# Patient Record
Sex: Male | Born: 1935 | Race: White | Hispanic: No | Smoking: Former smoker
Health system: Southern US, Community
[De-identification: ages and names within clinical notes are randomized; demographics above are authoritative.]

## PROBLEM LIST (undated history)

## (undated) DIAGNOSIS — I10 Essential (primary) hypertension: Secondary | ICD-10-CM

## (undated) DIAGNOSIS — K219 Gastro-esophageal reflux disease without esophagitis: Secondary | ICD-10-CM

## (undated) DIAGNOSIS — K5900 Constipation, unspecified: Secondary | ICD-10-CM

## (undated) DIAGNOSIS — N400 Enlarged prostate without lower urinary tract symptoms: Secondary | ICD-10-CM

## (undated) DIAGNOSIS — E119 Type 2 diabetes mellitus without complications: Secondary | ICD-10-CM

## (undated) HISTORY — DX: Type 2 diabetes mellitus without complications: E11.9

## (undated) HISTORY — DX: Essential (primary) hypertension: I10

## (undated) HISTORY — PX: HERNIA REPAIR: SHX51

## (undated) HISTORY — DX: Benign prostatic hyperplasia without lower urinary tract symptoms: N40.0

## (undated) HISTORY — DX: Constipation, unspecified: K59.00

## (undated) HISTORY — PX: HIP SURGERY: SHX245

## (undated) HISTORY — DX: Gastro-esophageal reflux disease without esophagitis: K21.9

---

## 2013-05-02 ENCOUNTER — Encounter: Payer: Self-pay | Admitting: Internal Medicine

## 2013-05-03 ENCOUNTER — Non-Acute Institutional Stay (SKILLED_NURSING_FACILITY): Payer: Medicare Other | Admitting: Nurse Practitioner

## 2013-05-03 ENCOUNTER — Encounter: Payer: Self-pay | Admitting: Nurse Practitioner

## 2013-05-03 DIAGNOSIS — J111 Influenza due to unidentified influenza virus with other respiratory manifestations: Secondary | ICD-10-CM | POA: Insufficient documentation

## 2013-05-03 DIAGNOSIS — R1310 Dysphagia, unspecified: Secondary | ICD-10-CM

## 2013-05-03 DIAGNOSIS — B952 Enterococcus as the cause of diseases classified elsewhere: Secondary | ICD-10-CM

## 2013-05-03 DIAGNOSIS — I110 Hypertensive heart disease with heart failure: Secondary | ICD-10-CM | POA: Insufficient documentation

## 2013-05-03 DIAGNOSIS — N400 Enlarged prostate without lower urinary tract symptoms: Secondary | ICD-10-CM

## 2013-05-03 DIAGNOSIS — R404 Transient alteration of awareness: Secondary | ICD-10-CM

## 2013-05-03 DIAGNOSIS — N179 Acute kidney failure, unspecified: Secondary | ICD-10-CM

## 2013-05-03 DIAGNOSIS — E119 Type 2 diabetes mellitus without complications: Secondary | ICD-10-CM

## 2013-05-03 DIAGNOSIS — K5900 Constipation, unspecified: Secondary | ICD-10-CM | POA: Insufficient documentation

## 2013-05-03 DIAGNOSIS — R7881 Bacteremia: Secondary | ICD-10-CM | POA: Insufficient documentation

## 2013-05-03 DIAGNOSIS — I10 Essential (primary) hypertension: Secondary | ICD-10-CM

## 2013-05-03 DIAGNOSIS — R400 Somnolence: Secondary | ICD-10-CM

## 2013-05-03 DIAGNOSIS — K219 Gastro-esophageal reflux disease without esophagitis: Secondary | ICD-10-CM

## 2013-05-03 DIAGNOSIS — J189 Pneumonia, unspecified organism: Secondary | ICD-10-CM

## 2013-05-03 DIAGNOSIS — N390 Urinary tract infection, site not specified: Secondary | ICD-10-CM

## 2013-05-03 NOTE — Progress Notes (Signed)
Patient ID: Riley Martinez, male   DOB: 1914-06-10, 80 y.o.   MRN: [id number]    Nursing Home Location:  [facility]   Place of Service: SNF (31)  PCP: No primary provider on file.  Not on File  Chief Complaint  Patient presents with  . Hospitalization Follow-up    HPI:  -80 year old male with a PMH of HTN, BPH (s/p TURP) GERD, who was hospitalized for severe sepsis in the setting of enterococcus septicemia, influenza A, pneumonia and UTI; pt was followed by ID during hospitalization and is now receiving IV Unasyn until the 11th. Pt requiring Bipap after respiratory distress due to pulmonary edema and was treated with IV lasix. Noted that pt had increased somnolence after ativan during hospitalization, pt was previously on remeron and melatonin prior to hospitalization, hospital increase remeron at discharge and now  staff report pt have not been fully awake since he was admitted but stayed awake all night with only brief episodes of sleeping. Now staff concerns about pt being so sleepy. No fevers or chills Review of Systems:  Unable to obtain   Past Medical History  Diagnosis Date  . GERD (gastroesophageal reflux disease)   . Hypertension   . BPH (benign prostatic hyperplasia)   . Diabetes mellitus without complication   . Constipation    No past surgical history on file. Social History:   has no tobacco, alcohol, and drug history on file.  No family history on file.  Medications: Patient's Medications  New Prescriptions   No medications on file  Previous Medications   ACETAMINOPHEN (TYLENOL) 500 MG CHEWABLE TABLET    Chew 500 mg by mouth every 6 (six) hours as needed for pain.   ACETAMINOPHEN (TYLENOL) 500 MG TABLET    Take 500 mg by mouth 3 (three) times daily.   ALBUTEROL (PROVENTIL) (2.5 MG/3ML) 0.083% NEBULIZER SOLUTION    Take 2.5 mg by nebulization every 6 (six) hours as needed for wheezing or shortness of breath.   AMPICILLIN-SULBACTAM (UNASYN) 3  (2-1) G INJECTION    Inject 3 g into the vein every 6 (six) hours.   ASPIRIN 81 MG TABLET    Take 81 mg by mouth daily.   BISACODYL (DULCOLAX) 10 MG SUPPOSITORY    Place 10 mg rectally daily. For 9 days   CARBAMIDE PEROXIDE (DEBROX) 6.5 % OTIC SOLUTION    Place 5 drops into both ears as needed.   CARVEDILOL (COREG) 3.125 MG TABLET    Take 3.125 mg by mouth 2 (two) times daily with a meal.   CLOTRIMAZOLE (LOTRIMIN) 1 % CREAM    Apply 1 application topically 2 (two) times daily.   CLOTRIMAZOLE-BETAMETHASONE (LOTRISONE) CREAM    Apply 1 application topically daily.   DESONIDE (DESOWEN) 0.05 % LOTION    Apply topically daily. Monday - Thursday - to face and ears   FLUOCINONIDE CREAM (LIDEX) 0.05 %    Apply 1 application topically 2 (two) times daily. Monday through Thursday to legs   FLUTICASONE (FLONASE) 50 MCG/ACT NASAL SPRAY    Place 1 spray into both nostrils daily.   FUROSEMIDE (LASIX) 20 MG TABLET    Take 20 mg by mouth every other day.   LACTOBACILLUS ACIDOPHILUS (BACID) TABS TABLET    Take 2 tablets by mouth 3 (three) times daily.   LISINOPRIL (PRINIVIL,ZESTRIL) 2.5 MG TABLET    Take 2.5 mg by mouth daily.   MIRTAZAPINE (REMERON) 15 MG TABLET    Take 15 mg  by mouth at bedtime.   OMEPRAZOLE (PRILOSEC) 20 MG CAPSULE    Take 20 mg by mouth daily.   POLYETHYLENE GLYCOL (MIRALAX / GLYCOLAX) PACKET    Take 17 g by mouth 2 (two) times daily as needed.   POLYVINYL ALCOHOL (LIQUIFILM TEARS) 1.4 % OPHTHALMIC SOLUTION    Place 2 drops into both eyes 3 (three) times daily.   SENNOSIDES-DOCUSATE SODIUM (SENOKOT-S) 8.6-50 MG TABLET    Take 2 tablets by mouth 2 (two) times daily.   TAMSULOSIN (FLOMAX) 0.4 MG CAPS CAPSULE    Take 0.4 mg by mouth daily.   TRAMADOL (ULTRAM) 50 MG TABLET    Take by mouth every 8 (eight) hours as needed.   VITAMIN B-12 (CYANOCOBALAMIN) 1000 MCG TABLET    Take 1,000 mcg by mouth daily.  Modified Medications   No medications on file  Discontinued Medications   No medications  on file     Physical Exam: Physical Exam  Constitutional: No distress.  Frail male in NAD  HENT:  Mouth/Throat: Oropharynx is clear and moist. No oropharyngeal exudate.  Eyes: Conjunctivae and EOM are normal. Pupils are equal, round, and reactive to light.  Cardiovascular: Normal rate, regular rhythm and normal heart sounds.   Pulmonary/Chest: Effort normal. He has wheezes.  Diminished   Abdominal: Soft. Bowel sounds are normal. He exhibits no distension.  Musculoskeletal: He exhibits no edema and no tenderness.  Neurological:  Lethargic, opens eyes to name, does not follow commands  Skin: Skin is warm and dry.    Filed Vitals:   05/03/13 1421  BP: 96/47  Pulse: 76  Temp: 98.4 F (36.9 C)  Resp: 24   04/27/13 -wbc 8.2, hgb 9.8, hct 32.3, plt 142 04/30/13-sodium 143,potassium  4.1,Co2 30, Cr 0.80, BUN 17    cxr on 04/26/13- chronic lung changes with superimposed mild edema 05/01/13- stable cardiomegaly, mild decrease in infiltrates/edema Assessment/Plan 1. GERD (gastroesophageal reflux disease) -not currently on medications 2. Hypertension -blood pressure soft; will hold BP medications for sbp <100 3. BPH (benign prostatic hyperplasia) -on Flomax 4. Diabetes mellitus without complication -was previously on medications but due to poor PO intake medications was dc'd 5. Constipation -has not had regular BMs, was started on bowel regimen in hospital  6. Acute renal failure -Renal function stabilized before dc from hosital 7. Influenza -completed tamiflu  8. Dysphagia - Fees completed and honey thick liquids were recommended 9. Sepsis due to Enterococcus UTI and pneumonia  -pos blood cultures in hospital; was initially treated with ceftriaxone and vanc but  Narrowed to unaysn- ID was following in hospital- recs for 14 days of Unasyn; to be dc's on 05/08/13 10. Somnolence  -pt with metabolic encephalopathy in hospital due to age, dementia, and infections; now with increase  lethargy but after a night of being awake most of the time; Pt with sleep wake cycle being off per staff and family, did not sleep last night today he has been up but now is very sleepy, does not follow commands but opens eyes when spoken too and attempts to answer questions, son reports he was like this in hospital after 1 dose of ativan; has not received any benzos or narcotics since he has been here from staff, family has not given him any medication.  Family concerned because he is not sleeping at night;  But due to somnolence will stop remeron (this was 7.5 and now at 15 mg) and restart at a later date  -will get stat labs, and  cxray and UAC&S to rule out other causes for pt being lethargic; pt conts on IV antibiotics will start IV fluids NS at 50 ccs an hour for 24 hours and follow up BMP after this is completed due to minimal PO intake since he has been at [facility]  -pt also with decreased breath sounds, will start duonebs q 8 hours for 5 days  -VS q 4 -will stop ultram and scheduled tylenol -conts PRN tylenol   -sons at bedside who were updated and aware of plan of care

## 2013-05-05 ENCOUNTER — Emergency Department (HOSPITAL_COMMUNITY): Payer: Medicare Other

## 2013-05-05 ENCOUNTER — Encounter (HOSPITAL_COMMUNITY): Payer: Self-pay | Admitting: Emergency Medicine

## 2013-05-05 ENCOUNTER — Inpatient Hospital Stay (HOSPITAL_COMMUNITY)
Admission: EM | Admit: 2013-05-05 | Discharge: 2013-05-15 | DRG: 314 | Disposition: A | Discharge status: Skilled Nursing Facility | Payer: Medicare Other | Attending: Internal Medicine | Admitting: Internal Medicine

## 2013-05-05 DIAGNOSIS — R1310 Dysphagia, unspecified: Secondary | ICD-10-CM

## 2013-05-05 DIAGNOSIS — Z9181 History of falling: Secondary | ICD-10-CM

## 2013-05-05 DIAGNOSIS — K5900 Constipation, unspecified: Secondary | ICD-10-CM

## 2013-05-05 DIAGNOSIS — I5023 Acute on chronic systolic (congestive) heart failure: Secondary | ICD-10-CM | POA: Diagnosis present

## 2013-05-05 DIAGNOSIS — J111 Influenza due to unidentified influenza virus with other respiratory manifestations: Secondary | ICD-10-CM

## 2013-05-05 DIAGNOSIS — Z7982 Long term (current) use of aspirin: Secondary | ICD-10-CM

## 2013-05-05 DIAGNOSIS — A419 Sepsis, unspecified organism: Secondary | ICD-10-CM | POA: Insufficient documentation

## 2013-05-05 DIAGNOSIS — Y92009 Unspecified place in unspecified non-institutional (private) residence as the place of occurrence of the external cause: Secondary | ICD-10-CM

## 2013-05-05 DIAGNOSIS — J189 Pneumonia, unspecified organism: Secondary | ICD-10-CM | POA: Diagnosis present

## 2013-05-05 DIAGNOSIS — N138 Other obstructive and reflux uropathy: Secondary | ICD-10-CM | POA: Diagnosis present

## 2013-05-05 DIAGNOSIS — M545 Low back pain, unspecified: Secondary | ICD-10-CM

## 2013-05-05 DIAGNOSIS — Z87891 Personal history of nicotine dependence: Secondary | ICD-10-CM

## 2013-05-05 DIAGNOSIS — Y849 Medical procedure, unspecified as the cause of abnormal reaction of the patient, or of later complication, without mention of misadventure at the time of the procedure: Secondary | ICD-10-CM | POA: Diagnosis present

## 2013-05-05 DIAGNOSIS — T80211A Bloodstream infection due to central venous catheter, initial encounter: Principal | ICD-10-CM | POA: Diagnosis present

## 2013-05-05 DIAGNOSIS — R7881 Bacteremia: Secondary | ICD-10-CM

## 2013-05-05 DIAGNOSIS — N401 Enlarged prostate with lower urinary tract symptoms: Secondary | ICD-10-CM | POA: Diagnosis present

## 2013-05-05 DIAGNOSIS — G9340 Encephalopathy, unspecified: Secondary | ICD-10-CM | POA: Diagnosis present

## 2013-05-05 DIAGNOSIS — E870 Hyperosmolality and hypernatremia: Secondary | ICD-10-CM

## 2013-05-05 DIAGNOSIS — I509 Heart failure, unspecified: Secondary | ICD-10-CM | POA: Diagnosis present

## 2013-05-05 DIAGNOSIS — N390 Urinary tract infection, site not specified: Secondary | ICD-10-CM

## 2013-05-05 DIAGNOSIS — R339 Retention of urine, unspecified: Secondary | ICD-10-CM | POA: Diagnosis present

## 2013-05-05 DIAGNOSIS — K219 Gastro-esophageal reflux disease without esophagitis: Secondary | ICD-10-CM

## 2013-05-05 DIAGNOSIS — E876 Hypokalemia: Secondary | ICD-10-CM | POA: Diagnosis present

## 2013-05-05 DIAGNOSIS — R52 Pain, unspecified: Secondary | ICD-10-CM | POA: Diagnosis present

## 2013-05-05 DIAGNOSIS — B952 Enterococcus as the cause of diseases classified elsewhere: Secondary | ICD-10-CM

## 2013-05-05 DIAGNOSIS — I10 Essential (primary) hypertension: Secondary | ICD-10-CM

## 2013-05-05 DIAGNOSIS — Z66 Do not resuscitate: Secondary | ICD-10-CM | POA: Diagnosis present

## 2013-05-05 DIAGNOSIS — E119 Type 2 diabetes mellitus without complications: Secondary | ICD-10-CM

## 2013-05-05 DIAGNOSIS — Z792 Long term (current) use of antibiotics: Secondary | ICD-10-CM

## 2013-05-05 DIAGNOSIS — N400 Enlarged prostate without lower urinary tract symptoms: Secondary | ICD-10-CM

## 2013-05-05 DIAGNOSIS — B49 Unspecified mycosis: Secondary | ICD-10-CM

## 2013-05-05 DIAGNOSIS — Z7401 Bed confinement status: Secondary | ICD-10-CM

## 2013-05-05 DIAGNOSIS — D649 Anemia, unspecified: Secondary | ICD-10-CM | POA: Diagnosis present

## 2013-05-05 DIAGNOSIS — R509 Fever, unspecified: Secondary | ICD-10-CM | POA: Diagnosis present

## 2013-05-05 LAB — URINALYSIS, ROUTINE W REFLEX MICROSCOPIC
BILIRUBIN URINE: NEGATIVE
Glucose, UA: NEGATIVE mg/dL
KETONES UR: 15 mg/dL — AB
NITRITE: NEGATIVE
PH: 5.5 (ref 5.0–8.0)
Protein, ur: 30 mg/dL — AB
Specific Gravity, Urine: 1.018 (ref 1.005–1.030)
Urobilinogen, UA: 0.2 mg/dL (ref 0.0–1.0)

## 2013-05-05 LAB — URINE MICROSCOPIC-ADD ON

## 2013-05-05 LAB — CBC WITH DIFFERENTIAL/PLATELET
Basophils Absolute: 0 10*3/uL (ref 0.0–0.1)
Basophils Relative: 0 % (ref 0–1)
EOS ABS: 0 10*3/uL (ref 0.0–0.7)
Eosinophils Relative: 1 % (ref 0–5)
HCT: 25.7 % — ABNORMAL LOW (ref 39.0–52.0)
HEMOGLOBIN: 8.1 g/dL — AB (ref 13.0–17.0)
LYMPHS ABS: 0.9 10*3/uL (ref 0.7–4.0)
Lymphocytes Relative: 10 % — ABNORMAL LOW (ref 12–46)
MCH: 21.2 pg — AB (ref 26.0–34.0)
MCHC: 31.5 g/dL (ref 30.0–36.0)
MCV: 67.3 fL — ABNORMAL LOW (ref 78.0–100.0)
MONOS PCT: 6 % (ref 3–12)
Monocytes Absolute: 0.6 10*3/uL (ref 0.1–1.0)
Neutro Abs: 7.3 10*3/uL (ref 1.7–7.7)
Neutrophils Relative %: 83 % — ABNORMAL HIGH (ref 43–77)
Platelets: 252 10*3/uL (ref 150–400)
RBC: 3.82 MIL/uL — AB (ref 4.22–5.81)
RDW: 14.9 % (ref 11.5–15.5)
WBC: 8.7 10*3/uL (ref 4.0–10.5)

## 2013-05-05 LAB — COMPREHENSIVE METABOLIC PANEL
ALBUMIN: 2.3 g/dL — AB (ref 3.5–5.2)
ALK PHOS: 34 U/L — AB (ref 39–117)
ALT: 14 U/L (ref 0–53)
AST: 11 U/L (ref 0–37)
BILIRUBIN TOTAL: 0.8 mg/dL (ref 0.3–1.2)
BUN: 36 mg/dL — ABNORMAL HIGH (ref 6–23)
CHLORIDE: 109 meq/L (ref 96–112)
CO2: 24 meq/L (ref 19–32)
Calcium: 7.9 mg/dL — ABNORMAL LOW (ref 8.4–10.5)
Creatinine, Ser: 1.28 mg/dL (ref 0.50–1.35)
GFR calc non Af Amer: 45 mL/min — ABNORMAL LOW (ref >90)
GFR, EST AFRICAN AMERICAN: 53 mL/min — AB (ref >90)
Glucose, Bld: 132 mg/dL — ABNORMAL HIGH (ref 70–99)
POTASSIUM: 3.1 meq/L — AB (ref 3.7–5.3)
Sodium: 150 mEq/L — ABNORMAL HIGH (ref 137–147)
Total Protein: 6.1 g/dL (ref 6.0–8.3)

## 2013-05-05 LAB — LACTIC ACID, PLASMA: Lactic Acid, Venous: 1 mmol/L (ref 0.5–2.2)

## 2013-05-05 LAB — GLUCOSE, CAPILLARY: Glucose-Capillary: 128 mg/dL — ABNORMAL HIGH (ref 70–99)

## 2013-05-05 MED ORDER — TRAMADOL HCL 50 MG PO TABS
50.0000 mg | ORAL_TABLET | Freq: Three times a day (TID) | ORAL | Status: DC | PRN
  Administered 2013-05-06 – 2013-05-09 (×6): 50 mg via ORAL
  Filled 2013-05-05 (×10): qty 1

## 2013-05-05 MED ORDER — SODIUM CHLORIDE 0.9 % IJ SOLN
10.0000 mL | INTRAMUSCULAR | Status: DC | PRN
Start: 2013-05-05 — End: 2013-05-15
  Administered 2013-05-06: 10 mL
  Administered 2013-05-06 – 2013-05-07 (×3): 20 mL
  Administered 2013-05-08 – 2013-05-09 (×2): 10 mL

## 2013-05-05 MED ORDER — PANTOPRAZOLE SODIUM 40 MG PO TBEC
40.0000 mg | DELAYED_RELEASE_TABLET | Freq: Every day | ORAL | Status: DC
Start: 2013-05-06 — End: 2013-05-09
  Administered 2013-05-06 – 2013-05-09 (×4): 40 mg via ORAL
  Filled 2013-05-05 (×4): qty 1

## 2013-05-05 MED ORDER — ACETAMINOPHEN 325 MG PO TABS
650.0000 mg | ORAL_TABLET | Freq: Four times a day (QID) | ORAL | Status: DC | PRN
Start: 2013-05-05 — End: 2013-05-15
  Administered 2013-05-06 – 2013-05-12 (×8): 650 mg via ORAL
  Filled 2013-05-05 (×10): qty 2

## 2013-05-05 MED ORDER — SODIUM CHLORIDE 0.9 % IV BOLUS (SEPSIS)
500.0000 mL | Freq: Once | INTRAVENOUS | Status: AC
  Administered 2013-05-05: 500 mL via INTRAVENOUS

## 2013-05-05 MED ORDER — POLYVINYL ALCOHOL 1.4 % OP SOLN
2.0000 [drp] | Freq: Three times a day (TID) | OPHTHALMIC | Status: DC
  Administered 2013-05-06 – 2013-05-15 (×26): 2 [drp] via OPHTHALMIC
  Filled 2013-05-05 (×2): qty 15

## 2013-05-05 MED ORDER — DEXTROSE 5 % IV SOLN: 2.0000 g | INTRAVENOUS | Status: DC

## 2013-05-05 MED ORDER — PIPERACILLIN-TAZOBACTAM 3.375 G IVPB
3.3750 g | Freq: Three times a day (TID) | INTRAVENOUS | Status: DC
  Administered 2013-05-06 – 2013-05-07 (×5): 3.375 g via INTRAVENOUS
  Filled 2013-05-05 (×7): qty 50

## 2013-05-05 MED ORDER — MAGNESIUM SULFATE 40 MG/ML IJ SOLN: 2.0000 g | Freq: Once | INTRAMUSCULAR | Status: DC

## 2013-05-05 MED ORDER — CLOTRIMAZOLE 1 % EX CREA
1.0000 "application " | TOPICAL_CREAM | Freq: Two times a day (BID) | CUTANEOUS | Status: DC
  Administered 2013-05-06 – 2013-05-15 (×19): 1 via TOPICAL
  Filled 2013-05-05: qty 15

## 2013-05-05 MED ORDER — ACETAMINOPHEN 650 MG RE SUPP
650.0000 mg | Freq: Four times a day (QID) | RECTAL | Status: DC | PRN
Start: 2013-05-05 — End: 2013-05-14
  Administered 2013-05-06 – 2013-05-09 (×2): 650 mg via RECTAL
  Filled 2013-05-05 (×2): qty 1

## 2013-05-05 MED ORDER — FLUOCINONIDE 0.05 % EX CREA
1.0000 "application " | TOPICAL_CREAM | CUTANEOUS | Status: DC
  Filled 2013-05-05: qty 30

## 2013-05-05 MED ORDER — VANCOMYCIN HCL IN DEXTROSE 750-5 MG/150ML-% IV SOLN
750.0000 mg | INTRAVENOUS | Status: DC
  Administered 2013-05-06: 750 mg via INTRAVENOUS
  Filled 2013-05-05 (×2): qty 150

## 2013-05-05 MED ORDER — SENNA-DOCUSATE SODIUM 8.6-50 MG PO TABS
2.0000 | ORAL_TABLET | Freq: Two times a day (BID) | ORAL | Status: DC
  Administered 2013-05-06 – 2013-05-14 (×15): 2 via ORAL
  Filled 2013-05-05 (×21): qty 2

## 2013-05-05 MED ORDER — POTASSIUM CHLORIDE IN NACL 20-0.45 MEQ/L-% IV SOLN
INTRAVENOUS | Status: DC
  Administered 2013-05-05: via INTRAVENOUS
  Filled 2013-05-05 (×2): qty 1000

## 2013-05-05 MED ORDER — VITAMIN B-12 1000 MCG PO TABS
1000.0000 ug | ORAL_TABLET | Freq: Every day | ORAL | Status: DC
  Administered 2013-05-06 – 2013-05-15 (×10): 1000 ug via ORAL
  Filled 2013-05-05 (×10): qty 1

## 2013-05-05 MED ORDER — SODIUM CHLORIDE 0.9 % IJ SOLN
3.0000 mL | Freq: Two times a day (BID) | INTRAMUSCULAR | Status: DC
  Administered 2013-05-06 – 2013-05-14 (×6): 3 mL via INTRAVENOUS

## 2013-05-05 MED ORDER — POLYETHYLENE GLYCOL 3350 17 G PO PACK
17.0000 g | PACK | Freq: Every day | ORAL | Status: DC | PRN
  Administered 2013-05-06: 17 g via ORAL
  Filled 2013-05-05: qty 1

## 2013-05-05 MED ORDER — ASPIRIN 81 MG PO TABS: 81.0000 mg | ORAL_TABLET | Freq: Every day | ORAL | Status: DC

## 2013-05-05 MED ORDER — ENOXAPARIN SODIUM 40 MG/0.4ML ~~LOC~~ SOLN
40.0000 mg | SUBCUTANEOUS | Status: DC
  Administered 2013-05-06 – 2013-05-15 (×10): 40 mg via SUBCUTANEOUS
  Filled 2013-05-05 (×10): qty 0.4

## 2013-05-05 MED ORDER — ASPIRIN EC 81 MG PO TBEC
81.0000 mg | DELAYED_RELEASE_TABLET | Freq: Every day | ORAL | Status: DC
  Administered 2013-05-06 – 2013-05-15 (×10): 81 mg via ORAL
  Filled 2013-05-05 (×10): qty 1

## 2013-05-05 MED ORDER — ACETAMINOPHEN 650 MG RE SUPP
650.0000 mg | Freq: Once | RECTAL | Status: AC
  Administered 2013-05-05: 650 mg via RECTAL
  Filled 2013-05-05: qty 1

## 2013-05-05 MED ORDER — VANCOMYCIN HCL 10 G IV SOLR
1500.0000 mg | Freq: Once | INTRAVENOUS | Status: AC
  Administered 2013-05-05: 1500 mg via INTRAVENOUS
  Filled 2013-05-05: qty 1500

## 2013-05-05 MED ORDER — ALBUTEROL SULFATE (2.5 MG/3ML) 0.083% IN NEBU: 2.5000 mg | INHALATION_SOLUTION | Freq: Three times a day (TID) | RESPIRATORY_TRACT | Status: DC | PRN

## 2013-05-05 MED ORDER — FLUTICASONE PROPIONATE 50 MCG/ACT NA SUSP
1.0000 | Freq: Every day | NASAL | Status: DC
  Administered 2013-05-06 – 2013-05-15 (×10): 1 via NASAL
  Filled 2013-05-05: qty 16

## 2013-05-05 MED ORDER — ONDANSETRON HCL 4 MG PO TABS: 4.0000 mg | ORAL_TABLET | Freq: Four times a day (QID) | ORAL | Status: DC | PRN

## 2013-05-05 MED ORDER — DEXTROSE 5 % IV SOLN
2.0000 g | Freq: Once | INTRAVENOUS | Status: AC
  Administered 2013-05-05: 2 g via INTRAVENOUS

## 2013-05-05 MED ORDER — BACID PO TABS
1.0000 | ORAL_TABLET | Freq: Three times a day (TID) | ORAL | Status: DC
  Administered 2013-05-06 – 2013-05-15 (×27): 1 via ORAL
  Filled 2013-05-05 (×31): qty 1

## 2013-05-05 MED ORDER — INSULIN ASPART 100 UNIT/ML ~~LOC~~ SOLN
0.0000 [IU] | Freq: Three times a day (TID) | SUBCUTANEOUS | Status: DC
  Administered 2013-05-06 – 2013-05-07 (×3): 1 [IU] via SUBCUTANEOUS
  Administered 2013-05-07: 2 [IU] via SUBCUTANEOUS
  Administered 2013-05-07: 1 [IU] via SUBCUTANEOUS
  Administered 2013-05-08: 2 [IU] via SUBCUTANEOUS
  Administered 2013-05-09 (×2): 1 [IU] via SUBCUTANEOUS
  Administered 2013-05-09: 2 [IU] via SUBCUTANEOUS
  Administered 2013-05-11 – 2013-05-13 (×3): 1 [IU] via SUBCUTANEOUS

## 2013-05-05 MED ORDER — ONDANSETRON HCL 4 MG/2ML IJ SOLN
4.0000 mg | Freq: Four times a day (QID) | INTRAMUSCULAR | Status: DC | PRN
  Administered 2013-05-13 – 2013-05-15 (×2): 4 mg via INTRAVENOUS
  Filled 2013-05-05 (×2): qty 2

## 2013-05-05 MED ORDER — FLUOCINONIDE-E 0.05 % EX CREA
TOPICAL_CREAM | CUTANEOUS | Status: DC
  Administered 2013-05-06 – 2013-05-15 (×13): via TOPICAL
  Filled 2013-05-05: qty 15

## 2013-05-05 MED ORDER — TAMSULOSIN HCL 0.4 MG PO CAPS
0.4000 mg | ORAL_CAPSULE | Freq: Every day | ORAL | Status: DC
  Administered 2013-05-06 – 2013-05-08 (×3): 0.4 mg via ORAL
  Filled 2013-05-05 (×6): qty 1

## 2013-05-05 NOTE — ED Notes (Signed)
MD at bedside. 

## 2013-05-05 NOTE — ED Provider Notes (Signed)
CSN: [id number]     Arrival date & time 05/05/13  1718 History   First MD Initiated Contact with Patient 05/05/13 1734     Chief Complaint  Patient presents with  . Altered Mental Status    HPI: Riley Martinez is a 80 yo M with history of CHF, HTN, DM, recently discharged from [facility], after being treated for enterococcus bacteremia, who presents with alerted mental status. He was admitted to [facility] for sepsis, ultimately discovered to be enterococcus, he is currently on IV unasyn through a R UE PICC line. He was also diagnosed with influenza A and completed a course of Tamiflu. His hospital course was complicated by volume overload requiring BiPAP and ICU admission but no intubation. He has been at the rehab facility for three days. Since yesterday he has not been eating or drinking fluids normally. Today he was less responsive than normal, noted to have fever to 101 so he was sent to the ED for evaluation. His family has not noted any vomiting, diarrhea, coughing or trouble breathing. He does have chronic bilateral LE redness, worse on right than left.    Past Medical History  Diagnosis Date  . GERD (gastroesophageal reflux disease)   . Hypertension   . BPH (benign prostatic hyperplasia)   . Diabetes mellitus without complication   . Constipation    History reviewed. No pertinent past surgical history. History reviewed. No pertinent family history. History  Substance Use Topics  . Smoking status: Not on file  . Smokeless tobacco: Not on file  . Alcohol Use: No    Review of Systems  Unable to perform ROS: Mental status change    Allergies  Ativan  Home Medications   Current Outpatient Rx  Name  Route  Sig  Dispense  Refill  . acetaminophen (TYLENOL) 500 MG chewable tablet   Oral   Chew 500 mg by mouth every 6 (six) hours as needed for pain.         Marland Kitchen acetaminophen (TYLENOL) 500 MG tablet   Oral   Take 500 mg by mouth 3 (three) times daily.         Marland Kitchen albuterol  (PROVENTIL) (2.5 MG/3ML) 0.083% nebulizer solution   Nebulization   Take 2.5 mg by nebulization every 6 (six) hours as needed for wheezing or shortness of breath.         Marland Kitchen ampicillin-sulbactam (UNASYN) 3 (2-1) G injection   Intravenous   Inject 3 g into the vein every 6 (six) hours.         Marland Kitchen aspirin 81 MG tablet   Oral   Take 81 mg by mouth daily.         . bisacodyl (DULCOLAX) 10 MG suppository   Rectal   Place 10 mg rectally daily. For 9 days         . carbamide peroxide (DEBROX) 6.5 % otic solution   Both Ears   Place 5 drops into both ears as needed.         . carvedilol (COREG) 3.125 MG tablet   Oral   Take 3.125 mg by mouth 2 (two) times daily with a meal.         . clotrimazole (LOTRIMIN) 1 % cream   Topical   Apply 1 application topically 2 (two) times daily.         . clotrimazole-betamethasone (LOTRISONE) cream   Topical   Apply 1 application topically daily.         Marland Kitchen  desonide (DESOWEN) 0.05 % lotion   Topical   Apply topically daily. Monday - Thursday - to face and ears         . fluocinonide cream (LIDEX) 0.05 %   Topical   Apply 1 application topically 2 (two) times daily. Monday through Thursday to legs         . fluticasone (FLONASE) 50 MCG/ACT nasal spray   Each Nare   Place 1 spray into both nostrils daily.         . furosemide (LASIX) 20 MG tablet   Oral   Take 20 mg by mouth every other day.         . lactobacillus acidophilus (BACID) TABS tablet   Oral   Take 2 tablets by mouth 3 (three) times daily.         Marland Kitchen lisinopril (PRINIVIL,ZESTRIL) 2.5 MG tablet   Oral   Take 2.5 mg by mouth daily.         . mirtazapine (REMERON) 15 MG tablet   Oral   Take 15 mg by mouth at bedtime.         Marland Kitchen omeprazole (PRILOSEC) 20 MG capsule   Oral   Take 20 mg by mouth daily.         . polyethylene glycol (MIRALAX / GLYCOLAX) packet   Oral   Take 17 g by mouth 2 (two) times daily as needed.         . polyvinyl  alcohol (LIQUIFILM TEARS) 1.4 % ophthalmic solution   Both Eyes   Place 2 drops into both eyes 3 (three) times daily.         . sennosides-docusate sodium (SENOKOT-S) 8.6-50 MG tablet   Oral   Take 2 tablets by mouth 2 (two) times daily.         . tamsulosin (FLOMAX) 0.4 MG CAPS capsule   Oral   Take 0.4 mg by mouth daily.         . traMADol (ULTRAM) 50 MG tablet   Oral   Take by mouth every 8 (eight) hours as needed.         . vitamin B-12 (CYANOCOBALAMIN) 1000 MCG tablet   Oral   Take 1,000 mcg by mouth daily.          BP 114/45  Pulse 76  Resp 22  SpO2 93% Physical Exam  Nursing note and vitals reviewed. Constitutional: He appears lethargic. He appears ill. No distress.  Chronically ill appearing, elderly male, laying in bed, somnolent but arouses to voice.   HENT:  Head: Normocephalic and atraumatic.  Mouth/Throat: Oropharynx is clear and moist. Mucous membranes are dry.  Eyes: Conjunctivae and EOM are normal. Pupils are equal, round, and reactive to light.  Neck: Normal range of motion. Neck supple.  Cardiovascular: Normal rate, regular rhythm and intact distal pulses.  Exam reveals distant heart sounds.   Pulmonary/Chest: Effort normal. No respiratory distress. He has rales (bilateral bases).  Abdominal: Soft. Bowel sounds are normal. There is no tenderness. There is no rebound and no guarding.  Musculoskeletal: Normal range of motion. He exhibits no edema and no tenderness.  Chronic redness of right lower extremity. Not warm or tender. NO surrounding swelling  PICC line in R UE. No surrounding erythema or drainage.   Neurological: He appears lethargic. He is disoriented. GCS eye subscore is 4. GCS verbal subscore is 4. GCS motor subscore is 6.  Neurologic exam limited by mental status. Despite these limitations, no gross  neurologic deficit appreciated. He moves all extremities. Symmetric smile.   Skin: Skin is warm and dry. No rash noted.    ED Course   Procedures (including critical care time) Labs Review Labs Reviewed  CBC WITH DIFFERENTIAL - Abnormal; Notable for the following:    RBC 3.82 (*)    Hemoglobin 8.1 (*)    HCT 25.7 (*)    MCV 67.3 (*)    MCH 21.2 (*)    Neutrophils Relative % 83 (*)    Lymphocytes Relative 10 (*)    All other components within normal limits  COMPREHENSIVE METABOLIC PANEL - Abnormal; Notable for the following:    Sodium 150 (*)    Potassium 3.1 (*)    Glucose, Bld 132 (*)    BUN 36 (*)    Calcium 7.9 (*)    Albumin 2.3 (*)    Alkaline Phosphatase 34 (*)    GFR calc non Af Amer 45 (*)    GFR calc Af Amer 53 (*)    All other components within normal limits  URINALYSIS, ROUTINE W REFLEX MICROSCOPIC - Abnormal; Notable for the following:    APPearance CLOUDY (*)    Hgb urine dipstick LARGE (*)    Ketones, ur 15 (*)    Protein, ur 30 (*)    Leukocytes, UA SMALL (*)    All other components within normal limits  URINE MICROSCOPIC-ADD ON - Abnormal; Notable for the following:    Bacteria, UA FEW (*)    All other components within normal limits  URINE CULTURE  CULTURE, BLOOD (ROUTINE X 2)  CULTURE, BLOOD (ROUTINE X 2)  LACTIC ACID, PLASMA  INFLUENZA PANEL BY PCR (TYPE A & B, H1N1)   Imaging Review Ct Head Wo Contrast  05/05/2013   CLINICAL DATA:  Altered mental status.  EXAM: CT HEAD WITHOUT CONTRAST  TECHNIQUE: Contiguous axial images were obtained from the base of the skull through the vertex without intravenous contrast.  COMPARISON:  None.  FINDINGS: There is atrophy and chronic small vessel disease changes. Small old lacunar infarct in the right basal ganglia. No acute intracranial abnormality. Specifically, no hemorrhage, hydrocephalus, mass lesion, acute infarction, or significant intracranial injury. No acute calvarial abnormality.  Mucosal thickening within the ethmoid air cells and nasal cavity. Mastoid air cells are clear. Orbital soft tissues are unremarkable.  IMPRESSION: No acute  intracranial abnormality.  Atrophy, chronic microvascular disease.   Electronically Signed   By: Charlett Nose M.D.   On: 05/05/2013 20:27   Dg Chest Portable 1 View  05/05/2013   CLINICAL DATA:  Altered mental status since 2 o'clock today. Fever. Sepsis last week. CHF. Hypertension. Diabetes.  EXAM: PORTABLE CHEST - 1 VIEW  COMPARISON:  None.  FINDINGS: Degraded exam secondary to AP portable technique and patient chin overlying the right apex. A right-sided PICC line terminates at cavoatrial junction versus high right atrium.  Advanced glenohumeral joint osteoarthritis, primarily on the right.  Cardiomegaly accentuated by AP portable technique. No pleural fluid. No gross pneumothorax. Low lung volumes with resultant pulmonary interstitial prominence. Right greater the left bibasilar airspace disease.  IMPRESSION: Low lung volumes with right greater the left bibasilar airspace disease. Likely atelectasis. Early infection is difficult to exclude on the right.  Decreased sensitivity and specificity exam due to technique related factors, as described above.   Electronically Signed   By: Jeronimo Greaves M.D.   On: 05/05/2013 18:43      MDM   80 yo M with history  of CHF, DM who was recently discharged to rehab facility three days ago after being treated for enterococcus bacteremia. Currently on unasyn IV. He is febrile to 101 rectally, mild hypotension 114/45 and mild hypoxia 93% on 3 L. Given he meets SIRS criteria he was covered empirically with Vanc and cefepime. Altered mental status likely due to likely infectious cause, obtained head CT which was negative for acute abnormality. Given his poor EF, he was given gentle fluid bolus with improvement of his BP. Lactic acid is reassuring, currently 1. Labs c/w poor intake given K of 3.1, Na 150, BUN 36, Cr 1.28. Hgb is 8.1, WBC 8.7. UA not infected. CXR with bilateral opacities which may reflect PNA. He continued to protect his airway while in the ED, remained  lethargic but arouses to voice and follows commands. Given hypoxia and mental status he was admitted to Hospitalist service. I spoke to the patient and his son about work up and need for admission, they were in agreement with plan.   Reviewed imaging, labs, ECG and previous medical records, utilized in MDM  Discussed case with Dr. Romeo AppleHarrison  Clinical Impression 1. Sepsis secondary to HCAP 2. AMS due to sepsis 3. Volume depletion.     Margie BilletMathias Jourdan Maldonado, MD 05/07/13 [phone]

## 2013-05-05 NOTE — ED Notes (Signed)
Family at bedside. 

## 2013-05-05 NOTE — Progress Notes (Signed)
ANTIBIOTIC CONSULT NOTE - INITIAL  Pharmacy Consult for cefepime Indication: empiric  Allergies  Allergen Reactions  . Ativan [Lorazepam]     Vital Signs: BP: 114/45 mmHg (02/08 1739) Pulse Rate: 76 (02/08 1739) Intake/Output from previous day:   Intake/Output from this shift:    Labs: No results found for this basename: WBC, HGB, PLT, LABCREA, CREATININE,  in the last 72 hours CrCl is unknown because no creatinine reading has been taken and the patient has no height on file. No results found for this basename: VANCOTROUGH, VANCOPEAK, VANCORANDOM, GENTTROUGH, GENTPEAK, GENTRANDOM, TOBRATROUGH, TOBRAPEAK, TOBRARND, AMIKACINPEAK, AMIKACINTROU, AMIKACIN,  in the last 72 hours   Microbiology: No results found for this or any previous visit (from the past 720 hour(s)).  Medical History: Past Medical History  Diagnosis Date  . GERD (gastroesophageal reflux disease)   . Hypertension   . BPH (benign prostatic hyperplasia)   . Diabetes mellitus without complication   . Constipation      Assessment: Patient is a 80 y.o. M [facility] secondary to AMS.  She was [facility] a couple of weeks ago for sepsis.  To start cefepime for broad empiric coverage. Scr 1.28 (est crcl~ 34)  Plan:  1) cefepime 2gm IV q24h  Jayr Lupercio P 05/05/2013,5:56 PM

## 2013-05-05 NOTE — H&P (Addendum)
[facility] History and Physical  Daren Yeagle ZOX:[id number] DOB: [birth year] DOA: 05/05/2013  Referring physician: ER physician. PCP: No primary provider on file.   History of pain from ER physician and patient's son.  Chief Complaint: Altered mental status.  HPI: Riley Martinez is a 80 y.o. male who was recently admitted at [facility] with sepsis secondary to enterococcus UTI, at that time patient also was found to have decompensated CHF and was eventually discharged to rehabilitation. Patient was discharged on Thursday 4 days ago. Today patient was found to be unresponsive and was brought to the ER. Patient in the ER and CT head which was unremarkable. Labs revealed hypernatremia and hypokalemia with leukocytosis and anemia. Chest x-ray shows possible infiltrates and patient was found to be febrile. Initially on arrival patient was found to be hypotensive as per the ER physician and was given 500 cc normal saline bolus. Patient's son states that after patient was brought to the ER had received some fluid patient has become more alert. On my exam patient is responding to his name and following commands. Per patient's son patient prior to recent admission at [facility] used to be ambulatory. Patient did not have any nausea vomiting abdominal pain shortness of breath. Has been having off and on cough which was nonproductive. Did not have any chest pain. Patient's son states that during the stay at [facility] patient also was treated for influenza pneumonia with Tamiflu. Patient has not been eating well for last few days.  Review of Systems: As presented in the history of presenting illness, rest negative.  Past Medical History  Diagnosis Date  . GERD (gastroesophageal reflux disease)   . Hypertension   . BPH (benign prostatic hyperplasia)   . Diabetes mellitus without complication   . Constipation    Past Surgical History  Procedure Laterality Date   . Hip surgery    . Hernia repair     Social History:  reports that he has quit smoking. He does not have any smokeless tobacco history on file. He reports that he drinks alcohol. He reports that he does not use illicit drugs. Where does patient live  nursing home.  Can patient participate in ADLs?  yes.   Allergies  Allergen Reactions  . Ativan [Lorazepam] Other (See Comments)    unknown    Family History:  Family History  Problem Relation Age of Onset  . Diabetes Mellitus II Mother       Prior to Admission medications   Medication Sig Start Date End Date Taking? Authorizing Provider  acetaminophen (TYLENOL) 500 MG tablet Take 1,000 mg by mouth every 6 (six) hours as needed for mild pain.    Yes Historical Provider, MD  albuterol (PROVENTIL) (2.5 MG/3ML) 0.083% nebulizer solution Take 2.5 mg by nebulization every 8 (eight) hours as needed for wheezing or shortness of breath.    Yes Historical Provider, MD  ampicillin-sulbactam (UNASYN) 3 (2-1) G injection Inject 3 g into the vein every 6 (six) hours.   Yes Historical Provider, MD  aspirin 81 MG tablet Take 81 mg by mouth daily.   Yes Historical Provider, MD  bisacodyl (DULCOLAX) 10 MG suppository Place 10 mg rectally daily. For 9 days   Yes Historical Provider, MD  carbamide peroxide (DEBROX) 6.5 % otic solution Place 5 drops into both ears daily as needed (cerum impaction).    Yes Historical Provider, MD  carvedilol (COREG) 3.125 MG tablet Take 3.125 mg by mouth 2 (two) times  daily with a meal.   Yes Historical Provider, MD  clotrimazole (LOTRIMIN) 1 % cream Apply 1 application topically 2 (two) times daily.   Yes Historical Provider, MD  desonide (DESOWEN) 0.05 % lotion Apply 1 application topically daily. Monday - Thursday - to face and ears   Yes Historical Provider, MD  fluocinonide cream (LIDEX) 0.05 % Apply 1 application topically 2 (two) times daily. Monday through Thursday to legs   Yes Historical Provider, MD  fluticasone  (FLONASE) 50 MCG/ACT nasal spray Place 1 spray into both nostrils daily.   Yes Historical Provider, MD  furosemide (LASIX) 20 MG tablet Take 20 mg by mouth every other day.   Yes Historical Provider, MD  lactobacillus acidophilus (BACID) TABS tablet Take 1 tablet by mouth 3 (three) times daily.    Yes Historical Provider, MD  lisinopril (PRINIVIL,ZESTRIL) 2.5 MG tablet Take 2.5 mg by mouth daily.   Yes Historical Provider, MD  mirtazapine (REMERON) 15 MG tablet Take 15 mg by mouth at bedtime.   Yes Historical Provider, MD  omeprazole (PRILOSEC) 20 MG capsule Take 20 mg by mouth daily.   Yes Historical Provider, MD  polyethylene glycol (MIRALAX / GLYCOLAX) packet Take 17 g by mouth daily as needed for moderate constipation.    Yes Historical Provider, MD  polyvinyl alcohol (LIQUIFILM TEARS) 1.4 % ophthalmic solution Place 2 drops into both eyes 3 (three) times daily.   Yes Historical Provider, MD  sennosides-docusate sodium (SENOKOT-S) 8.6-50 MG tablet Take 2 tablets by mouth 2 (two) times daily.   Yes Historical Provider, MD  tamsulosin (FLOMAX) 0.4 MG CAPS capsule Take 0.4 mg by mouth daily after supper.    Yes Historical Provider, MD  traMADol (ULTRAM) 50 MG tablet Take 50 mg by mouth every 8 (eight) hours as needed.    Yes Historical Provider, MD  vitamin B-12 (CYANOCOBALAMIN) 1000 MCG tablet Take 1,000 mcg by mouth daily.   Yes Historical Provider, MD    Physical Exam: Filed Vitals:   05/05/13 2030 05/05/13 2100 05/05/13 2130 05/05/13 2145  BP: 134/50 114/42 128/55 133/106  Pulse: 72 67 65 77  Temp:      TempSrc:      Resp: 11 20 17 18   Weight:      SpO2: 94% 93% 95% 96%     General:   well-developed and nourished.   Eyes: Anicteric no pallor.   ENT: No discharge from ears eyes nose mouth.   Neck: No mass felt.   Cardiovascular:  S1-S2 heard.   Respiratory:  no rhonchi or crepitations.   Abdomen:  soft nontender bowel sounds present.   Skin: Skin rash on the right  anterior shin which patient's son states is chronic.   Musculoskeletal:  no edema.   Psychiatric:  patient follows commands.   Neurologic:  patient follows commands and moves all extremities.   Labs on Admission:  Basic Metabolic Panel:  Recent Labs Lab 05/05/13 1823  NA 150*  K 3.1*  CL 109  CO2 24  GLUCOSE 132*  BUN 36*  CREATININE 1.28  CALCIUM 7.9*   Liver Function Tests:  Recent Labs Lab 05/05/13 1823  AST 11  ALT 14  ALKPHOS 34*  BILITOT 0.8  PROT 6.1  ALBUMIN 2.3*   No results found for this basename: LIPASE, AMYLASE,  in the last 168 hours No results found for this basename: AMMONIA,  in the last 168 hours CBC:  Recent Labs Lab 05/05/13 1823  WBC 8.7  NEUTROABS 7.3  HGB 8.1*  HCT 25.7*  MCV 67.3*  PLT 252   Cardiac Enzymes: No results found for this basename: CKTOTAL, CKMB, CKMBINDEX, TROPONINI,  in the last 168 hours  BNP (last 3 results) No results found for this basename: PROBNP,  in the last 8760 hours CBG: No results found for this basename: GLUCAP,  in the last 168 hours  Radiological Exams on Admission: Ct Head Wo Contrast  05/05/2013   CLINICAL DATA:  Altered mental status.  EXAM: CT HEAD WITHOUT CONTRAST  TECHNIQUE: Contiguous axial images were obtained from the base of the skull through the vertex without intravenous contrast.  COMPARISON:  None.  FINDINGS: There is atrophy and chronic small vessel disease changes. Small old lacunar infarct in the right basal ganglia. No acute intracranial abnormality. Specifically, no hemorrhage, hydrocephalus, mass lesion, acute infarction, or significant intracranial injury. No acute calvarial abnormality.  Mucosal thickening within the ethmoid air cells and nasal cavity. Mastoid air cells are clear. Orbital soft tissues are unremarkable.  IMPRESSION: No acute intracranial abnormality.  Atrophy, chronic microvascular disease.   Electronically Signed   By: Charlett Nose M.D.   On: 05/05/2013 20:27   Dg  Chest Portable 1 View  05/05/2013   CLINICAL DATA:  Altered mental status since 2 o'clock today. Fever. Sepsis last week. CHF. Hypertension. Diabetes.  EXAM: PORTABLE CHEST - 1 VIEW  COMPARISON:  None.  FINDINGS: Degraded exam secondary to AP portable technique and patient chin overlying the right apex. A right-sided PICC line terminates at cavoatrial junction versus high right atrium.  Advanced glenohumeral joint osteoarthritis, primarily on the right.  Cardiomegaly accentuated by AP portable technique. No pleural fluid. No gross pneumothorax. Low lung volumes with resultant pulmonary interstitial prominence. Right greater the left bibasilar airspace disease.  IMPRESSION: Low lung volumes with right greater the left bibasilar airspace disease. Likely atelectasis. Early infection is difficult to exclude on the right.  Decreased sensitivity and specificity exam due to technique related factors, as described above.   Electronically Signed   By: Jeronimo Greaves M.D.   On: 05/05/2013 18:43    Assessment/Plan Principal Problem:   Acute encephalopathy Active Problems:   Diabetes mellitus without complication   Enterococcus UTI   Hypernatremia   Fever   Anemia   1. Acute encephalopathy - possibly secondary to infective and metabolic reasons. At this time blood cultures and urine cultures are been ordered. Patient has been empirically placed on vancomycin and Zosyn. As per patient's son patient is to receive Unasyn till February 11 for recent Enterobacter bacteremia from UTI. Suspect patient could have aspiration pneumonitis. I have ordered swallow evaluation. 2. Hypernatremia and hypokalemia - probably from dehydration and poor intake and patient being on Lasix. As per patient's son patient has been not eating well over the last few days. At this time we will gently hydrate with half normal saline and closely follow metabolic panel. Since patient has history of CHF closely watch out respiratory  status. 3. History of CHF with last EF measured 30-35% per patient's son - this patient was initially hypotensive and presently hypernatremic - patient has been placed on gentle hydration. Closely follow intake output and metabolic panel. Hold Lasix and antihypertensives for now. 4. Anemia - baseline hemoglobin not known. Patients son states that patient has thalassemia. There was no mention of any GI bleed or black stools. Closely follow CBC. I have requested patient's charts from [facility]. 5. History of diabetes mellitus type 2 on diet and sliding-scale  coverage - closely follow CBGs with sliding-scale coverage. 6. Recent admission at [facility] for enterococcus UTI sepsis and bacteremia - patient was on Unasyn and was to be continued until February 11. Patient has a PICC line. 7. Has chronic right lower extremity erythema.    Code Status: DO NOT RESUSCITATE.  Family Communication: Patient's son.  Disposition Plan: Admit to inpatient.    Bellamy Judson N. [facility] Pager [phone].  If 7PM-7AM, please contact night-coverage [email or web address] Password [facility] 05/05/2013, 10:18 PM

## 2013-05-05 NOTE — ED Notes (Signed)
Admitting MD at bedside.

## 2013-05-05 NOTE — ED Notes (Signed)
[facility] Was recently treated at [facility] two weeks ago for sepsis, moved to [facility] for iv antibiotics. Normally awake, a&ox4 and communicates with family. lsn today around 1330, pt is now altered. Was only responsive to pain pta and spo2 85 on 2L [place]. Placed on nrb pta and pt more awake and talking on arrival to ED. cbg 129.

## 2013-05-05 NOTE — Progress Notes (Signed)
ANTIBIOTIC CONSULT NOTE - INITIAL  Pharmacy Consult for Vancomycin/Zosyn  Indication: rule out sepsis  Allergies  Allergen Reactions  . Ativan [Lorazepam] Other (See Comments)    unknown    Patient Measurements: Weight: 171 lb 1.2 oz (77.6 kg)  Vital Signs: Temp: 97.5 F (36.4 C) (02/08 2226) Temp src: Oral (02/08 2226) BP: 157/64 mmHg (02/08 2226) Pulse Rate: 65 (02/08 2226)  Labs:  Recent Labs  05/05/13 1823  WBC 8.7  HGB 8.1*  PLT 252  CREATININE 1.28   Medical History: Past Medical History  Diagnosis Date  . GERD (gastroesophageal reflux disease)   . Hypertension   . BPH (benign prostatic hyperplasia)   . Diabetes mellitus without complication   . Constipation    Assessment: 80 y/o M here from [facility] with AMS, recent hospitalization at other facility, to get broad spectrum coverage for r/o sepsis. WBC 8.7, other labs as above.   Goal of Therapy:  Vancomycin trough level 15-20 mcg/ml  Plan:  -Vancomycin 750 mg IV q24h -Zosyn 3.375G IV q8h to be infused over 4 hours -Trend WBC, temp, renal function  -Drug levels at steady state  Riley Martinez, Riley Martinez 05/05/2013,10:40 PM

## 2013-05-05 NOTE — ED Notes (Signed)
Having blood drawn, chest xray and will then start iv antibtiotics, unable to use picc line till after chest xray verifies its position due to it being placed at [facility] over 2 weeks ago.

## 2013-05-06 DIAGNOSIS — B952 Enterococcus as the cause of diseases classified elsewhere: Secondary | ICD-10-CM

## 2013-05-06 DIAGNOSIS — N390 Urinary tract infection, site not specified: Secondary | ICD-10-CM

## 2013-05-06 LAB — TSH: TSH: 2.037 u[IU]/mL (ref 0.350–4.500)

## 2013-05-06 LAB — URINE CULTURE
CULTURE: NO GROWTH
Colony Count: NO GROWTH

## 2013-05-06 LAB — GLUCOSE, CAPILLARY
GLUCOSE-CAPILLARY: 126 mg/dL — AB (ref 70–99)
GLUCOSE-CAPILLARY: 128 mg/dL — AB (ref 70–99)
GLUCOSE-CAPILLARY: 142 mg/dL — AB (ref 70–99)
GLUCOSE-CAPILLARY: 149 mg/dL — AB (ref 70–99)

## 2013-05-06 LAB — CBC WITH DIFFERENTIAL/PLATELET
BASOS PCT: 0 % (ref 0–1)
Basophils Absolute: 0 10*3/uL (ref 0.0–0.1)
EOS PCT: 4 % (ref 0–5)
Eosinophils Absolute: 0.3 10*3/uL (ref 0.0–0.7)
HCT: 27.5 % — ABNORMAL LOW (ref 39.0–52.0)
Hemoglobin: 8.5 g/dL — ABNORMAL LOW (ref 13.0–17.0)
LYMPHS PCT: 12 % (ref 12–46)
Lymphs Abs: 1 10*3/uL (ref 0.7–4.0)
MCH: 20.8 pg — ABNORMAL LOW (ref 26.0–34.0)
MCHC: 30.9 g/dL (ref 30.0–36.0)
MCV: 67.4 fL — ABNORMAL LOW (ref 78.0–100.0)
Monocytes Absolute: 0.7 10*3/uL (ref 0.1–1.0)
Monocytes Relative: 8 % (ref 3–12)
NEUTROS PCT: 76 % (ref 43–77)
Neutro Abs: 6.7 10*3/uL (ref 1.7–7.7)
Platelets: 244 10*3/uL (ref 150–400)
RBC: 4.08 MIL/uL — AB (ref 4.22–5.81)
RDW: 14.9 % (ref 11.5–15.5)
WBC: 8.7 10*3/uL (ref 4.0–10.5)

## 2013-05-06 LAB — COMPREHENSIVE METABOLIC PANEL
ALT: 14 U/L (ref 0–53)
AST: 11 U/L (ref 0–37)
Albumin: 2.3 g/dL — ABNORMAL LOW (ref 3.5–5.2)
Alkaline Phosphatase: 32 U/L — ABNORMAL LOW (ref 39–117)
BUN: 33 mg/dL — ABNORMAL HIGH (ref 6–23)
CALCIUM: 7.9 mg/dL — AB (ref 8.4–10.5)
CO2: 25 mEq/L (ref 19–32)
CREATININE: 1.05 mg/dL (ref 0.50–1.35)
Chloride: 108 mEq/L (ref 96–112)
GFR, EST AFRICAN AMERICAN: 67 mL/min — AB (ref >90)
GFR, EST NON AFRICAN AMERICAN: 58 mL/min — AB (ref >90)
GLUCOSE: 114 mg/dL — AB (ref 70–99)
Potassium: 3.3 mEq/L — ABNORMAL LOW (ref 3.7–5.3)
Sodium: 149 mEq/L — ABNORMAL HIGH (ref 137–147)
Total Bilirubin: 0.8 mg/dL (ref 0.3–1.2)
Total Protein: 6.2 g/dL (ref 6.0–8.3)

## 2013-05-06 LAB — MRSA PCR SCREENING: MRSA BY PCR: NEGATIVE

## 2013-05-06 LAB — INFLUENZA PANEL BY PCR (TYPE A & B)
H1N1 flu by pcr: NOT DETECTED
Influenza A By PCR: NEGATIVE
Influenza B By PCR: NEGATIVE

## 2013-05-06 MED ORDER — KETOROLAC TROMETHAMINE 30 MG/ML IJ SOLN
10.0000 mg | Freq: Three times a day (TID) | INTRAMUSCULAR | Status: DC | PRN
  Administered 2013-05-06 – 2013-05-09 (×8): 9.9 mg via INTRAVENOUS
  Filled 2013-05-06 (×9): qty 1

## 2013-05-06 MED ORDER — BIOTENE DRY MOUTH MT LIQD
15.0000 mL | Freq: Two times a day (BID) | OROMUCOSAL | Status: DC
  Administered 2013-05-06 – 2013-05-15 (×16): 15 mL via OROMUCOSAL

## 2013-05-06 MED ORDER — DEXTROSE-NACL 5-0.9 % IV SOLN
INTRAVENOUS | Status: DC
  Administered 2013-05-06 – 2013-05-07 (×2): via INTRAVENOUS

## 2013-05-06 MED ORDER — ENSURE COMPLETE PO LIQD
237.0000 mL | Freq: Two times a day (BID) | ORAL | Status: DC
  Administered 2013-05-06 – 2013-05-09 (×3): 237 mL via ORAL

## 2013-05-06 MED ORDER — RESOURCE THICKENUP CLEAR PO POWD
ORAL | Status: DC | PRN
  Filled 2013-05-06: qty 125

## 2013-05-06 MED ORDER — POTASSIUM CHLORIDE 20 MEQ/15ML (10%) PO LIQD
40.0000 meq | Freq: Two times a day (BID) | ORAL | Status: DC
  Filled 2013-05-06 (×3): qty 30

## 2013-05-06 MED ORDER — POTASSIUM CHLORIDE CRYS ER 20 MEQ PO TBCR
40.0000 meq | EXTENDED_RELEASE_TABLET | Freq: Two times a day (BID) | ORAL | Status: DC
  Administered 2013-05-06 – 2013-05-07 (×3): 40 meq via ORAL
  Filled 2013-05-06 (×5): qty 2

## 2013-05-06 NOTE — Progress Notes (Signed)
[facility] PROGRESS NOTE  Riley Martinez XLK:[id number] DOB: 1914-12-15 DOA: 05/05/2013 PCP: No primary provider on file.  Assessment/Plan: 1. Acute encephalopathy secondary to health care associated pneumonia.- possibly secondary to infective and metabolic reasons. At this time blood cultures and urine cultures are been ordered. Patient has been empirically placed on vancomycin and Zosyn. As per patient's son patient is to receive Unasyn till February 11 for recent Enterobacter bacteremia from UTI. Suspect patient could have aspiration pneumonitis. I have ordered swallow evaluation, reocmmended dysphagia 2 honey thick liquids.  2. Hypernatremia and hypokalemia - probably from dehydration and poor intake and patient being on Lasix. As per patient's son patient has been not eating well over the last few days. At this time we will gently hydrate with half normal saline and closely follow metabolic panel. Since patient has history of CHF closely watch out respiratory status. 3. History of CHF with last EF measured 30-35% per patient's son - this patient was initially hypotensive and presently hypernatremic - patient has been placed on gentle hydration. Closely follow intake output and metabolic panel. Hold Lasix and antihypertensives for now. 4. Anemia - baseline hemoglobin not known. Patients son states that patient has thalassemia. There was no mention of any GI bleed or black stools. Closely follow CBC. I have requested patient's charts from [facility]. 5. History of diabetes mellitus type 2 on diet and sliding-scale coverage - closely follow CBGs with sliding-scale coverage. 6. Recent admission at [facility] for enterococcus UTI sepsis and bacteremia - patient was on Unasyn and was to be continued until February 11. Patient has a PICC line. 7. Has chronic right lower extremity erythema.  Code Status: DO NOT RESUSCITATE.  Family Communication: Patient's son.   Disposition Plan: snf when stable.    Consultants:  none  Procedures:  none  Antibiotics:  Vancomycin   Zosyn 2/8  HPI/Subjective: No complaints  Objective: Filed Vitals:   05/06/13 1306  BP: 137/61  Pulse: 78  Temp: 97.7 F (36.5 C)  Resp: 18    Intake/Output Summary (Last 24 hours) at 05/06/13 1537 Last data filed at 05/06/13 0859  Gross per 24 hour  Intake  522.5 ml  Output    200 ml  Net  322.5 ml   Filed Weights   05/05/13 1945 05/05/13 2226  Weight: 78.019 kg (172 lb) 77.6 kg (171 lb 1.2 oz)    Exam: Alert and sitting on the bed, no distress.  Cardiovascular: S1-S2 heard.  Respiratory: no rhonchi or crepitations.  Abdomen: soft nontender bowel sounds present.  Skin: Skin rash on the right anterior shin which patient's son states is chronic.  Musculoskeletal: no edema   Data Reviewed: Basic Metabolic Panel:  Recent Labs Lab 05/05/13 1823 05/06/13 0430  NA 150* 149*  K 3.1* 3.3*  CL 109 108  CO2 24 25  GLUCOSE 132* 114*  BUN 36* 33*  CREATININE 1.28 1.05  CALCIUM 7.9* 7.9*   Liver Function Tests:  Recent Labs Lab 05/05/13 1823 05/06/13 0430  AST 11 11  ALT 14 14  ALKPHOS 34* 32*  BILITOT 0.8 0.8  PROT 6.1 6.2  ALBUMIN 2.3* 2.3*   No results found for this basename: LIPASE, AMYLASE,  in the last 168 hours No results found for this basename: AMMONIA,  in the last 168 hours CBC:  Recent Labs Lab 05/05/13 1823 05/06/13 0430  WBC 8.7 8.7  NEUTROABS 7.3 6.7  HGB 8.1* 8.5*  HCT 25.7* 27.5*  MCV 67.3* 67.4*  PLT 252 244   Cardiac Enzymes: No results found for this basename: CKTOTAL, CKMB, CKMBINDEX, TROPONINI,  in the last 168 hours BNP (last 3 results) No results found for this basename: PROBNP,  in the last 8760 hours CBG:  Recent Labs Lab 05/05/13 2231 05/06/13 0848 05/06/13 1208  GLUCAP 128* 126* 128*    Recent Results (from the past 240 hour(s))  CULTURE, BLOOD (ROUTINE X 2)     Status: None   Collection  Time    05/05/13  5:55 PM      Result Value Range Status   Specimen Description BLOOD RIGHT ARM   Final   Special Requests BOTTLES DRAWN AEROBIC ONLY [facility]   Final   Culture  Setup Time     Final   Value: 05/05/2013 23:31     Performed at [facility]   Culture     Final   Value:        BLOOD CULTURE RECEIVED NO GROWTH TO DATE CULTURE WILL BE HELD FOR 5 DAYS BEFORE ISSUING A FINAL NEGATIVE REPORT     Performed at [facility]   Report Status PENDING   Incomplete  CULTURE, BLOOD (ROUTINE X 2)     Status: None   Collection Time    05/05/13  6:00 PM      Result Value Range Status   Specimen Description BLOOD RIGHT HAND   Final   Special Requests BOTTLES DRAWN AEROBIC AND ANAEROBIC 5CC   Final   Culture  Setup Time     Final   Value: 05/05/2013 23:31     Performed at [facility]   Culture     Final   Value:        BLOOD CULTURE RECEIVED NO GROWTH TO DATE CULTURE WILL BE HELD FOR 5 DAYS BEFORE ISSUING A FINAL NEGATIVE REPORT     Performed at [facility]   Report Status PENDING   Incomplete  MRSA PCR SCREENING     Status: None   Collection Time    05/06/13  5:01 AM      Result Value Range Status   MRSA by PCR NEGATIVE  NEGATIVE Final   Comment:            The GeneXpert MRSA Assay (FDA     approved for NASAL specimens     only), is one component of a     comprehensive MRSA colonization     surveillance program. It is not     intended to diagnose MRSA     infection nor to guide or     monitor treatment for     MRSA infections.     Studies: Ct Head Wo Contrast  05/05/2013   CLINICAL DATA:  Altered mental status.  EXAM: CT HEAD WITHOUT CONTRAST  TECHNIQUE: Contiguous axial images were obtained from the base of the skull through the vertex without intravenous contrast.  COMPARISON:  None.  FINDINGS: There is atrophy and chronic small vessel disease changes. Small old lacunar infarct in the right basal ganglia. No acute intracranial abnormality.  Specifically, no hemorrhage, hydrocephalus, mass lesion, acute infarction, or significant intracranial injury. No acute calvarial abnormality.  Mucosal thickening within the ethmoid air cells and nasal cavity. Mastoid air cells are clear. Orbital soft tissues are unremarkable.  IMPRESSION: No acute intracranial abnormality.  Atrophy, chronic microvascular disease.   Electronically Signed   By: Charlett NoseKevin  Dover M.D.   On: 05/05/2013 20:27   Dg Chest Portable  1 View  05/05/2013   CLINICAL DATA:  Altered mental status since 2 o'clock today. Fever. Sepsis last week. CHF. Hypertension. Diabetes.  EXAM: PORTABLE CHEST - 1 VIEW  COMPARISON:  None.  FINDINGS: Degraded exam secondary to AP portable technique and patient chin overlying the right apex. A right-sided PICC line terminates at cavoatrial junction versus high right atrium.  Advanced glenohumeral joint osteoarthritis, primarily on the right.  Cardiomegaly accentuated by AP portable technique. No pleural fluid. No gross pneumothorax. Low lung volumes with resultant pulmonary interstitial prominence. Right greater the left bibasilar airspace disease.  IMPRESSION: Low lung volumes with right greater the left bibasilar airspace disease. Likely atelectasis. Early infection is difficult to exclude on the right.  Decreased sensitivity and specificity exam due to technique related factors, as described above.   Electronically Signed   By: Jeronimo Greaves M.D.   On: 05/05/2013 18:43    Scheduled Meds: . antiseptic oral rinse  15 mL Mouth Rinse BID  . aspirin EC  81 mg Oral Daily  . clotrimazole  1 application Topical BID  . enoxaparin (LOVENOX) injection  40 mg Subcutaneous Q24H  . feeding supplement (ENSURE COMPLETE)  237 mL Oral BID BM  . fluocinonide-emollient   Topical Custom  . fluticasone  1 spray Each Nare Daily  . insulin aspart  0-9 Units Subcutaneous TID WC  . lactobacillus acidophilus  1 tablet Oral TID  . pantoprazole  40 mg Oral Daily  .  piperacillin-tazobactam (ZOSYN)  IV  3.375 g Intravenous Q8H  . polyvinyl alcohol  2 drop Both Eyes TID  . potassium chloride  40 mEq Oral BID  . sennosides-docusate sodium  2 tablet Oral BID  . sodium chloride  3 mL Intravenous Q12H  . tamsulosin  0.4 mg Oral QPC supper  . vancomycin  750 mg Intravenous Q24H  . vitamin B-12  1,000 mcg Oral Daily   Continuous Infusions: . dextrose 5 % and 0.9% NaCl 50 mL/hr at 05/06/13 1132    Principal Problem:   Acute encephalopathy Active Problems:   Diabetes mellitus without complication   Enterococcus UTI   Hypernatremia   Fever   Anemia    Time spent: 35 min    Riley Martinez  [facility] Pager [phone] If 7PM-7AM, please contact night-coverage at [email or web address], password [facility] 05/06/2013, 3:37 PM  LOS: 1 day

## 2013-05-06 NOTE — Progress Notes (Signed)
INITIAL NUTRITION ASSESSMENT  DOCUMENTATION CODES Per approved criteria  -Severe malnutrition in the context of acute illness or injury   INTERVENTION: Add Ensure Complete po BID, each supplement provides 350 kcal and 13 grams of protein - please thicken to appropriate consistency. Prefers chocolate. Add Magic cup TID with meals, each supplement provides 290 kcal and 9 grams of protein. RD to continue to follow nutrition care plan.  NUTRITION DIAGNOSIS: Inadequate oral intake related to poor appetite as evidenced by son's report.   Goal: Intake to meet >90% of estimated nutrition needs.  Monitor:  weight trends, lab trends, I/O's, PO intake, supplement tolerance  Reason for Assessment: Malnutrition Screening Tool  80 y.o. male  Admitting Dx: Acute encephalopathy  ASSESSMENT: PMHx significant for GERD, DM, constipation. Recently admitted to OSH with sepsis 2/2 UTI and decompensated CHF, discharged 4 days ago to SNF. Admitted after being found unresponsive, labs revealed hypernatremia, hypokalemia, leukocytosis, and anemia. Work-up ongoing.  Son reports that pt was not eating well for the past few days.  BSE completed by SLP - recommendation includes Dysphagia 2 with Honey Thickened Liquids. Per SLP note, pt had a FEES recommending thickened liquids while at OSH, however pt consumed very little, and son gave pt water for comfort.  Discussed intake with patient at bedside. He confirms that he hasn't been eating well. Son states that patient's usual weight is around 171 lb, which is consistent with current weight.   Nutrition Focused Physical Exam:  Subcutaneous Fat:  Orbital Region: WNL Upper Arm Region: moderate depletion Thoracic and Lumbar Region: n/a  Muscle:  Temple Region: WNL Clavicle Bone Region: moderate depletion Clavicle and Acromion Bone Region: n/a Scapular Bone Region: n/a Dorsal Hand: n/a Patellar Region: n/a Anterior Thigh Region: n/a Posterior Calf  Region:  n/a  Edema: none  Pt meets criteria for severe MALNUTRITION in the context of acute illness as evidenced by intake of <50% x at least 5 days and moderate fat and muscle mass loss.  Potassium is low at 3.3  Height: Ht Readings from Last 1 Encounters:  05/06/13 5\' 4"  (1.626 m)    Weight: Wt Readings from Last 1 Encounters:  05/05/13 171 lb 1.2 oz (77.6 kg)    Ideal Body Weight: 130 lb  % Ideal Body Weight: 132%  Wt Readings from Last 10 Encounters:  05/05/13 171 lb 1.2 oz (77.6 kg)    Usual Body Weight: n/a  % Usual Body Weight: n/a  BMI:  Body mass index is 29.35 kg/(m^2). Overweight  Estimated Nutritional Needs: Kcal: 1500 - 1700 Protein: 60 - 75 g Fluid: approx 1.8 - 2 liters daily  Skin: abrasion to R leg  Diet Order: Dysphagia 2; Honey Thick  EDUCATION NEEDS: -No education needs identified at this time   Intake/Output Summary (Last 24 hours) at 05/06/13 1213 Last data filed at 05/06/13 0859  Gross per 24 hour  Intake  522.5 ml  Output    200 ml  Net  322.5 ml    Last BM: PTA  Labs:   Recent Labs Lab 05/05/13 1823 05/06/13 0430  NA 150* 149*  K 3.1* 3.3*  CL 109 108  CO2 24 25  BUN 36* 33*  CREATININE 1.28 1.05  CALCIUM 7.9* 7.9*  GLUCOSE 132* 114*    CBG (last 3)   Recent Labs  05/05/13 2231 05/06/13 0848  GLUCAP 128* 126*    Scheduled Meds: . antiseptic oral rinse  15 mL Mouth Rinse BID  . aspirin EC  81 mg Oral Daily  . clotrimazole  1 application Topical BID  . enoxaparin (LOVENOX) injection  40 mg Subcutaneous Q24H  . fluocinonide-emollient   Topical Custom  . fluticasone  1 spray Each Nare Daily  . insulin aspart  0-9 Units Subcutaneous TID WC  . lactobacillus acidophilus  1 tablet Oral TID  . pantoprazole  40 mg Oral Daily  . piperacillin-tazobactam (ZOSYN)  IV  3.375 g Intravenous Q8H  . polyvinyl alcohol  2 drop Both Eyes TID  . potassium chloride  40 mEq Oral BID  . sennosides-docusate sodium  2 tablet  Oral BID  . sodium chloride  3 mL Intravenous Q12H  . tamsulosin  0.4 mg Oral QPC supper  . vancomycin  750 mg Intravenous Q24H  . vitamin B-12  1,000 mcg Oral Daily    Continuous Infusions: . dextrose 5 % and 0.9% NaCl 50 mL/hr at 05/06/13 1132    Past Medical History  Diagnosis Date  . GERD (gastroesophageal reflux disease)   . Hypertension   . BPH (benign prostatic hyperplasia)   . Diabetes mellitus without complication   . Constipation     Past Surgical History  Procedure Laterality Date  . Hip surgery    . Hernia repair      Jarold Motto MS, RD, LDN Pager: [phone] After-hours pager: [phone]

## 2013-05-06 NOTE — Evaluation (Signed)
Clinical/Bedside Swallow Evaluation Patient Details  Name: Riley Martinez MRN: [id number] Date of Birth: December 02, 1914  Today's Date: 05/06/2013 Time: 4034-7425 SLP Time Calculation (min): 60 min  Past Medical History:  Past Medical History  Diagnosis Date  . GERD (gastroesophageal reflux disease)   . Hypertension   . BPH (benign prostatic hyperplasia)   . Diabetes mellitus without complication   . Constipation    Past Surgical History:  Past Surgical History  Procedure Laterality Date  . Hip surgery    . Hernia repair     HPI:  Riley Martinez is a 80 y.o. male who was recently admitted at [facility] with sepsis secondary to enterococcus UTI, at that time patient also was found to have decompensated CHF and was eventually discharged to rehabilitation. Patient was discharged on Thursday 4 days ago. Today patient was found to be unresponsive and was brought to the ER. Patient in the ER and CT head which was unremarkable. Labs revealed hypernatremia and hypokalemia with leukocytosis and anemia. Chest x-ray shows possible infiltrates and patient was found to be febrile. Initially on arrival patient was found to be hypotensive as per the ER physician and was given 500 cc normal saline bolus. Patient's son states that after patient was brought to the ER had received some fluid patient has become more alert. On my exam patient is responding to his name and following commands. Per patient's son patient prior to recent admission at [facility] used to be ambulatory. Patient did not have any nausea vomiting abdominal pain shortness of breath. Has been having off and on cough which was nonproductive. Did not have any chest pain. Patient's son states that during the stay at [facility] patient also was treated for influenza pneumonia with Tamiflu. Patient has not been eating well for last few days. Note from nursing home state pt had a FEES and that Honey thick liquids were  recommended.    Assessment / Plan / Recommendation Clinical Impression  Pt demonstrates overt evidence of dysphagia with thin liquids including delayed swallow response, wet vocal quality and cough. Pt has a complex situation. He is suspected to have a baseline structural dysphagia, obvious due to curvature of spine and forward head position. Likely he has become decompensated by UTI, poor PO intake, bedbound status and resulting pain and medication. During stay at [facility] he is documented to have ha da FEES recommending thickened liquids which he has consumed very little of. His son has continued giving him water for comfort. Now he has suspected aspiration pna. Offered option of liberliazing diet with risk for pts comfort. Son would rather continue to treat infection with hope that pt will return to baseline and be able to tolerate thin liquids a little better. He agreed to Dys 2/honey thick diet for now with reassessment near d/c. SLP requested RN send for FEES report from [place].     Aspiration Risk  Severe    Diet Recommendation Dysphagia 2 (Fine chop);Honey-thick liquid   Liquid Administration via: Cup Medication Administration: Crushed with puree Supervision: Staff to assist with self feeding Compensations: Slow rate;Small sips/bites Postural Changes and/or Swallow Maneuvers: Seated upright 90 degrees;Upright 30-60 min after meal    Other  Recommendations Recommended Consults: MBS (before d/c) Oral Care Recommendations: Oral care BID Other Recommendations: Order thickener from pharmacy   Follow Up Recommendations  Skilled Nursing facility    Frequency and Duration min 2x/week  2 weeks   Pertinent Vitals/Pain NA    SLP Swallow  Goals     Swallow Study Prior Functional Status       General HPI: Riley Martinez is a 80 y.o. male who was recently admitted at [facility] with sepsis secondary to enterococcus UTI, at that time patient also was found to have  decompensated CHF and was eventually discharged to rehabilitation. Patient was discharged on Thursday 4 days ago. Today patient was found to be unresponsive and was brought to the ER. Patient in the ER and CT head which was unremarkable. Labs revealed hypernatremia and hypokalemia with leukocytosis and anemia. Chest x-ray shows possible infiltrates and patient was found to be febrile. Initially on arrival patient was found to be hypotensive as per the ER physician and was given 500 cc normal saline bolus. Patient's son states that after patient was brought to the ER had received some fluid patient has become more alert. On my exam patient is responding to his name and following commands. Per patient's son patient prior to recent admission at [facility] used to be ambulatory. Patient did not have any nausea vomiting abdominal pain shortness of breath. Has been having off and on cough which was nonproductive. Did not have any chest pain. Patient's son states that during the stay at [facility] patient also was treated for influenza pneumonia with Tamiflu. Patient has not been eating well for last few days. Note from nursing home state pt had a FEES and that Honey thick liquids were recommended.  Type of Study: Bedside swallow evaluation Previous Swallow Assessment: FE[facility] at [facility], record requested. Per SNF report pt recommended to consume honey thick liquids.  Diet Prior to this Study: NPO Temperature Spikes Noted: No Respiratory Status: Nasal cannula History of Recent Intubation: No Behavior/Cognition: Alert;Cooperative;Pleasant mood Oral Cavity - Dentition: Missing dentition;Poor condition Self-Feeding Abilities: Needs assist Patient Positioning: Postural control interferes with function Baseline Vocal Quality: Clear Volitional Cough: Strong Volitional Swallow: Able to elicit    Oral/Motor/Sensory Function Overall Oral Motor/Sensory Function: Appears within functional limits for  tasks assessed   Ice Chips Ice chips: Impaired Presentation: Spoon Oral Phase Impairments: Impaired anterior to posterior transit Oral Phase Functional Implications: Prolonged oral transit;Oral holding Pharyngeal Phase Impairments: Suspected delayed Swallow;Decreased hyoid-laryngeal movement;Wet Vocal Quality;Throat Clearing - Immediate;Cough - Immediate   Thin Liquid Thin Liquid: Impaired Presentation: Cup;Straw Pharyngeal  Phase Impairments: Suspected delayed Swallow;Decreased hyoid-laryngeal movement;Throat Clearing - Immediate;Cough - Immediate;Wet Vocal Quality (decreased cough with large straw sips)    Nectar Thick Nectar Thick Liquid: Not tested   Honey Thick Honey Thick Liquid: Not tested   Puree Puree: Impaired Presentation: Spoon Pharyngeal Phase Impairments: Decreased hyoid-laryngeal movement;Suspected delayed Swallow   Solid   GO    Solid: Not tested      Harlon DittyBonnie Ociel Retherford, MA CCC-SLP [phone]  Riley Martinez, Riley NearingBonnie Caroline 05/06/2013,9:59 AM

## 2013-05-06 NOTE — Progress Notes (Signed)
PT Cancellation Note  Patient Details Name: Riley Martinez MRN: [id number] DOB: Jan 26, 1915   Cancelled Treatment:    Reason Eval/Treat Not Completed: Fatigue/lethargy limiting ability to participate; patient just now resting after up since 3 AM per son.  Requests PT try again later in PM.   Alaina Donati,CYNDI 05/06/2013, 9:58 AM

## 2013-05-06 NOTE — Evaluation (Signed)
Physical Therapy Evaluation Patient Details Name: Riley Martinez MRN: [id number] DOB: Mar 15, 1915 Today's Date: 05/06/2013 Time: 1450-1530 (and 1552-1600) PT Time Calculation (min): 40 min  PT Assessment / Plan / Recommendation History of Present Illness  Riley Martinez is a 80 y.o. male who was recently admitted at [facility] with sepsis secondary to enterococcus UTI, at that time patient also was found to have decompensated CHF and was eventually discharged to rehabilitation.  Today patient was found to be unresponsive.  Chest x-ray shows possible infiltrates and patient was found to be febrile.   Clinical Impression  Patient presents with decreased independence with mobility due to deficits listed below.  He will benefit from skilled PT in the acute setting to allow decreased burden of care at next venue of care.    PT Assessment  Patient needs continued PT services    Follow Up Recommendations  SNF    Does the patient have the potential to tolerate intense rehabilitation    No  Barriers to Discharge  None      Equipment Recommendations  None recommended by PT    Recommendations for Other Services   None  Frequency Min 2X/week    Precautions / Restrictions Precautions Precautions: Fall   Pertinent Vitals/Pain No pain complaints      Mobility  Bed Mobility Overal bed mobility: Needs Assistance Bed Mobility: Rolling;Sidelying to Sit Rolling: Max assist Sidelying to sit: Max assist;HOB elevated General bed mobility comments: cues to use rail and for technique Transfers Overall transfer level: Needs assistance Equipment used: Rolling walker (2 wheeled) Transfers: Sit to/from [employer] Sit to Stand: Mod assist;Max assist;+2 physical assistance Stand pivot transfers: +2 physical assistance;Mod assist General transfer comment: pt able to initiate rising, but with posterior bias and fearful of falling, able to take small shuffling steps  with walker (assist to move walker) to step to [facility] then to recliner (+2 assist for safety to recliner)    Exercises     PT Diagnosis: Generalized weakness;Difficulty walking  PT Problem List: Decreased strength;Decreased mobility;Decreased activity tolerance;Decreased balance;Decreased safety awareness PT Treatment Interventions: DME instruction;Therapeutic exercise;Gait training;Balance training;Functional mobility training;Therapeutic activities;Patient/family education     PT Goals(Current goals can be found in the care plan section) Acute Rehab PT Goals Patient Stated Goal: To go to rehab PT Goal Formulation: With patient/family Time For Goal Achievement: 05/20/13 Potential to Achieve Goals: Fair  Visit Information  Last PT Received On: 05/06/13 Assistance Needed: +2 History of Present Illness: Riley Martinez is a 80 y.o. male who was recently admitted at [facility] with sepsis secondary to enterococcus UTI, at that time patient also was found to have decompensated CHF and was eventually discharged to rehabilitation.  Today patient was found to be unresponsive.  Chest x-ray shows possible infiltrates and patient was found to be febrile.        Prior Functioning  Home Living Family/patient expects to be discharged to:: Skilled nursing facility Prior Function Level of Independence: Needs assistance Gait / Transfers Assistance Needed: son states was able to stand with assist and move feet with walker at [facility] Communication Communication: [facility]    Cognition  Cognition Arousal/Alertness: Lethargic Behavior During Therapy: WFL for tasks assessed/performed Overall Cognitive Status: History of cognitive impairments - at baseline    Extremity/Trunk Assessment Lower Extremity Assessment Lower Extremity Assessment: Generalized weakness Cervical / Trunk Assessment Cervical / Trunk Assessment: Kyphotic;Other exceptions Cervical / Trunk Exceptions: cervical spine fused  in flexion   Balance Balance Overall  balance assessment: Needs assistance Sitting balance-Leahy Scale: Poor Sitting balance - Comments: leaning right initially sitting on hump in bed, assist for moving feet and getting balanced. Postural control: Right lateral lean;Posterior lean Standing balance-Leahy Scale: Poor Standing balance comment: needs UE assist and mod support for posterior lean  End of Session PT - End of Session Equipment Utilized During Treatment: Gait belt Activity Tolerance: Patient limited by fatigue Patient left: in chair;with family/visitor present;with call bell/phone within reach  GP     [facility]NN,Riley 05/06/2013, 5:14 PM Glendorayndi Wilferd Martinez, [place]PT [phone] 05/06/2013

## 2013-05-06 NOTE — Progress Notes (Signed)
New Admission Note:  Arrival Method:  Bed from the ED Mental Orientation: A&O to person Telemetry: [id number] Assessment:  Completed Skin: Dry & Intact IV:  Double lumen PICC Right upper arm, NSL Left hand dated 05/05/13 Pain: denies Tubes: None Safety Measures:  Safety Fall Prevention Plan has been given, discussed and signed with son Jonny Ruiz([name]). Admission:  Unable to complete psychosocial assessment at this time due to patients AMS 6 [place] Orientation:  Patient/son has been orientated to the room, unit and staff.  Son at bedside

## 2013-05-07 ENCOUNTER — Inpatient Hospital Stay (HOSPITAL_COMMUNITY): Payer: Medicare Other

## 2013-05-07 DIAGNOSIS — R1310 Dysphagia, unspecified: Secondary | ICD-10-CM

## 2013-05-07 LAB — BASIC METABOLIC PANEL
BUN: 26 mg/dL — AB (ref 6–23)
CALCIUM: 8.1 mg/dL — AB (ref 8.4–10.5)
CO2: 27 mEq/L (ref 19–32)
Chloride: 112 mEq/L (ref 96–112)
Creatinine, Ser: 1.15 mg/dL (ref 0.50–1.35)
GFR calc non Af Amer: 52 mL/min — ABNORMAL LOW (ref >90)
GFR, EST AFRICAN AMERICAN: 60 mL/min — AB (ref >90)
GLUCOSE: 153 mg/dL — AB (ref 70–99)
POTASSIUM: 4 meq/L (ref 3.7–5.3)
Sodium: 150 mEq/L — ABNORMAL HIGH (ref 137–147)

## 2013-05-07 LAB — GLUCOSE, CAPILLARY
GLUCOSE-CAPILLARY: 166 mg/dL — AB (ref 70–99)
Glucose-Capillary: 123 mg/dL — ABNORMAL HIGH (ref 70–99)
Glucose-Capillary: 166 mg/dL — ABNORMAL HIGH (ref 70–99)
Glucose-Capillary: 128 mg/dL — ABNORMAL HIGH (ref 70–99)

## 2013-05-07 MED ORDER — SODIUM CHLORIDE 0.9 % IV SOLN
3.0000 g | Freq: Four times a day (QID) | INTRAVENOUS | Status: AC
  Administered 2013-05-07 – 2013-05-08 (×6): 3 g via INTRAVENOUS
  Filled 2013-05-07 (×7): qty 3

## 2013-05-07 MED ORDER — DEXTROSE 5 % IV SOLN
INTRAVENOUS | Status: DC
  Administered 2013-05-07: 1000 mL via INTRAVENOUS
  Administered 2013-05-08 – 2013-05-14 (×4): via INTRAVENOUS

## 2013-05-07 NOTE — Progress Notes (Signed)
ANTIBIOTIC CONSULT NOTE - INITIAL  Pharmacy Consult for unasyn Indication: enterococcus UTI  Allergies  Allergen Reactions  . Ativan [Lorazepam] Other (See Comments)    unknown    Patient Measurements: Height: 5\' 4"  (162.6 cm) (per son) Weight: 179 lb 7.3 oz (81.4 kg) IBW/kg (Calculated) : 59.2 Adjusted Body Weight:   Vital Signs: Temp: 98.7 F (37.1 C) (02/10 1206) Temp src: Oral (02/10 1206) BP: 136/48 mmHg (02/10 1206) Pulse Rate: 78 (02/10 1206) Intake/Output from previous day: 02/09 0701 - 02/10 0700 In: 1216.7 [P.O.:50; I.V.:966.7; IV Piggyback:200] Out: 200 [Urine:200] Intake/Output from this shift: Total I/O In: 120 [P.O.:120] Out: -   Labs:  Recent Labs  05/05/13 1823 05/06/13 0430  WBC 8.7 8.7  HGB 8.1* 8.5*  PLT 252 244  CREATININE 1.28 1.05   Estimated Creatinine Clearance: 39.6 ml/min (by C-G formula based on Cr of 1.05). No results found for this basename: VANCOTROUGH, VANCOPEAK, VANCORANDOM, GENTTROUGH, GENTPEAK, GENTRANDOM, TOBRATROUGH, TOBRAPEAK, TOBRARND, AMIKACINPEAK, AMIKACINTROU, AMIKACIN,  in the last 72 hours   Microbiology: Recent Results (from the past 720 hour(s))  CULTURE, BLOOD (ROUTINE X 2)     Status: None   Collection Time    05/05/13  5:55 PM      Result Value Range Status   Specimen Description BLOOD RIGHT ARM   Final   Special Requests BOTTLES DRAWN AEROBIC ONLY [facility]   Final   Culture  Setup Time     Final   Value: 05/05/2013 23:31     Performed at [facility]   Culture     Final   Value:        BLOOD CULTURE RECEIVED NO GROWTH TO DATE CULTURE WILL BE HELD FOR 5 DAYS BEFORE ISSUING A FINAL NEGATIVE REPORT     Performed at [facility]   Report Status PENDING   Incomplete  CULTURE, BLOOD (ROUTINE X 2)     Status: None   Collection Time    05/05/13  6:00 PM      Result Value Range Status   Specimen Description BLOOD RIGHT HAND   Final   Special Requests BOTTLES DRAWN AEROBIC AND ANAEROBIC 5CC   Final   Culture  Setup Time     Final   Value: 05/05/2013 23:31     Performed at [facility]   Culture     Final   Value:        BLOOD CULTURE RECEIVED NO GROWTH TO DATE CULTURE WILL BE HELD FOR 5 DAYS BEFORE ISSUING A FINAL NEGATIVE REPORT     Performed at [facility]   Report Status PENDING   Incomplete  URINE CULTURE     Status: None   Collection Time    05/05/13  7:00 PM      Result Value Range Status   Specimen Description URINE, CATHETERIZED   Final   Special Requests NONE   Final   Culture  Setup Time     Final   Value: 05/05/2013 23:32     Performed at [facility]   Colony Count     Final   Value: NO GROWTH     Performed at [facility]   Culture     Final   Value: NO GROWTH     Performed at [facility]   Report Status 05/06/2013 FINAL   Final  MRSA PCR SCREENING     Status: None   Collection Time    05/06/13  5:01  AM      Result Value Range Status   MRSA by PCR NEGATIVE  NEGATIVE Final   Comment:            The GeneXpert MRSA Assay (FDA     approved for NASAL specimens     only), is one component of a     comprehensive MRSA colonization     surveillance program. It is not     intended to diagnose MRSA     infection nor to guide or     monitor treatment for     MRSA infections.    Medical History: Past Medical History  Diagnosis Date  . GERD (gastroesophageal reflux disease)   . Hypertension   . BPH (benign prostatic hyperplasia)   . Diabetes mellitus without complication   . Constipation     Medications:  Anti-infectives   Start     Dose/Rate Route Frequency Ordered Stop   05/07/13 1600  Ampicillin-Sulbactam (UNASYN) 3 g in sodium chloride 0.9 % 100 mL IVPB     3 g 100 mL/hr over 60 Minutes Intravenous Every 6 hours 05/07/13 1539 05/08/13 2359   05/06/13 2000  ceFEPIme (MAXIPIME) 2 g in dextrose 5 % 50 mL IVPB  Status:  Discontinued     2 g 100 mL/hr over 30 Minutes Intravenous Every 24 hours 05/05/13 1946 05/05/13  2245   05/06/13 2000  vancomycin (VANCOCIN) IVPB 750 mg/150 ml premix  Status:  Discontinued     750 mg 150 mL/hr over 60 Minutes Intravenous Every 24 hours 05/05/13 2245 05/07/13 1532   05/06/13 0400  piperacillin-tazobactam (ZOSYN) IVPB 3.375 g  Status:  Discontinued     3.375 g 12.5 mL/hr over 240 Minutes Intravenous 3 times per day 05/05/13 2245 05/07/13 1532   05/05/13 1908  vancomycin (VANCOCIN) 1,500 mg in sodium chloride 0.9 % 500 mL IVPB     1,500 mg 250 mL/hr over 120 Minutes Intravenous  Once 05/05/13 1908 05/05/13 2234   05/05/13 1815  ceFEPIme (MAXIPIME) 2 g in dextrose 5 % 50 mL IVPB     2 g 100 mL/hr over 30 Minutes Intravenous  Once 05/05/13 1801 05/05/13 2033     Assessment: 89 yom presented with AMS after recent admission to [facility] where he was treated for an enterococcus urosepsis and discharged on unasyn. This was to have continued through 2/11. Upon admission, he was changed to vancomycin and zosyn. Now he is afebrile and WBC is WNL. Cultures here are negative to date. Unasyn to be resumed today to complete previous course.  Unasyn 2/10>> (2/11) Vanc 2/8>>2/10 Zosyn 2/9>>2/10 Cefepime x 1 2/8  2/8 Blood - NGTD 2/9 MRSA - NEG 2/8 Urine - NEG  Goal of Therapy:  Eradication of infection  Plan:  1. Unasyn 3gm IV Q6H to complete tomorrow 2. F/u renal function, C&S, clinical status  Jhayla Podgorski, Drake Leach 05/07/2013,3:42 PM

## 2013-05-07 NOTE — ED Provider Notes (Signed)
Medical screening examination/treatment/procedure(s) were conducted as a shared visit with resident physician and myself.  I personally evaluated the patient during the encounter.   I interviewed and examined the patient. Lung exam shows rales in bilateral lung bases. Cardiac exam wnl. Abdomen soft. Pt is drowsy on exam. Concern for sepsis given fever and recent inpt stay for bacteremia. Will cover empirically w/ abx.   Plan for admission to hospitalist.   Junius ArgyleForrest S Alexandr Yaworski, MD 05/07/13 1352

## 2013-05-07 NOTE — Progress Notes (Signed)
[facility] PROGRESS NOTE  Riley Martinez ZOX:[id number] DOB: [birth year] DOA: 05/05/2013 PCP: No primary provider on file. Brief HPI: Riley Martinez is a 80 y.o. male who was recently admitted at [facility] with sepsis secondary to enterococcus UTI, at that time patient also was found to have decompensated CHF and was eventually discharged to rehabilitation. patient was found to be unresponsive and was brought to the ER. Patient in the ER and CT head which was unremarkable. Labs revealed hypernatremia and hypokalemia with leukocytosis and anemia. Chest x-ray shows possible infiltrates and patient was found to be febrile. He was admitted by medical service and is managed for acute encephalopathy.    Assessment/Plan: 1. Acute encephalopathy secondary to health care associated pneumonia.- possibly secondary to infective and metabolic reasons. At this time blood cultures and urine cultures are been ordered. Patient has been empirically placed on vancomycin and Zosyn. As per patient's son patient is to receive Unasyn till February 11 for recent Enterobacter bacteremia from UTI. Repeat CXR 2 view showed that he has some interstitial edema and no infiltrates. His blood cultures have been negative for 48 hours. Will de escalate vanco and zosyn to unasyn till tomorrow and transition to oral antibiotics.   I have ordered swallow evaluation, reocmmended dysphagia 2 honey thick liquids.  2. Hypernatremia and hypokalemia - probably from dehydration and poor intake and patient being on Lasix. As per patient's son patient has been not eating well over the last few days. At this time we will gently hydrate with half normal saline and closely follow metabolic panel. Since patient has history of CHF closely watch out respiratory status. 3. History of CHF with last EF measured 30-35% per patient's son - this patient was initially hypotensive and presently hypernatremic - patient has been placed on gentle  hydration. Closely follow intake output and metabolic panel. Hold Lasix and antihypertensives for now. 4. Anemia - baseline hemoglobin not known. Patients son states that patient has thalassemia. There was no mention of any GI bleed or black stools. Closely follow CBC. requested patient's charts from [facility]. 5. History of diabetes mellitus type 2 on diet and sliding-scale coverage - closely follow CBGs with sliding-scale coverage. CBG (last 3)   Recent Labs  05/07/13 0739 05/07/13 1209 05/07/13 1617  GLUCAP 123* 166* 128*     6. [facility] for enterococcus UTI sepsis and bacteremia - patient was on Unasyn and was to be continued until February 11. Patient has a PICC line. 7. Has chronic right lower extremity erythema.  Code Status: DO NOT RESUSCITATE.  Family Communication: Patient's son on 2/9 and pt 's niece on 2/10 at bedside.  Disposition Plan: snf when stable.    Consultants:  none  Procedures:  none  Antibiotics:  Vancomycin   Zosyn 2/8  HPI/Subjective: pt remains confused. No new complaints.  Objective: Filed Vitals:   05/07/13 1629  BP: 126/100  Pulse: 106  Temp: 98.2 F (36.8 C)  Resp: 16    Intake/Output Summary (Last 24 hours) at 05/07/13 1745 Last data filed at 05/07/13 1550  Gross per 24 hour  Intake 1696.67 ml  Output      0 ml  Net 1696.67 ml   Filed Weights   05/05/13 1945 05/05/13 2226 05/06/13 2104  Weight: 78.019 kg (172 lb) 77.6 kg (171 lb 1.2 oz) 81.4 kg (179 lb 7.3 oz)    Exam: Alert and sitting on the bed, no distress.  Cardiovascular: S1-S2 heard.  Respiratory: no rhonchi or crepitations.  Abdomen: soft nontender bowel sounds present.  Skin: Skin rash on the right anterior shin which patient's son states is chronic.  Musculoskeletal: no edema   Data Reviewed: Basic Metabolic Panel:  Recent Labs Lab 05/05/13 1823 05/06/13 0430 05/07/13 1030  NA 150* 149* 150*  K 3.1*  3.3* 4.0  CL 109 108 112  CO2 24 25 27   GLUCOSE 132* 114* 153*  BUN 36* 33* 26*  CREATININE 1.28 1.05 1.15  CALCIUM 7.9* 7.9* 8.1*   Liver Function Tests:  Recent Labs Lab 05/05/13 1823 05/06/13 0430  AST 11 11  ALT 14 14  ALKPHOS 34* 32*  BILITOT 0.8 0.8  PROT 6.1 6.2  ALBUMIN 2.3* 2.3*   No results found for this basename: LIPASE, AMYLASE,  in the last 168 hours No results found for this basename: AMMONIA,  in the last 168 hours CBC:  Recent Labs Lab 05/05/13 1823 05/06/13 0430  WBC 8.7 8.7  NEUTROABS 7.3 6.7  HGB 8.1* 8.5*  HCT 25.7* 27.5*  MCV 67.3* 67.4*  PLT 252 244   Cardiac Enzymes: No results found for this basename: CKTOTAL, CKMB, CKMBINDEX, TROPONINI,  in the last 168 hours BNP (last 3 results) No results found for this basename: PROBNP,  in the last 8760 hours CBG:  Recent Labs Lab 05/06/13 1654 05/06/13 2112 05/07/13 0739 05/07/13 1209 05/07/13 1617  GLUCAP 142* 149* 123* 166* 128*    Recent Results (from the past 240 hour(s))  CULTURE, BLOOD (ROUTINE X 2)     Status: None   Collection Time    05/05/13  5:55 PM      Result Value Range Status   Specimen Description BLOOD RIGHT ARM   Final   Special Requests BOTTLES DRAWN AEROBIC ONLY [facility]   Final   Culture  Setup Time     Final   Value: 05/05/2013 23:31     Performed at [facility]   Culture     Final   Value:        BLOOD CULTURE RECEIVED NO GROWTH TO DATE CULTURE WILL BE HELD FOR 5 DAYS BEFORE ISSUING A FINAL NEGATIVE REPORT     Performed at [facility]   Report Status PENDING   Incomplete  CULTURE, BLOOD (ROUTINE X 2)     Status: None   Collection Time    05/05/13  6:00 PM      Result Value Range Status   Specimen Description BLOOD RIGHT HAND   Final   Special Requests BOTTLES DRAWN AEROBIC AND ANAEROBIC 5CC   Final   Culture  Setup Time     Final   Value: 05/05/2013 23:31     Performed at [facility]   Culture     Final   Value:        BLOOD CULTURE  RECEIVED NO GROWTH TO DATE CULTURE WILL BE HELD FOR 5 DAYS BEFORE ISSUING A FINAL NEGATIVE REPORT     Performed at [facility]   Report Status PENDING   Incomplete  URINE CULTURE     Status: None   Collection Time    05/05/13  7:00 PM      Result Value Range Status   Specimen Description URINE, CATHETERIZED   Final   Special Requests NONE   Final   Culture  Setup Time     Final   Value: 05/05/2013 23:32     Performed at [facility]  Colony Count     Final   Value: NO GROWTH     Performed at [facility]   Culture     Final   Value: NO GROWTH     Performed at [facility]   Report Status 05/06/2013 FINAL   Final  MRSA PCR SCREENING     Status: None   Collection Time    05/06/13  5:01 AM      Result Value Range Status   MRSA by PCR NEGATIVE  NEGATIVE Final   Comment:            The GeneXpert MRSA Assay (FDA     approved for NASAL specimens     only), is one component of a     comprehensive MRSA colonization     surveillance program. It is not     intended to diagnose MRSA     infection nor to guide or     monitor treatment for     MRSA infections.     Studies: Dg Chest 2 View  05/07/2013   CLINICAL DATA:  Shortness of breath.  Confusion.  EXAM: CHEST  2 VIEW  COMPARISON:  05/05/2013  FINDINGS: PICC tip is in the right atrium and could be retracted at least 2 cm.  There is new interstitial accentuation as well as a small right effusion. Pulmonary vascularity is more prominent. This probably represents slight pulmonary edema.  There is tortuosity and calcification of the thoracic aorta. No acute osseous abnormality. Severe right and moderate left arthritis of the shoulders.  IMPRESSION: New slight interstitial pulmonary edema.   Electronically Signed   By: Geanie Cooley M.D.   On: 05/07/2013 14:27   Ct Head Wo Contrast  05/05/2013   CLINICAL DATA:  Altered mental status.  EXAM: CT HEAD WITHOUT CONTRAST  TECHNIQUE: Contiguous axial images were  obtained from the base of the skull through the vertex without intravenous contrast.  COMPARISON:  None.  FINDINGS: There is atrophy and chronic small vessel disease changes. Small old lacunar infarct in the right basal ganglia. No acute intracranial abnormality. Specifically, no hemorrhage, hydrocephalus, mass lesion, acute infarction, or significant intracranial injury. No acute calvarial abnormality.  Mucosal thickening within the ethmoid air cells and nasal cavity. Mastoid air cells are clear. Orbital soft tissues are unremarkable.  IMPRESSION: No acute intracranial abnormality.  Atrophy, chronic microvascular disease.   Electronically Signed   By: Charlett Nose M.D.   On: 05/05/2013 20:27   Dg Chest Portable 1 View  05/05/2013   CLINICAL DATA:  Altered mental status since 2 o'clock today. Fever. Sepsis last week. CHF. Hypertension. Diabetes.  EXAM: PORTABLE CHEST - 1 VIEW  COMPARISON:  None.  FINDINGS: Degraded exam secondary to AP portable technique and patient chin overlying the right apex. A right-sided PICC line terminates at cavoatrial junction versus high right atrium.  Advanced glenohumeral joint osteoarthritis, primarily on the right.  Cardiomegaly accentuated by AP portable technique. No pleural fluid. No gross pneumothorax. Low lung volumes with resultant pulmonary interstitial prominence. Right greater the left bibasilar airspace disease.  IMPRESSION: Low lung volumes with right greater the left bibasilar airspace disease. Likely atelectasis. Early infection is difficult to exclude on the right.  Decreased sensitivity and specificity exam due to technique related factors, as described above.   Electronically Signed   By: Jeronimo Greaves M.D.   On: 05/05/2013 18:43    Scheduled Meds: . ampicillin-sulbactam (UNASYN) IV  3 g Intravenous Q6H  .  antiseptic oral rinse  15 mL Mouth Rinse BID  . aspirin EC  81 mg Oral Daily  . clotrimazole  1 application Topical BID  . enoxaparin (LOVENOX) injection   40 mg Subcutaneous Q24H  . feeding supplement (ENSURE COMPLETE)  237 mL Oral BID BM  . fluocinonide-emollient   Topical Custom  . fluticasone  1 spray Each Nare Daily  . insulin aspart  0-9 Units Subcutaneous TID WC  . lactobacillus acidophilus  1 tablet Oral TID  . pantoprazole  40 mg Oral Daily  . polyvinyl alcohol  2 drop Both Eyes TID  . potassium chloride  40 mEq Oral BID  . sennosides-docusate sodium  2 tablet Oral BID  . sodium chloride  3 mL Intravenous Q12H  . tamsulosin  0.4 mg Oral QPC supper  . vitamin B-12  1,000 mcg Oral Daily   Continuous Infusions: . dextrose 5 % and 0.9% NaCl 50 mL/hr at 05/07/13 1307    Principal Problem:   Acute encephalopathy Active Problems:   Diabetes mellitus without complication   Enterococcus UTI   Hypernatremia   Fever   Anemia    Time spent: 35 min    Larri Yehle  [facility] Pager [phone] If 7PM-7AM, please contact night-coverage at [email or web address], password [facility] 05/07/2013, 5:45 PM  LOS: 2 days

## 2013-05-07 NOTE — Clinical Documentation Improvement (Signed)
Possible Clinical Conditions?  Severe Malnutrition   Protein Calorie Malnutrition Severe Protein Calorie Malnutrition Other Condition Cannot clinically determine  Supporting Information: Risk Factors: ASSESSMENT: PMHx significant for GERD, DM, constipation. Recently admitted to OSH with sepsis 2/2 UTI and decompensated CHF, discharged 4 days ago to SNF. Admitted after being found unresponsive, labs revealed hypernatremia, hypokalemia, leukocytosis, and anemia  DOCUMENTATION CODES  Per approved criteria   -Severe malnutrition in the context of acute illness or injury    INTERVENTION: Add Ensure Complete po BID, each supplement provides 350 kcal and 13 grams of protein - please thicken to appropriate consistency. Prefers chocolate. Add Magic cup TID with meals, each supplement provides 290 kcal and 9 grams of protein. RD to continue to follow nutrition care plan.  Diagnostics: Nutrition Assessment by Haynes BastSamantha J Worley, RD at 05/06/2013 12:13 PM  Thank You, Nevin BloodgoodJoan B Merlyn Bollen  RN, BSN, CCDS Clinical Documentation Specialist:  [phone]  [phone] [facility]- Health Information Management

## 2013-05-08 ENCOUNTER — Inpatient Hospital Stay (HOSPITAL_COMMUNITY): Payer: Medicare Other

## 2013-05-08 LAB — BASIC METABOLIC PANEL
BUN: 22 mg/dL (ref 6–23)
CO2: 25 meq/L (ref 19–32)
CREATININE: 1.11 mg/dL (ref 0.50–1.35)
Calcium: 8 mg/dL — ABNORMAL LOW (ref 8.4–10.5)
Chloride: 111 mEq/L (ref 96–112)
GFR, EST AFRICAN AMERICAN: 63 mL/min — AB (ref >90)
GFR, EST NON AFRICAN AMERICAN: 54 mL/min — AB (ref >90)
Glucose, Bld: 154 mg/dL — ABNORMAL HIGH (ref 70–99)
Potassium: 3.6 mEq/L — ABNORMAL LOW (ref 3.7–5.3)
SODIUM: 148 meq/L — AB (ref 137–147)

## 2013-05-08 LAB — GLUCOSE, CAPILLARY
GLUCOSE-CAPILLARY: 148 mg/dL — AB (ref 70–99)
Glucose-Capillary: 163 mg/dL — ABNORMAL HIGH (ref 70–99)
Glucose-Capillary: 158 mg/dL — ABNORMAL HIGH (ref 70–99)
Glucose-Capillary: 136 mg/dL — ABNORMAL HIGH (ref 70–99)

## 2013-05-08 LAB — CBC
HCT: 25.4 % — ABNORMAL LOW (ref 39.0–52.0)
Hemoglobin: 7.8 g/dL — ABNORMAL LOW (ref 13.0–17.0)
MCH: 20.6 pg — ABNORMAL LOW (ref 26.0–34.0)
MCHC: 30.7 g/dL (ref 30.0–36.0)
MCV: 67.2 fL — ABNORMAL LOW (ref 78.0–100.0)
PLATELETS: 248 10*3/uL (ref 150–400)
RBC: 3.78 MIL/uL — AB (ref 4.22–5.81)
RDW: 15.2 % (ref 11.5–15.5)
WBC: 7.2 10*3/uL (ref 4.0–10.5)

## 2013-05-08 MED ORDER — ALTEPLASE 2 MG IJ SOLR
2.0000 mg | Freq: Once | INTRAMUSCULAR | Status: AC
Start: 2013-05-08 — End: 2013-05-08
  Administered 2013-05-08: 2 mg
  Filled 2013-05-08: qty 2

## 2013-05-08 MED ORDER — POTASSIUM CHLORIDE 20 MEQ/15ML (10%) PO LIQD
40.0000 meq | Freq: Two times a day (BID) | ORAL | Status: DC
  Filled 2013-05-08 (×2): qty 30

## 2013-05-08 MED ORDER — POTASSIUM CHLORIDE 20 MEQ/15ML (10%) PO LIQD: 40.0000 meq | Freq: Once | ORAL | Status: DC

## 2013-05-08 MED ORDER — POTASSIUM CHLORIDE CRYS ER 20 MEQ PO TBCR
40.0000 meq | EXTENDED_RELEASE_TABLET | ORAL | Status: AC
  Administered 2013-05-08 (×2): 40 meq via ORAL
  Filled 2013-05-08: qty 2

## 2013-05-08 MED ORDER — FUROSEMIDE 10 MG/ML IJ SOLN
20.0000 mg | Freq: Once | INTRAMUSCULAR | Status: AC
  Administered 2013-05-08: 20 mg via INTRAVENOUS
  Filled 2013-05-08: qty 2

## 2013-05-08 NOTE — Clinical Social Work Psychosocial (Signed)
Clinical Social Work Department BRIEF PSYCHOSOCIAL ASSESSMENT 05/08/2013  Patient:  Riley Martinez,Riley Martinez     Account Number:  [id number]     Admit date:  05/05/2013  Clinical Social Worker:  Riley Martinez,Riley Livermore, LCSW  Date/Time:  05/08/2013 01:02 AM  Referred by:  CSW  Date Referred:  05/06/2013 Referred for  SNF Placement   Other Referral:   Interview type:  Family Other interview type:   CSW talked with patient's son Riley Martinez, [phone]    PSYCHOSOCIAL DATA Living Status:  FACILITY Admitted from facility[facility] Level of care:   Primary support name:  Riley Martinez Primary support relationship to patient:  CHILD, ADULT Degree of support available:   Patient also has another son Riley Martinez.    CURRENT CONCERNS Current Concerns  Post-Acute Placement   Other Concerns:    SOCIAL WORK ASSESSMENT / PLAN CSW visited room and spoke with son as patient was out of room in a procedure. CSW spoke with son regarding discharge plans and to determine if patient will [facility] to continue rehab. Son confirmed that patient will return [facility] and reported that he was only there a couple of days before having to come to hospital. Son also advised CSW that patient will [facility] and then return to his [facility]isted [facility] in [place].   Assessment/plan status:  Psychosocial Support/Ongoing Assessment of Needs Other assessment/ plan:   Information/referral to community resources:   None needed or requested at this time.    PATIENT'S/FAMILY'S RESPONSE TO PLAN OF CARE: Son was pleasant to talking with CSW about discharge planning. Son was clear in advising CSW that [facility] stay [facility], and CSW expressed understanding.

## 2013-05-08 NOTE — Progress Notes (Signed)
Blood Culture : YEAST  NP paged awaiting return call.

## 2013-05-08 NOTE — Progress Notes (Signed)
Physical Therapy Treatment Patient Details Name: Riley Martinez MRN: [id number] DOB: 1914-10-13 Today's Date: 05/08/2013 Time: [id number] PT Time Calculation (min): 39 min  PT Assessment / Plan / Recommendation  History of Present Illness Riley Martinez is a 80 y.o. male who was recently admitted [facility] with sepsis secondary to enterococcus UTI, at that time patient also was found to have decompensated CHF and was eventually discharged to rehabilitation.  Today patient was found to be unresponsive.  Chest x-ray shows possible infiltrates and patient was found to be febrile.    PT Comments   Progressing with ability to take steps forward today.  Still very rigid in trunk, but seems to benefit from ROM activities prior to mobility.  Feel likely has degree of stenosis affecting gait/ LE muscle tone with adductor tightness and abductor weakness in hips.  Will need extensive rehab in SNF setting at d/c.  Time spent discussing with son potential progress versus possible set backs in rehab process.  Follow Up Recommendations  SNF     Does the patient have the potential to tolerate intense rehabilitation   No  Barriers to Discharge  None      Equipment Recommendations  None recommended by PT    Recommendations for Other Services  None  Frequency Min 2X/week   Progress towards PT Goals Progress towards PT goals: Progressing toward goals  Plan Current plan remains appropriate    Precautions / Restrictions Precautions Precautions: Fall Restrictions Weight Bearing Restrictions: No   Pertinent Vitals/Pain C/o back pain with bed flat    Mobility  Bed Mobility Overal bed mobility: Needs Assistance Bed Mobility: Rolling;Sidelying to Sit Rolling: Max assist Sidelying to sit: Max assist;HOB elevated General bed mobility comments: cues to use rail and for technique; heavy assist for all parts of task Transfers Overall transfer level: Needs assistance Equipment used:  Rolling walker (2 wheeled) Transfers: Sit to/from Stand Sit to Stand: Max assist;+2 physical assistance Stand pivot transfers: +2 physical assistance;Mod assist General transfer comment: assist to move walker to turn to step to recliner Ambulation/Gait Ambulation/Gait assistance: Mod assist;+2 physical assistance Ambulation Distance (Feet): 3 Feet Assistive device: Rolling walker (2 wheeled) Gait Pattern/deviations: Scissoring;Shuffle;Decreased stride length;Trunk flexed General Gait Details: when walker pushed ahead and pt cued able to take steps forward few feet, then able to take couple of pivotal steps with assist to move walker, needs chair brought to him due to difficulty backing to chair    Exercises General Exercises - Lower Extremity Ankle Circles/Pumps: AAROM;Both;10 reps;Supine Heel Slides: AAROM;Both;5 reps;Supine Hip ABduction/ADduction: AAROM;Both;5 reps;Supine Other Exercises Other Exercises: trunk rotation in hooklying x 5 reps     PT Goals (current goals can now be found in the care plan section)    Visit Information  Last PT Received On: 05/08/13 Assistance Needed: +2 History of Present Illness: Riley Martinez is a 80 y.o. male who was recently admitted at [facility] with sepsis secondary to enterococcus UTI, at that time patient also was found to have decompensated CHF and was eventually discharged to rehabilitation.  Today patient was found to be unresponsive.  Chest x-ray shows possible infiltrates and patient was found to be febrile.     Subjective Data      Cognition  Cognition Arousal/Alertness: Lethargic Behavior During Therapy: WFL for tasks assessed/performed Overall Cognitive Status: History of cognitive impairments - at baseline    Balance  Balance Sitting-balance support: Bilateral upper extremity supported Sitting balance-Leahy Scale: Poor Sitting balance - Comments: needs  UE assist to sit edge of bed; feet close to floor, but  holds them up off floor Standing balance-Leahy Scale: Poor Standing balance comment: needs UE support and mod assist for balance  End of Session PT - End of Session Equipment Utilized During Treatment: Gait belt Activity Tolerance: Patient limited by fatigue Patient left: in chair;with call bell/phone within reach;with family/visitor present Nurse Communication: Mobility status;Need for lift equipment   GP     Dola Lunsford,CYNDI 05/08/2013, 12:59 PM Sheran Lawless, [place] [phone] 05/08/2013

## 2013-05-08 NOTE — Progress Notes (Signed)
[facility] PROGRESS NOTE  Riley Martinez ZOX:[id number] DOB: 07/12/1914 DOA: 05/05/2013 PCP: No primary provider on file. Brief HPI: Riley Martinez is a 80 y.o. male who was recently admitted at [facility] with sepsis secondary to enterococcus UTI, at that time patient also was found to have decompensated CHF and was eventually discharged to rehabilitation. patient was found to be unresponsive and was brought to the ER. Patient in the ER and CT head which was unremarkable. Labs revealed hypernatremia and hypokalemia with leukocytosis and anemia. Chest x-ray shows possible infiltrates and patient was found to be febrile. He was admitted by medical service and is managed for acute encephalopathy.     Assessment/Plan:   1. Acute encephalopathy secondary to health care associated pneumonia.- possibly secondary to infective and metabolic reasons. We'll finish complete antibiotic treatment today, chest x-ray only shows mild edema, cultures negative afebrile with no leukocytosis.     2. Hypernatremia and hypokalemia - probably from dehydration and poor intake and patient being on Lasix. Sodium improved after hydration, potassium being replaced and monitored.     3. History of chronic systolic CHF with last EF measured 30-35% per patient's son - this patient was initially hypotensive and presently hypernatremic - mild acute on chronic systolic CHF after hydration, improving with gentle Lasix.     4. Anemia - baseline hemoglobin not known. Patients son states that patient has thalassemia. There was no mention of any GI bleed or black stools. Closely follow CBC. requested patient's charts from [facility]. Goal will be to keep hemoglobin above 7 he is asymptomatic.     5. History of diabetes mellitus type 2 on diet and sliding-scale coverage - closely follow CBGs with sliding-scale coverage.   CBG (last 3)   Recent Labs  05/07/13 2155 05/08/13 0748  05/08/13 1144  GLUCAP 166* 148* 163*     6. Recent admission at [facility] for enterococcus UTI sepsis and bacteremia - patient was on Unasyn and was to be continued until February 11. Patient has a PICC line.    7. Has chronic right lower extremity erythema.      Code Status: DO NOT RESUSCITATE.   Family Communication: Patient's son on 2/9 and pt 's niece on 2/11 at bedside.   Disposition Plan: snf when stable.     Consultants:  none   Procedures:  none   Anti-infectives   Start     Dose/Rate Route Frequency Ordered Stop   05/07/13 1600  Ampicillin-Sulbactam (UNASYN) 3 g in sodium chloride 0.9 % 100 mL IVPB     3 g 100 mL/hr over 60 Minutes Intravenous Every 6 hours 05/07/13 1539 05/09/13 0359   05/06/13 2000  ceFEPIme (MAXIPIME) 2 g in dextrose 5 % 50 mL IVPB  Status:  Discontinued     2 g 100 mL/hr over 30 Minutes Intravenous Every 24 hours 05/05/13 1946 05/05/13 2245   05/06/13 2000  vancomycin (VANCOCIN) IVPB 750 mg/150 ml premix  Status:  Discontinued     750 mg 150 mL/hr over 60 Minutes Intravenous Every 24 hours 05/05/13 2245 05/07/13 1532   05/06/13 0400  piperacillin-tazobactam (ZOSYN) IVPB 3.375 g  Status:  Discontinued     3.375 g 12.5 mL/hr over 240 Minutes Intravenous 3 times per day 05/05/13 2245 05/07/13 1532   05/05/13 1908  vancomycin (VANCOCIN) 1,500 mg in sodium chloride 0.9 % 500 mL IVPB     1,500 mg 250 mL/hr over 120 Minutes Intravenous  Once 05/05/13 1908  05/05/13 2234   05/05/13 1815  ceFEPIme (MAXIPIME) 2 g in dextrose 5 % 50 mL IVPB     2 g 100 mL/hr over 30 Minutes Intravenous  Once 05/05/13 1801 05/05/13 2033      HPI/Subjective: pt remains confused. No new complaints.  Objective: Filed Vitals:   05/08/13 1146  BP: 149/83  Pulse: 80  Temp: 97.2 F (36.2 C)  Resp: 18    Intake/Output Summary (Last 24 hours) at 05/08/13 1403 Last data filed at 05/08/13 0900  Gross per 24 hour  Intake    900 ml  Output       0 ml  Net    900 ml   Filed Weights   05/05/13 2226 05/06/13 2104 05/07/13 2215  Weight: 77.6 kg (171 lb 1.2 oz) 81.4 kg (179 lb 7.3 oz) 81.1 kg (178 lb 12.7 oz)    Exam: Alert and sitting on the bed, no distress.  Cardiovascular: S1-S2 heard.  Respiratory: no rhonchi or crepitations.  Abdomen: soft nontender bowel sounds present.  Skin: Skin rash on the right anterior shin which patient's son states is chronic.  Musculoskeletal: no edema   Data Reviewed: Basic Metabolic Panel:  Recent Labs Lab 05/05/13 1823 05/06/13 0430 05/07/13 1030 05/08/13 0455  NA 150* 149* 150* 148*  K 3.1* 3.3* 4.0 3.6*  CL 109 108 112 111  CO2 24 25 27 25   GLUCOSE 132* 114* 153* 154*  BUN 36* 33* 26* 22  CREATININE 1.28 1.05 1.15 1.11  CALCIUM 7.9* 7.9* 8.1* 8.0*   Liver Function Tests:  Recent Labs Lab 05/05/13 1823 05/06/13 0430  AST 11 11  ALT 14 14  ALKPHOS 34* 32*  BILITOT 0.8 0.8  PROT 6.1 6.2  ALBUMIN 2.3* 2.3*   No results found for this basename: LIPASE, AMYLASE,  in the last 168 hours No results found for this basename: AMMONIA,  in the last 168 hours CBC:  Recent Labs Lab 05/05/13 1823 05/06/13 0430 05/08/13 0455  WBC 8.7 8.7 7.2  NEUTROABS 7.3 6.7  --   HGB 8.1* 8.5* 7.8*  HCT 25.7* 27.5* 25.4*  MCV 67.3* 67.4* 67.2*  PLT 252 244 248   Cardiac Enzymes: No results found for this basename: CKTOTAL, CKMB, CKMBINDEX, TROPONINI,  in the last 168 hours BNP (last 3 results) No results found for this basename: PROBNP,  in the last 8760 hours CBG:  Recent Labs Lab 05/07/13 1209 05/07/13 1617 05/07/13 2155 05/08/13 0748 05/08/13 1144  GLUCAP 166* 128* 166* 148* 163*    Recent Results (from the past 240 hour(s))  CULTURE, BLOOD (ROUTINE X 2)     Status: None   Collection Time    05/05/13  5:55 PM      Result Value Ref Range Status   Specimen Description BLOOD RIGHT ARM   Final   Special Requests BOTTLES DRAWN AEROBIC ONLY [facility]   Final   Culture  Setup  Time     Final   Value: 05/05/2013 23:31     Performed at [facility]   Culture     Final   Value:        BLOOD CULTURE RECEIVED NO GROWTH TO DATE CULTURE WILL BE HELD FOR 5 DAYS BEFORE ISSUING A FINAL NEGATIVE REPORT     Performed at [facility]   Report Status PENDING   Incomplete  CULTURE, BLOOD (ROUTINE X 2)     Status: None   Collection Time    05/05/13  6:00  PM      Result Value Ref Range Status   Specimen Description BLOOD RIGHT HAND   Final   Special Requests BOTTLES DRAWN AEROBIC AND ANAEROBIC 5CC   Final   Culture  Setup Time     Final   Value: 05/05/2013 23:31     [facility]   Culture     Final   Value:        BLOOD CULTURE RECEIVED NO GROWTH TO DATE CULTURE WILL BE HELD FOR 5 DAYS BEFORE ISSUING A FINAL NEGATIVE REPORT     [facility]formed at [facility]   Report Status PENDING   Incomplete  URINE CULTURE     Status: None   Collection Time    05/05/13  7:00 PM      Result Value Ref Range Status   Specimen Description URINE, CATHETERIZED   Final   Special Requests NONE   Final   Culture  Setup Time     Final   Value: 05/05/2013 23:32     Performed at [facility]   Colony Count     Final   Value: NO GROWTH     [facility]formed at [facility]   Culture     Final   Value: NO GROWTH     [facility]formed at [facility]   Report Status 05/06/2013 FINAL   Final  MRSA PCR SCREENING     Status: None   Collection Time    05/06/13  5:01 AM      Result Value Ref Range Status   MRSA by PCR NEGATIVE  NEGATIVE Final   Comment:            The GeneXpert MRSA Assay (FDA     approved for NASAL specimens     only), is one component of a     comprehensive MRSA colonization     surveillance program. It is not     intended to diagnose MRSA     infection nor to guide or     monitor treatment for     MRSA infections.     Studies: Dg Chest 1 View  05/08/2013   CLINICAL DATA:  Shortness of breath.  EXAM: CHEST - 1 VIEW   COMPARISON:  Chest x-ray 05/07/2013.  FINDINGS: There is a right upper extremity PICC with tip terminating in the right atrium. Skin fold artifact in the right hemithorax. Lung volumes are slightly low. No definite consolidative airspace disease. No pleural effusions. Diffuse peribronchial cuffing. Mild cephalization of the pulmonary vasculature with indistinct interstitial markings suggesting mild interstitial pulmonary edema. Mild cardiomegaly. The patient is rotated to the right on today's exam, resulting in distortion of the mediastinal contours and reduced diagnostic sensitivity and specificity for mediastinal pathology. Atherosclerosis in the thoracic aorta.  IMPRESSION: 1. The appearance the chest suggests mild congestive heart failure, as above. 2. Atherosclerosis. 3. Unchanged right upper extremity PICC.   Electronically Signed   By: Trudie Reedaniel  Entrikin M.D.   On: 05/08/2013 11:59   Dg Chest 2 View  05/07/2013   CLINICAL DATA:  Shortness of breath.  Confusion.  EXAM: CHEST  2 VIEW  COMPARISON:  05/05/2013  FINDINGS: PICC tip is in the right atrium and could be retracted at least 2 cm.  There is new interstitial accentuation as well as a small right effusion. Pulmonary vascularity is more prominent. This probably represents slight pulmonary edema.  There is tortuosity and calcification of the thoracic aorta. No  acute osseous abnormality. Severe right and moderate left arthritis of the shoulders.  IMPRESSION: New slight interstitial pulmonary edema.   Electronically Signed   By: Geanie Cooley M.D.   On: 05/07/2013 14:27   Dg Thoracolumbar Spine  05/08/2013   CLINICAL DATA:  Pain all over.  EXAM: THORACOLUMBAR SPINE - 2 VIEW  COMPARISON:  No priors.  FINDINGS: Lateral view of the thoracolumbar spine incompletely visualizes the upper thoracic spine from T1 through T3 and the lower lumbar spine from L4 through S1. On the frontal projection, the thoracic spine is incompletely visualized from T1 through T6. With  these limitations in mind, the visualized portions of the thorax the lumbar spine demonstrate no definite acute displaced fractures or compression type fractures. There is multilevel degenerative disc disease throughout the thoracolumbar spine. Multilevel facet arthropathy in the lumbar spine. Severe atherosclerosis is noted.  IMPRESSION: 1. Limited study demonstrating no definite acute radiographic abnormality of the visualized portions of the thoracolumbar spine, as discussed above.   Electronically Signed   By: Trudie Reed M.D.   On: 05/08/2013 11:55    Scheduled Meds: . ampicillin-sulbactam (UNASYN) IV  3 g Intravenous Q6H  . antiseptic oral rinse  15 mL Mouth Rinse BID  . aspirin EC  81 mg Oral Daily  . clotrimazole  1 application Topical BID  . enoxaparin (LOVENOX) injection  40 mg Subcutaneous Q24H  . feeding supplement (ENSURE COMPLETE)  237 mL Oral BID BM  . fluocinonide-emollient   Topical 2 times per day on Mon Tue Wed Thu  . fluticasone  1 spray Each Nare Daily  . furosemide  20 mg Intravenous Once  . insulin aspart  0-9 Units Subcutaneous TID WC  . lactobacillus acidophilus  1 tablet Oral TID  . pantoprazole  40 mg Oral Daily  . polyvinyl alcohol  2 drop Both Eyes TID  . sennosides-docusate sodium  2 tablet Oral BID  . sodium chloride  3 mL Intravenous Q12H  . tamsulosin  0.4 mg Oral QPC supper  . vitamin B-12  1,000 mcg Oral Daily   Continuous Infusions: . dextrose 50 mL/hr at 05/08/13 1402    Principal Problem:   Acute encephalopathy Active Problems:   Diabetes mellitus without complication   Enterococcus UTI   Hypernatremia   Fever   Anemia    Time spent: 35 min    SINGH,PRASHANT K  [facility] Pager [phone] If 7PM-7AM, please contact night-coverage at [email or web address], password [facility] 05/08/2013, 2:03 PM  LOS: 3 days

## 2013-05-09 DIAGNOSIS — M545 Low back pain, unspecified: Secondary | ICD-10-CM

## 2013-05-09 DIAGNOSIS — B49 Unspecified mycosis: Secondary | ICD-10-CM | POA: Diagnosis present

## 2013-05-09 LAB — POTASSIUM: Potassium: 4.7 mEq/L (ref 3.7–5.3)

## 2013-05-09 LAB — URINALYSIS, ROUTINE W REFLEX MICROSCOPIC
Bilirubin Urine: NEGATIVE
Glucose, UA: NEGATIVE mg/dL
Ketones, ur: NEGATIVE mg/dL
Nitrite: NEGATIVE
PROTEIN: NEGATIVE mg/dL
Specific Gravity, Urine: 1.014 (ref 1.005–1.030)
Urobilinogen, UA: 0.2 mg/dL (ref 0.0–1.0)
pH: 5.5 (ref 5.0–8.0)

## 2013-05-09 LAB — URINE MICROSCOPIC-ADD ON

## 2013-05-09 LAB — GLUCOSE, CAPILLARY
GLUCOSE-CAPILLARY: 145 mg/dL — AB (ref 70–99)
GLUCOSE-CAPILLARY: 133 mg/dL — AB (ref 70–99)
Glucose-Capillary: 147 mg/dL — ABNORMAL HIGH (ref 70–99)
Glucose-Capillary: 152 mg/dL — ABNORMAL HIGH (ref 70–99)

## 2013-05-09 MED ORDER — SODIUM CHLORIDE 0.9 % IV SOLN
100.0000 mg | Freq: Every day | INTRAVENOUS | Status: DC
  Administered 2013-05-09 – 2013-05-15 (×7): 100 mg via INTRAVENOUS
  Filled 2013-05-09 (×8): qty 100

## 2013-05-09 MED ORDER — PANTOPRAZOLE SODIUM 40 MG PO PACK
40.0000 mg | PACK | Freq: Every day | ORAL | Status: DC
  Administered 2013-05-10 – 2013-05-14 (×5): 40 mg via ORAL
  Filled 2013-05-09 (×8): qty 20

## 2013-05-09 NOTE — Progress Notes (Signed)
Son keeps coming out to get the nurse every time the patient moves. Son very anal about giving patient pain medications. Patient in bed at present resting with his eyes closed. Patient is alert and oriented to self only  and talking to writer with good spirits earlier.

## 2013-05-09 NOTE — Consult Note (Signed)
[facility]    Date of Admission:  05/05/2013     Total days of antibiotics 18        Day 1 micafungin               Reason for Consult: Fever and fungemia    Referring Physician: Dr. Susa RaringPrashant Singh  Principal Problem:   Fever Active Problems:   Fungemia   Bacteremia due to Enterococcus   Acute low back pain   Diabetes mellitus without complication   Acute encephalopathy   Hypernatremia   Anemia   . antiseptic oral rinse  15 mL Mouth Rinse BID  . aspirin EC  81 mg Oral Daily  . clotrimazole  1 application Topical BID  . enoxaparin (LOVENOX) injection  40 mg Subcutaneous Q24H  . feeding supplement (ENSURE COMPLETE)  237 mL Oral BID BM  . fluocinonide-emollient   Topical 2 times per day on Mon Tue Wed Thu  . fluticasone  1 spray Each Nare Daily  . insulin aspart  0-9 Units Subcutaneous TID WC  . lactobacillus acidophilus  1 tablet Oral TID  . micafungin [facility](MYCAMINE) IV  100 mg Intravenous Daily  . pantoprazole sodium  40 mg Oral Daily  . polyvinyl alcohol  2 drop Both Eyes TID  . sennosides-docusate sodium  2 tablet Oral BID  . sodium chloride  3 mL Intravenous Q12H  . tamsulosin  0.4 mg Oral QPC supper  . vitamin B-12  1,000 mcg Oral Daily    Recommendations: 1. Continue micafungin pending final results of admission blood cultures 2. Await results of repeat blood cultures 3. Evaluate low back pain when he becomes more lucid   Assessment: I suspect that his recent fevers are probably due to PICC line associated fungemia. I agree with micafungin pending speciation of the yeast and repeat blood cultures. I am also concerned about his recent acute low back pain. Hopefully he will become more lucid once his fever goes down and he is off around the clock tramadol. If he continues to have severe constant low back pain I would consider MRI scan looking for any evidence of spine infection possibly related to his recent intercarpal bacteremia.  I will followup tomorrow.   HPI: Riley Martinez is a 80 y.o. male who became weak and sustained a fall about 2-1/2 weeks ago. [facility] where he was told that he had a urinary tract infection. He was started on oral antibiotics but the following day he was found lethargic and poorly responsive in his wheelchair and was taken back to the hospital for admission. It sounds like he was diagnosed with enterococcal bacteremia and UTI. He improved with IV Unasyn and was discharged with a PICC and continued Unasyn therapy.  About 5 days ago he began to have fever again. He became lethargic and was admitted here. He had been having some acute low back pain for the past week. He had no further falls or other injuries. He has no history of severe low back pain. His son did not notice any new cough, shortness of breath, sore throat, diarrhea or dysuria. He was not aware of any problems with the PICC line.  He was febrile on admission here on February 8 with a temperature of 101.9 degrees. He was hypotensive, hyponatremic, hypokalemic and had leukocytosis. One of 2 admission blood cultures have grown yeast. He completed Unasyn  therapy last night. His right arm PICC has been removed and cultures have been repeated.  He has been receiving around-the-clock Toradol alternating with tramadol. He has been more lethargic this afternoon. His fever spiked again on this afternoon to 102.4.   Review of Systems: Review of systems not obtained due to patient factors.  Past Medical History  Diagnosis Date  . GERD (gastroesophageal reflux disease)   . Hypertension   . BPH (benign prostatic hyperplasia)   . Diabetes mellitus without complication   . Constipation     History  Substance Use Topics  . Smoking status: Former Games developer  . Smokeless tobacco: Not on file  . Alcohol Use: Yes     Comment: occasionally has not had for years.    Family History    Problem Relation Age of Onset  . Diabetes Mellitus II Mother    Allergies  Allergen Reactions  . Ativan [Lorazepam] Other (See Comments)    unknown    OBJECTIVE: Blood pressure 159/78, pulse 99, temperature 100.1 F (37.8 C), temperature source Oral, resp. rate 23, height 5\' 4"  (1.626 m), weight 81.285 kg (179 lb 3.2 oz), SpO2 93.00%. General: He is very lethargic and does not open eyes or respond to questions  Skin: No acute rash  Oral: Some missing teeth. No oropharyngeal lesions noted  Neck: Appears supple  Lungs: Clear  Cor: Distant but regular S1 and S2 no murmurs  Abdomen: Soft Joints and extremities: No acute abnormalities noted  Lab Results Lab Results  Component Value Date   WBC 7.2 05/08/2013   HGB 7.8* 05/08/2013   HCT 25.4* 05/08/2013   MCV 67.2* 05/08/2013   PLT 248 05/08/2013    Lab Results  Component Value Date   CREATININE 1.11 05/08/2013   BUN 22 05/08/2013   NA 148* 05/08/2013   K 4.7 05/09/2013   CL 111 05/08/2013   CO2 25 05/08/2013    Lab Results  Component Value Date   ALT 14 05/06/2013   AST 11 05/06/2013   ALKPHOS 32* 05/06/2013   BILITOT 0.8 05/06/2013     Microbiology: Recent Results (from the past 240 hour(s))  CULTURE, BLOOD (ROUTINE X 2)     Status: None   Collection Time    05/05/13  5:55 PM      Result Value Ref Range Status   Specimen Description BLOOD RIGHT ARM   Final   Special Requests BOTTLES DRAWN AEROBIC ONLY [facility]   Final   Culture  Setup Time     Final   Value: 05/05/2013 23:31     Performed at [facility]   Culture     Final   Value: YEAST     Note: Gram Stain Report Called to,Read Back By and Verified With: EMMANUEL CASTRO ON 05/08/2013 AT 9:25P BY WILEJ     Performed at [facility]   Report Status PENDING   Incomplete  CULTURE, BLOOD (ROUTINE X 2)     Status: None   Collection Time    05/05/13  6:00 PM      Result Value Ref Range Status   Specimen Description BLOOD RIGHT HAND   Final   Special Requests  BOTTLES DRAWN AEROBIC AND ANAEROBIC 5CC   Final   Culture  Setup Time     Final   Value: 05/05/2013 23:31     Performed at [facility]   Culture     Final   Value:  BLOOD CULTURE RECEIVED NO GROWTH TO DATE CULTURE WILL BE HELD FOR 5 DAYS BEFORE ISSUING A FINAL NEGATIVE REPORT     Performed at [facility]   Report Status PENDING   Incomplete  URINE CULTURE     Status: None   Collection Time    05/05/13  7:00 PM      Result Value Ref Range Status   Specimen Description URINE, CATHETERIZED   Final   Special Requests NONE   Final   Culture  Setup Time     Final   Value: 05/05/2013 23:32     Performed at [facility] Count     Final   Value: NO GROWTH     Performed at [facility]   Culture     Final   Value: NO GROWTH     Performed at [facility]   Report Status 05/06/2013 FINAL   Final  MRSA PCR SCREENING     Status: None   Collection Time    05/06/13  5:01 AM      Result Value Ref Range Status   MRSA by PCR NEGATIVE  NEGATIVE Final   Comment:            The GeneXpert MRSA Assay (FDA     approved for NASAL specimens     only), is one component of a     comprehensive MRSA colonization     surveillance program. It is not     intended to diagnose MRSA     infection nor to guide or     monitor treatment for     MRSA infections.    Cliffton Asters, MD [facility] [phone] pager   [phone] cell 05/09/2013, 6:33 PM

## 2013-05-09 NOTE — Progress Notes (Signed)
05/09/13 nsg 1535 Dr. Thedore MinsSingh notified of patient's T 102.4 recheck after Tylenol suppository  T 101. Blood culture done in am. Dr. Orvan Falconerampbell aware. Orders noted.

## 2013-05-09 NOTE — Progress Notes (Signed)
[facility] PROGRESS NOTE  Dino Borntreger ZOX:[id number] DOB: June 27, 1914 DOA: 05/05/2013 PCP: No primary provider on file.   Brief HPI:   Riley Martinez is a 80 y.o. male who was recently admitted at [facility] with sepsis secondary to enterococcus UTI, at that time patient also was found to have decompensated CHF and was eventually discharged to rehabilitation. patient was found to be unresponsive and was brought to the ER. Patient in the ER and CT head which was unremarkable. Labs revealed hypernatremia and hypokalemia with leukocytosis and anemia. Chest x-ray shows possible infiltrates and patient was found to be febrile. He was admitted by medical service and is managed for acute encephalopathy.     Assessment/Plan:   1. Acute encephalopathy secondary to health care associated pneumonia.- possibly secondary to infective and metabolic reasons. Has finished his antibiotic course, chest x-ray only shows mild edema, cultures negative afebrile with no leukocytosis.     2. Hypernatremia and hypokalemia - probably from dehydration and poor intake and patient being on Lasix. Sodium improved after hydration, potassium replaced and monitored.     3. History of chronic systolic CHF with last EF measured 30-35% per patient's son - this patient was initially hypotensive and presently hypernatremic - mild acute on chronic systolic CHF after hydration, improving with gentle Lasix.     4. Anemia - baseline hemoglobin not known. Patients son states that patient has thalassemia. There was no mention of any GI bleed or black stools. Closely follow CBC. requested patient's charts from [facility]. Goal will be to keep hemoglobin above 7 he is asymptomatic.     5. History of diabetes mellitus type 2 on diet and sliding-scale coverage - closely follow CBGs with sliding-scale coverage.   CBG (last 3)   Recent Labs  05/08/13 1645 05/08/13 2125 05/09/13 0753   GLUCAP 158* 136* 147*     6. Recent admission at [facility] for enterococcus UTI sepsis and bacteremia - patient was on Unasyn and was to be continued until February 11. Patient has a PICC line.    7. Has chronic right lower extremity erythema.    8. Fungemia noted on blood cultures 05/08/2013, this is likely due to prolonged antibiotics which required PICC line placement, PICC line will be removed,     he has finished his antibiotics anyways, placed on micafungin. ID consult       Code Status: DO NOT RESUSCITATE.   Family Communication: Patient's son on 2/11-12    Disposition Plan: snf when stable.     Consultants:  ID   Procedures:   PICC line at [facility]     Anti-infectives   Start     Dose/Rate Route Frequency Ordered Stop   05/09/13 1000  micafungin (MYCAMINE) 100 mg in sodium chloride 0.9 % 100 mL IVPB     100 mg 100 mL/hr over 1 Hours Intravenous Daily 05/09/13 0822     05/07/13 1600  Ampicillin-Sulbactam (UNASYN) 3 g in sodium chloride 0.9 % 100 mL IVPB     3 g 100 mL/hr over 60 Minutes Intravenous Every 6 hours 05/07/13 1539 05/08/13 2356   05/06/13 2000  ceFEPIme (MAXIPIME) 2 g in dextrose 5 % 50 mL IVPB  Status:  Discontinued     2 g 100 mL/hr over 30 Minutes Intravenous Every 24 hours 05/05/13 1946 05/05/13 2245   05/06/13 2000  vancomycin (VANCOCIN) IVPB 750 mg/150 ml premix  Status:  Discontinued     750 mg 150 mL/hr  over 60 Minutes Intravenous Every 24 hours 05/05/13 2245 05/07/13 1532   05/06/13 0400  piperacillin-tazobactam (ZOSYN) IVPB 3.375 g  Status:  Discontinued     3.375 g 12.5 mL/hr over 240 Minutes Intravenous 3 times per day 05/05/13 2245 05/07/13 1532   05/05/13 1908  vancomycin (VANCOCIN) 1,500 mg in sodium chloride 0.9 % 500 mL IVPB     1,500 mg 250 mL/hr over 120 Minutes Intravenous  Once 05/05/13 1908 05/05/13 2234   05/05/13 1815  ceFEPIme (MAXIPIME) 2 g in dextrose 5 % 50 mL IVPB     2 g 100 mL/hr over 30  Minutes Intravenous  Once 05/05/13 1801 05/05/13 2033      HPI/Subjective: pt remains confused. No new complaints.  Objective: Filed Vitals:   05/09/13 0903  BP: 139/84  Pulse: 105  Temp: 98.8 F (37.1 C)  Resp: 22    Intake/Output Summary (Last 24 hours) at 05/09/13 1117 Last data filed at 05/09/13 0953  Gross per 24 hour  Intake 2507.5 ml  Output      0 ml  Net 2507.5 ml   Filed Weights   05/06/13 2104 05/07/13 2215 05/08/13 2100  Weight: 81.4 kg (179 lb 7.3 oz) 81.1 kg (178 lb 12.7 oz) 81.285 kg (179 lb 3.2 oz)    Exam: Alert and sitting on the bed, no distress.  Cardiovascular: S1-S2 heard.  Respiratory: no rhonchi or crepitations.  Abdomen: soft nontender bowel sounds present.  Skin: Skin rash on the right anterior shin which patient's son states is chronic.  Musculoskeletal: no edema   Data Reviewed: Basic Metabolic Panel:  Recent Labs Lab 05/05/13 1823 05/06/13 0430 05/07/13 1030 05/08/13 0455 05/09/13 0445  NA 150* 149* 150* 148*  --   K 3.1* 3.3* 4.0 3.6* 4.7  CL 109 108 112 111  --   CO2 24 25 27 25   --   GLUCOSE 132* 114* 153* 154*  --   BUN 36* 33* 26* 22  --   CREATININE 1.28 1.05 1.15 1.11  --   CALCIUM 7.9* 7.9* 8.1* 8.0*  --    Liver Function Tests:  Recent Labs Lab 05/05/13 1823 05/06/13 0430  AST 11 11  ALT 14 14  ALKPHOS 34* 32*  BILITOT 0.8 0.8  PROT 6.1 6.2  ALBUMIN 2.3* 2.3*   No results found for this basename: LIPASE, AMYLASE,  in the last 168 hours No results found for this basename: AMMONIA,  in the last 168 hours CBC:  Recent Labs Lab 05/05/13 1823 05/06/13 0430 05/08/13 0455  WBC 8.7 8.7 7.2  NEUTROABS 7.3 6.7  --   HGB 8.1* 8.5* 7.8*  HCT 25.7* 27.5* 25.4*  MCV 67.3* 67.4* 67.2*  PLT 252 244 248   Cardiac Enzymes: No results found for this basename: CKTOTAL, CKMB, CKMBINDEX, TROPONINI,  in the last 168 hours BNP (last 3 results) No results found for this basename: PROBNP,  in the last 8760  hours CBG:  Recent Labs Lab 05/08/13 0748 05/08/13 1144 05/08/13 1645 05/08/13 2125 05/09/13 0753  GLUCAP 148* 163* 158* 136* 147*    Recent Results (from the past 240 hour(s))  CULTURE, BLOOD (ROUTINE X 2)     Status: None   Collection Time    05/05/13  5:55 PM      Result Value Ref Range Status   Specimen Description BLOOD RIGHT ARM   Final   Special Requests BOTTLES DRAWN AEROBIC ONLY [facility]   Final   Culture  Setup Time  Final   Value: 05/05/2013 23:31     Performed at [facility]   Culture     Final   Value: YEAST     Note: Gram Stain Report Called to,Read Back By and Verified With: EMMANUEL CASTRO ON 05/08/2013 AT 9:25P BY [employer]     Performed at [facility]   Report Status PENDING   Incomplete  CULTURE, BLOOD (ROUTINE X 2)     Status: None   Collection Time    05/05/13  6:00 PM      Result Value Ref Range Status   Specimen Description BLOOD RIGHT HAND   Final   Special Requests BOTTLES DRAWN AEROBIC AND ANAEROBIC 5CC   Final   Culture  Setup Time     Final   Value: 05/05/2013 23:31     Performed at [facility]   Culture     Final   Value:        BLOOD CULTURE RECEIVED NO GROWTH TO DATE CULTURE WILL BE HELD FOR 5 DAYS BEFORE ISSUING A FINAL NEGATIVE REPORT     Performed at [facility]   Report Status PENDING   Incomplete  URINE CULTURE     Status: None   Collection Time    05/05/13  7:00 PM      Result Value Ref Range Status   Specimen Description URINE, CATHETERIZED   Final   Special Requests NONE   Final   Culture  Setup Time     Final   Value: 05/05/2013 23:32     Performed at [facility] Count     Final   Value: NO GROWTH     Performed at [facility]   Culture     Final   Value: NO GROWTH     Performed at [facility]   Report Status 05/06/2013 FINAL   Final  MRSA PCR SCREENING     Status: None   Collection Time    05/06/13  5:01 AM      Result Value Ref Range Status   MRSA by  PCR NEGATIVE  NEGATIVE Final   Comment:            The GeneXpert MRSA Assay (FDA     approved for NASAL specimens     only), is one component of a     comprehensive MRSA colonization     surveillance program. It is not     intended to diagnose MRSA     infection nor to guide or     monitor treatment for     MRSA infections.     Studies: Dg Chest 1 View  05/08/2013   CLINICAL DATA:  Shortness of breath.  EXAM: CHEST - 1 VIEW  COMPARISON:  Chest x-ray 05/07/2013.  FINDINGS: There is a right upper extremity PICC with tip terminating in the right atrium. Skin fold artifact in the right hemithorax. Lung volumes are slightly low. No definite consolidative airspace disease. No pleural effusions. Diffuse peribronchial cuffing. Mild cephalization of the pulmonary vasculature with indistinct interstitial markings suggesting mild interstitial pulmonary edema. Mild cardiomegaly. The patient is rotated to the right on today's exam, resulting in distortion of the mediastinal contours and reduced diagnostic sensitivity and specificity for mediastinal pathology. Atherosclerosis in the thoracic aorta.  IMPRESSION: 1. The appearance the chest suggests mild congestive heart failure, as above. 2. Atherosclerosis. 3. Unchanged right upper extremity PICC.   Electronically Signed   By:  Trudie Reedaniel  Entrikin M.D.   On: 05/08/2013 11:59   Dg Chest 2 View  05/07/2013   CLINICAL DATA:  Shortness of breath.  Confusion.  EXAM: CHEST  2 VIEW  COMPARISON:  05/05/2013  FINDINGS: PICC tip is in the right atrium and could be retracted at least 2 cm.  There is new interstitial accentuation as well as a small right effusion. Pulmonary vascularity is more prominent. This probably represents slight pulmonary edema.  There is tortuosity and calcification of the thoracic aorta. No acute osseous abnormality. Severe right and moderate left arthritis of the shoulders.  IMPRESSION: New slight interstitial pulmonary edema.   Electronically Signed    By: Geanie CooleyJim  Maxwell M.D.   On: 05/07/2013 14:27   Dg Thoracolumbar Spine  05/08/2013   CLINICAL DATA:  Pain all over.  EXAM: THORACOLUMBAR SPINE - 2 VIEW  COMPARISON:  No priors.  FINDINGS: Lateral view of the thoracolumbar spine incompletely visualizes the upper thoracic spine from T1 through T3 and the lower lumbar spine from L4 through S1. On the frontal projection, the thoracic spine is incompletely visualized from T1 through T6. With these limitations in mind, the visualized portions of the thorax the lumbar spine demonstrate no definite acute displaced fractures or compression type fractures. There is multilevel degenerative disc disease throughout the thoracolumbar spine. Multilevel facet arthropathy in the lumbar spine. Severe atherosclerosis is noted.  IMPRESSION: 1. Limited study demonstrating no definite acute radiographic abnormality of the visualized portions of the thoracolumbar spine, as discussed above.   Electronically Signed   By: Trudie Reedaniel  Entrikin M.D.   On: 05/08/2013 11:55    Scheduled Meds: . antiseptic oral rinse  15 mL Mouth Rinse BID  . aspirin EC  81 mg Oral Daily  . clotrimazole  1 application Topical BID  . enoxaparin (LOVENOX) injection  40 mg Subcutaneous Q24H  . feeding supplement (ENSURE COMPLETE)  237 mL Oral BID BM  . fluocinonide-emollient   Topical 2 times per day on Mon Tue Wed Thu  . fluticasone  1 spray Each Nare Daily  . insulin aspart  0-9 Units Subcutaneous TID WC  . lactobacillus acidophilus  1 tablet Oral TID  . micafungin [facility](MYCAMINE) IV  100 mg Intravenous Daily  . pantoprazole sodium  40 mg Oral Daily  . polyvinyl alcohol  2 drop Both Eyes TID  . sennosides-docusate sodium  2 tablet Oral BID  . sodium chloride  3 mL Intravenous Q12H  . tamsulosin  0.4 mg Oral QPC supper  . vitamin B-12  1,000 mcg Oral Daily   Continuous Infusions: . dextrose 50 mL/hr at 05/09/13 1056    Principal Problem:   Acute encephalopathy Active Problems:   Diabetes  mellitus without complication   Enterococcus UTI   Hypernatremia   Fever   Anemia    Time spent: 35 min    SINGH,PRASHANT K  [facility] Pager [phone] If 7PM-7AM, please contact night-coverage at [email or web address], password [email or web address] 05/09/2013, 11:17 AM  LOS: 4 days

## 2013-05-09 NOTE — Progress Notes (Signed)
05/09/13 0841 nsg  patient is high fall risk bed alarm not on patient does not get off bed ; son in room all the time.

## 2013-05-09 NOTE — Care Management Note (Signed)
   CARE MANAGEMENT NOTE 05/09/2013  Patient:  Riley MouseMANDRANO,Riley   Account Number:  [id number]  Date Initiated:  05/09/2013  Documentation initiated by:  Johny ShockOYAL,Bernerd Terhune  Subjective/Objective Assessment:   05/09/13 Pt is resident of [facility] and will return to that facility.     Action/Plan:   Antibiotics completed 2/11 now with yeast + blood cultures, micrafungin started.   Anticipated DC Date:  05/12/2013   Anticipated DC Plan:  HOME W HOME HEALTH SERVICES         Choice offered to / List presented to:             Status of service:  In process, will continue to follow Medicare Important Message given?   (If response is "NO", the following Medicare IM given date fields will be blank) Date Medicare IM given:   Date Additional Medicare IM given:    Discharge Disposition:    Per UR Regulation:    If discussed at Long Length of Stay Meetings, dates discussed:    Comments:

## 2013-05-10 DIAGNOSIS — R7881 Bacteremia: Secondary | ICD-10-CM

## 2013-05-10 DIAGNOSIS — R509 Fever, unspecified: Secondary | ICD-10-CM

## 2013-05-10 DIAGNOSIS — B49 Unspecified mycosis: Secondary | ICD-10-CM

## 2013-05-10 LAB — GLUCOSE, CAPILLARY
GLUCOSE-CAPILLARY: 139 mg/dL — AB (ref 70–99)
GLUCOSE-CAPILLARY: 131 mg/dL — AB (ref 70–99)
GLUCOSE-CAPILLARY: 134 mg/dL — AB (ref 70–99)
Glucose-Capillary: 132 mg/dL — ABNORMAL HIGH (ref 70–99)

## 2013-05-10 MED ORDER — TAMSULOSIN HCL 0.4 MG PO CAPS
0.4000 mg | ORAL_CAPSULE | Freq: Every day | ORAL | Status: DC
  Administered 2013-05-10 – 2013-05-15 (×6): 0.4 mg via ORAL
  Filled 2013-05-10 (×7): qty 1

## 2013-05-10 MED ORDER — ENSURE PUDDING PO PUDG
1.0000 | Freq: Three times a day (TID) | ORAL | Status: DC
  Administered 2013-05-10 – 2013-05-15 (×11): 1 via ORAL
  Filled 2013-05-10: qty 1

## 2013-05-10 NOTE — Progress Notes (Signed)
NUTRITION FOLLOW-UP  DOCUMENTATION CODES Per approved criteria  -Severe malnutrition in the context of acute illness or injury   INTERVENTION: Change Ensure Complete to Ensure Pudding. Continue Magic Cup TID with meals, each supplement provides 290 kcal and 9 grams of protein. If oral intake does not improve, and if within GOC, recommend initiation of enteral nutrition. RD to continue to follow nutrition care plan.  NUTRITION DIAGNOSIS: Inadequate oral intake related to poor appetite as evidenced by son's report. Ongoing.   Goal: Intake to meet >90% of estimated nutrition needs. Unmet.  Monitor:  weight trends, lab trends, I/O's, PO intake, supplement tolerance  ASSESSMENT: PMHx significant for GERD, DM, constipation. Recently admitted to OSH with sepsis 2/2 UTI and decompensated CHF, discharged 4 days ago to SNF. Admitted after being found unresponsive, labs revealed hypernatremia, hypokalemia, leukocytosis, and anemia. Work-up reveals HCAP.  BSE completed by SLP - recommendation includes Dysphagia 2 with Honey Thickened Liquids. Per SLP note, pt had a FEES recommending thickened liquids while at OSH, however pt consumed very little, and son gave pt water for comfort. Pt is not consuming meals.  Confirmed with RN that pt is not really eating anything. Will take Ensure Pudding instead of Ensure Complete - RD to change orders accordingly. Pt was noted to be less alert yesterday, RN hopeful that intake will improve with mental status.  Pt febrile on 2/12. Most recent temperature WNL.  Potassium now repleted and WNL.  Height: Ht Readings from Last 1 Encounters:  05/06/13 5\' 4"  (1.626 m)    Weight: Wt Readings from Last 1 Encounters:  05/09/13 179 lb 10.8 oz (81.5 kg)  Admit wt 172 lb; net fluid balance is +5 liters  BMI:  Body mass index is 30.83 kg/(m^2). Obese Class I  Estimated Nutritional Needs: Kcal: 1500 - 1700 Protein: 60 - 75 g Fluid: approx 1.8 - 2 liters  daily  Skin: abrasion to R leg  Diet Order: Dysphagia 2; Honey Thick  EDUCATION NEEDS: -No education needs identified at this time   Intake/Output Summary (Last 24 hours) at 05/10/13 1346 Last data filed at 05/10/13 1200  Gross per 24 hour  Intake   1600 ml  Output   1703 ml  Net   -103 ml    Last BM: 2/11  Labs:   Recent Labs Lab 05/06/13 0430 05/07/13 1030 05/08/13 0455 05/09/13 0445  NA 149* 150* 148*  --   K 3.3* 4.0 3.6* 4.7  CL 108 112 111  --   CO2 25 27 25   --   BUN 33* 26* 22  --   CREATININE 1.05 1.15 1.11  --   CALCIUM 7.9* 8.1* 8.0*  --   GLUCOSE 114* 153* 154*  --     CBG (last 3)   Recent Labs  05/09/13 2114 05/10/13 0747 05/10/13 1143  GLUCAP 133* 132* 139*    Scheduled Meds: . antiseptic oral rinse  15 mL Mouth Rinse BID  . aspirin EC  81 mg Oral Daily  . clotrimazole  1 application Topical BID  . enoxaparin (LOVENOX) injection  40 mg Subcutaneous Q24H  . feeding supplement (ENSURE COMPLETE)  237 mL Oral BID BM  . fluocinonide-emollient   Topical 2 times per day on Mon Tue Wed Thu  . fluticasone  1 spray Each Nare Daily  . insulin aspart  0-9 Units Subcutaneous TID WC  . lactobacillus acidophilus  1 tablet Oral TID  . micafungin [facility]) IV  100 mg Intravenous Daily  .  pantoprazole sodium  40 mg Oral Daily  . polyvinyl alcohol  2 drop Both Eyes TID  . sennosides-docusate sodium  2 tablet Oral BID  . sodium chloride  3 mL Intravenous Q12H  . tamsulosin  0.4 mg Oral QPC breakfast  . vitamin B-12  1,000 mcg Oral Daily    Continuous Infusions: . dextrose 50 mL/hr at 05/10/13 0839   Jarold MottoSamantha Alvena Kiernan MS, RD, LDN Pager: [phone] After-hours pager: [phone]

## 2013-05-10 NOTE — Progress Notes (Signed)
Physical Therapy Treatment Patient Details Name: Riley Martinez MRN: [id number] DOB: 08/21/1914 Today's Date: 05/10/2013 Time: [id number] PT Time Calculation (min): 33 min  PT Assessment / Plan / Recommendation  History of Present Illness [name] is a 80 y.o. male who was recently [facility] with sepsis secondary to enterococcus UTI, at that time patient also was found to have decompensated CHF and was eventually discharged to rehabilitation.  Today patient was found to be unresponsive.  Chest x-ray shows possible infiltrates and patient was found to be febrile.    PT Comments   Patient more alert and participating in LE therex prior to out of bed, able to roll better, but could not initiate lifting trunk from sidelying without significant help.  Assist to turn and take steps with walker, but fatigued quickly with postural support and needed assist twice to lift trunk before sitting due to anterior loss of balance.  Will need SNF for continued rehab following d/c.  Follow Up Recommendations  SNF     Does the patient have the potential to tolerate intense rehabilitation   N/A  Barriers to Discharge  None      Equipment Recommendations  None recommended by PT    Recommendations for Other Services  None  Frequency Min 2X/week   Progress towards PT Goals Progress towards PT goals: Progressing toward goals  Plan Current plan remains appropriate    Precautions / Restrictions Precautions Precautions: Fall   Pertinent Vitals/Pain No pain complaints    Mobility  Bed Mobility Overal bed mobility: Needs Assistance Bed Mobility: Rolling;Sidelying to Sit Rolling: Mod assist Sidelying to sit: Total assist General bed mobility comments: patient able to use rail to assist to roll, but even with increased time and multiple cues to push up to sit, pt did not initiate until I gave assist to right trunk after helping his feet off the bed. Transfers Overall  transfer level: Needs assistance Equipment used: Rolling walker (2 wheeled) Transfers: Sit to/from Stand Sit to Stand: Max assist General transfer comment: pulls up on walker, assist to get COG over BOS, cues for upright posture Ambulation/Gait Ambulation/Gait assistance: +2 physical assistance;Mod assist;Max assist Ambulation Distance (Feet): 5 Feet Assistive device: Rolling walker (2 wheeled) Gait Pattern/deviations: Step-to pattern;Shuffle;Narrow base of support;Trunk flexed General Gait Details: assist couple of times to right trunk due to heavy anterior lean, assist to move walker to turn and cues each time to step, able to move feet forward when walker pushed out, but then demonstrated anterior loss of balance    Exercises General Exercises - Lower Extremity Ankle Circles/Pumps: AROM;Both;10 reps;Supine Long Arc Quad: AROM;Both;10 reps;Seated Heel Slides: AAROM;5 reps;10 reps;Both;Supine     PT Goals (current goals can now be found in the care plan section)    Visit Information  Last PT Received On: 05/10/13 Assistance Needed: +2 History of Present Illness: [name] is a 80 y.o. male who was [facility] with sepsis secondary to enterococcus UTI, at that time patient also was found to have decompensated CHF and was eventually discharged to rehabilitation.  Today patient was found to be unresponsive.  Chest x-ray shows possible infiltrates and patient was found to be febrile.     Subjective Data      Cognition  Cognition Arousal/Alertness: Awake/alert Behavior During Therapy: WFL for tasks assessed/performed Overall Cognitive Status: History of cognitive impairments - at baseline    Balance  Balance Overall balance assessment: Needs assistance Sitting-balance support: Feet supported;No  upper extremity supported Sitting balance-Leahy Scale: Fair Sitting balance - Comments: sat edge of bed about 40 seconds with supervision only, still  needs supervision due to anterior bias Standing balance-Leahy Scale: Poor Standing balance comment: anterior bias  End of Session PT - End of Session Equipment Utilized During Treatment: Gait belt Activity Tolerance: Patient limited by fatigue Patient left: in chair;with call bell/phone within reach;with family/visitor present   GP     [facility] 05/10/2013, 11:33 AM Sheran Lawless, PT [phone] 05/10/2013

## 2013-05-10 NOTE — Progress Notes (Addendum)
[facility] PROGRESS NOTE  Riley Martinez WUJ:[id number] DOB: 03/07/1915 DOA: 05/05/2013 PCP: No primary provider on file.   Brief HPI:   Riley Martinez is a 80 y.o. male who was recently admitted at [facility] with sepsis secondary to enterococcus UTI and bacteremia, at that time patient also was found to have decompensated CHF and was eventually discharged to rehabilitation. patient was found to be unresponsive and was brought to the ER. Patient in the ER and CT head which was unremarkable. Labs revealed hypernatremia and hypokalemia with leukocytosis and anemia. Chest x-ray shows possible infiltrates and patient was found to be febrile. He was admitted by medical service and is managed for acute encephalopathy.     Assessment/Plan:   1. Acute encephalopathy secondary to health care associated pneumonia/enterococcal UTI bacteremia - possibly secondary to infective and metabolic reasons. Has finished his antibiotic course, chest x-ray only shows mild edema, cultures negative afebrile with no leukocytosis.     2. Hypernatremia and hypokalemia - probably from dehydration and poor intake and patient being on Lasix. Sodium improved after hydration, potassium replaced and monitored.     3. History of chronic systolic CHF with last EF measured 30-35% per patient's son - this patient was initially hypotensive and presently hypernatremic - mild acute on chronic systolic CHF after hydration, improving with gentle Lasix.     4. Anemia - baseline hemoglobin not known. Patients son states that patient has thalassemia. There was no mention of any GI bleed or black stools. Closely follow CBC. requested patient's charts from [facility]. Goal will be to keep hemoglobin above 7 he is asymptomatic.     5. History of diabetes mellitus type 2 on diet and sliding-scale coverage - closely follow CBGs with sliding-scale coverage.   CBG (last 3)   Recent Labs   05/09/13 1644 05/09/13 2114 05/10/13 0747  GLUCAP 152* 133* 132*     6. Recent admission at [facility] for enterococcus UTI sepsis and bacteremia - patient was on Unasyn and was to be continued until February 11. Patient has a PICC line.    7. Has chronic right lower extremity erythema.    8. Fungemia noted on blood cultures 05/08/2013, this is likely due to prolonged antibiotics which required PICC line placement, PICC line will be removed,     he has finished his antibiotics anyways, placed on micafungin. ID consulted, will need replacement of new PICC line once blood deemed clear of  Fungus.    9. Low back pain the last 1 week-  since he has low back pain we'll check MRI of the L-spine to rule out spinal seeding.     10. Urinary retention on 05/09/2013. Place on Flomax, Foley for now.    Code Status: DO NOT RESUSCITATE.   Family Communication: Patient's son on 2/11-12    Disposition Plan: snf when stable.     Consultants:  ID   Procedures:   PICC line at [facility]     Anti-infectives   Start     Dose/Rate Route Frequency Ordered Stop   05/09/13 1000  micafungin (MYCAMINE) 100 mg in sodium chloride 0.9 % 100 mL IVPB     100 mg 100 mL/hr over 1 Hours Intravenous Daily 05/09/13 0822     05/07/13 1600  Ampicillin-Sulbactam (UNASYN) 3 g in sodium chloride 0.9 % 100 mL IVPB     3 g 100 mL/hr over 60 Minutes Intravenous Every 6 hours 05/07/13 1539 05/08/13 2356   05/06/13  2000  ceFEPIme (MAXIPIME) 2 g in dextrose 5 % 50 mL IVPB  Status:  Discontinued     2 g 100 mL/hr over 30 Minutes Intravenous Every 24 hours 05/05/13 1946 05/05/13 2245   05/06/13 2000  vancomycin (VANCOCIN) IVPB 750 mg/150 ml premix  Status:  Discontinued     750 mg 150 mL/hr over 60 Minutes Intravenous Every 24 hours 05/05/13 2245 05/07/13 1532   05/06/13 0400  piperacillin-tazobactam (ZOSYN) IVPB 3.375 g  Status:  Discontinued     3.375 g 12.5 mL/hr over 240 Minutes  Intravenous 3 times per day 05/05/13 2245 05/07/13 1532   05/05/13 1908  vancomycin (VANCOCIN) 1,500 mg in sodium chloride 0.9 % 500 mL IVPB     1,500 mg 250 mL/hr over 120 Minutes Intravenous  Once 05/05/13 1908 05/05/13 2234   05/05/13 1815  ceFEPIme (MAXIPIME) 2 g in dextrose 5 % 50 mL IVPB     2 g 100 mL/hr over 30 Minutes Intravenous  Once 05/05/13 1801 05/05/13 2033      HPI/Subjective: pt remains confused. No new complaints.  Objective: Filed Vitals:   05/10/13 0525  BP: 126/61  Pulse: 71  Temp: 97.2 F (36.2 C)  Resp: 22    Intake/Output Summary (Last 24 hours) at 05/10/13 0959 Last data filed at 05/10/13 1610  Gross per 24 hour  Intake   1480 ml  Output   1701 ml  Net   -221 ml   Filed Weights   05/07/13 2215 05/08/13 2100 05/09/13 2120  Weight: 81.1 kg (178 lb 12.7 oz) 81.285 kg (179 lb 3.2 oz) 81.5 kg (179 lb 10.8 oz)    Exam: Alert and sitting on the bed, no distress.  Cardiovascular: S1-S2 heard.  Respiratory: no rhonchi or crepitations.  Abdomen: soft nontender bowel sounds present.  Skin: Skin rash on the right anterior shin which patient's son states is chronic.  Musculoskeletal: no edema   Data Reviewed: Basic Metabolic Panel:  Recent Labs Lab 05/05/13 1823 05/06/13 0430 05/07/13 1030 05/08/13 0455 05/09/13 0445  NA 150* 149* 150* 148*  --   K 3.1* 3.3* 4.0 3.6* 4.7  CL 109 108 112 111  --   CO2 24 25 27 25   --   GLUCOSE 132* 114* 153* 154*  --   BUN 36* 33* 26* 22  --   CREATININE 1.28 1.05 1.15 1.11  --   CALCIUM 7.9* 7.9* 8.1* 8.0*  --    Liver Function Tests:  Recent Labs Lab 05/05/13 1823 05/06/13 0430  AST 11 11  ALT 14 14  ALKPHOS 34* 32*  BILITOT 0.8 0.8  PROT 6.1 6.2  ALBUMIN 2.3* 2.3*   No results found for this basename: LIPASE, AMYLASE,  in the last 168 hours No results found for this basename: AMMONIA,  in the last 168 hours CBC:  Recent Labs Lab 05/05/13 1823 05/06/13 0430 05/08/13 0455  WBC 8.7 8.7 7.2   NEUTROABS 7.3 6.7  --   HGB 8.1* 8.5* 7.8*  HCT 25.7* 27.5* 25.4*  MCV 67.3* 67.4* 67.2*  PLT 252 244 248   Cardiac Enzymes: No results found for this basename: CKTOTAL, CKMB, CKMBINDEX, TROPONINI,  in the last 168 hours BNP (last 3 results) No results found for this basename: PROBNP,  in the last 8760 hours CBG:  Recent Labs Lab 05/09/13 0753 05/09/13 1159 05/09/13 1644 05/09/13 2114 05/10/13 0747  GLUCAP 147* 145* 152* 133* 132*    Recent Results (from the past 240 hour(s))  CULTURE, BLOOD (ROUTINE X 2)     Status: None   Collection Time    05/05/13  5:55 PM      Result Value Ref Range Status   Specimen Description BLOOD RIGHT ARM   Final   Special Requests BOTTLES DRAWN AEROBIC ONLY [facility]   Final   Culture  Setup Time     Final   Value: 05/05/2013 23:31     Performed at [facility]   Culture     Final   Value: YEAST     Note: Gram Stain Report Called to,Read Back By and Verified With: EMMANUEL CASTRO ON 05/08/2013 AT 9:25P BY [employer]     Performed at [facility]   Report Status PENDING   Incomplete  CULTURE, BLOOD (ROUTINE X 2)     Status: None   Collection Time    05/05/13  6:00 PM      Result Value Ref Range Status   Specimen Description BLOOD RIGHT HAND   Final   Special Requests BOTTLES DRAWN AEROBIC AND ANAEROBIC 5CC   Final   Culture  Setup Time     Final   Value: 05/05/2013 23:31     Performed at [facility]   Culture     Final   Value:        BLOOD CULTURE RECEIVED NO GROWTH TO DATE CULTURE WILL BE HELD FOR 5 DAYS BEFORE ISSUING A FINAL NEGATIVE REPORT     Performed at [facility]   Report Status PENDING   Incomplete  URINE CULTURE     Status: None   Collection Time    05/05/13  7:00 PM      Result Value Ref Range Status   Specimen Description URINE, CATHETERIZED   Final   Special Requests NONE   Final   Culture  Setup Time     Final   Value: 05/05/2013 23:32     Performed at [facility] Count      Final   Value: NO GROWTH     Performed at [facility]   Culture     Final   Value: NO GROWTH     Performed at [facility]   Report Status 05/06/2013 FINAL   Final  MRSA PCR SCREENING     Status: None   Collection Time    05/06/13  5:01 AM      Result Value Ref Range Status   MRSA by PCR NEGATIVE  NEGATIVE Final   Comment:            The GeneXpert MRSA Assay (FDA     approved for NASAL specimens     only), is one component of a     comprehensive MRSA colonization     surveillance program. It is not     intended to diagnose MRSA     infection nor to guide or     monitor treatment for     MRSA infections.  CULTURE, BLOOD (ROUTINE X 2)     Status: None   Collection Time    05/09/13  9:55 AM      Result Value Ref Range Status   Specimen Description BLOOD HAND RIGHT   Final   Special Requests BOTTLES DRAWN AEROBIC ONLY 10CC   Final   Culture  Setup Time     Final   Value: 05/09/2013 14:36     Performed at [facility]  Final   Value:        BLOOD CULTURE RECEIVED NO GROWTH TO DATE CULTURE WILL BE HELD FOR 5 DAYS BEFORE ISSUING A FINAL NEGATIVE REPORT     Performed at [facility]   Report Status PENDING   Incomplete  CULTURE, BLOOD (ROUTINE X 2)     Status: None   Collection Time    05/09/13 10:00 AM      Result Value Ref Range Status   Specimen Description BLOOD LEFT ANTECUBITAL   Final   Special Requests BOTTLES DRAWN AEROBIC ONLY 5CC   Final   Culture  Setup Time     Final   Value: 05/09/2013 14:36     Performed at [facility]   Culture     Final   Value:        BLOOD CULTURE RECEIVED NO GROWTH TO DATE CULTURE WILL BE HELD FOR 5 DAYS BEFORE ISSUING A FINAL NEGATIVE REPORT     Performed at [facility]   Report Status PENDING   Incomplete     Studies: Dg Chest 1 View  05/08/2013   CLINICAL DATA:  Shortness of breath.  EXAM: CHEST - 1 VIEW  COMPARISON:  Chest x-ray 05/07/2013.  FINDINGS: There is a right  upper extremity PICC with tip terminating in the right atrium. Skin fold artifact in the right hemithorax. Lung volumes are slightly low. No definite consolidative airspace disease. No pleural effusions. Diffuse peribronchial cuffing. Mild cephalization of the pulmonary vasculature with indistinct interstitial markings suggesting mild interstitial pulmonary edema. Mild cardiomegaly. The patient is rotated to the right on today's exam, resulting in distortion of the mediastinal contours and reduced diagnostic sensitivity and specificity for mediastinal pathology. Atherosclerosis in the thoracic aorta.  IMPRESSION: 1. The appearance the chest suggests mild congestive heart failure, as above. 2. Atherosclerosis. 3. Unchanged right upper extremity PICC.   Electronically Signed   By: Trudie Reed M.D.   On: 05/08/2013 11:59   Dg Thoracolumbar Spine  05/08/2013   CLINICAL DATA:  Pain all over.  EXAM: THORACOLUMBAR SPINE - 2 VIEW  COMPARISON:  No priors.  FINDINGS: Lateral view of the thoracolumbar spine incompletely visualizes the upper thoracic spine from T1 through T3 and the lower lumbar spine from L4 through S1. On the frontal projection, the thoracic spine is incompletely visualized from T1 through T6. With these limitations in mind, the visualized portions of the thorax the lumbar spine demonstrate no definite acute displaced fractures or compression type fractures. There is multilevel degenerative disc disease throughout the thoracolumbar spine. Multilevel facet arthropathy in the lumbar spine. Severe atherosclerosis is noted.  IMPRESSION: 1. Limited study demonstrating no definite acute radiographic abnormality of the visualized portions of the thoracolumbar spine, as discussed above.   Electronically Signed   By: Trudie Reed M.D.   On: 05/08/2013 11:55    Scheduled Meds: . antiseptic oral rinse  15 mL Mouth Rinse BID  . aspirin EC  81 mg Oral Daily  . clotrimazole  1 application Topical BID  .  enoxaparin (LOVENOX) injection  40 mg Subcutaneous Q24H  . feeding supplement (ENSURE COMPLETE)  237 mL Oral BID BM  . fluocinonide-emollient   Topical 2 times per day on Mon Tue Wed Thu  . fluticasone  1 spray Each Nare Daily  . insulin aspart  0-9 Units Subcutaneous TID WC  . lactobacillus acidophilus  1 tablet Oral TID  . micafungin [facility]) IV  100 mg Intravenous Daily  .  pantoprazole sodium  40 mg Oral Daily  . polyvinyl alcohol  2 drop Both Eyes TID  . sennosides-docusate sodium  2 tablet Oral BID  . sodium chloride  3 mL Intravenous Q12H  . tamsulosin  0.4 mg Oral QPC supper  . vitamin B-12  1,000 mcg Oral Daily   Continuous Infusions: . dextrose 50 mL/hr at 05/10/13 [id number]    Principal Problem:   Fever Active Problems:   Diabetes mellitus without complication   Bacteremia due to Enterococcus   Acute encephalopathy   Hypernatremia   Anemia   Fungemia   Acute low back pain    Time spent: 35 min    Vernal Hritz K  [facility] Pager [phone] If 7PM-7AM, please contact night-coverage at [email or web address], password [facility] 05/10/2013, 9:59 AM  LOS: 5 days

## 2013-05-10 NOTE — Clinical Social Work Note (Signed)
CS[facility]. Received call later from representative Deri FuellingMarie Smallwood to determine if patient ready for d/c today or over weekend and CSW checked and later advised Ms. Smallwood that patient not medically stable for d/c. CSW advised to submit updated clinicals on Monday or when medically stable [facility].  Genelle BalVanessa Dandy Lazaro, MSW, LCSW [phone]

## 2013-05-10 NOTE — Progress Notes (Signed)
Patient ID: Riley Martinez, male   DOB: Apr 13, 1914, 80 y.o.   MRN: [id number]         [facility]    Date of Admission:  05/05/2013            Day 2 micafungin  Principal Problem:   Fever Active Problems:   Fungemia   Bacteremia due to Enterococcus   Acute low back pain   Diabetes mellitus without complication   Acute encephalopathy   Hypernatremia   Anemia   . antiseptic oral rinse  15 mL Mouth Rinse BID  . aspirin EC  81 mg Oral Daily  . clotrimazole  1 application Topical BID  . enoxaparin (LOVENOX) injection  40 mg Subcutaneous Q24H  . feeding supplement (ENSURE)  1 Container Oral TID BM  . fluocinonide-emollient   Topical 2 times per day on Mon Tue Wed Thu  . fluticasone  1 spray Each Nare Daily  . insulin aspart  0-9 Units Subcutaneous TID WC  . lactobacillus acidophilus  1 tablet Oral TID  . micafungin [facility]) IV  100 mg Intravenous Daily  . pantoprazole sodium  40 mg Oral Daily  . polyvinyl alcohol  2 drop Both Eyes TID  . sennosides-docusate sodium  2 tablet Oral BID  . sodium chloride  3 mL Intravenous Q12H  . tamsulosin  0.4 mg Oral QPC breakfast  . vitamin B-12  1,000 mcg Oral Daily    Subjective: He denies back pain today.  Past Medical History  Diagnosis Date  . GERD (gastroesophageal reflux disease)   . Hypertension   . BPH (benign prostatic hyperplasia)   . Diabetes mellitus without complication   . Constipation     History  Substance Use Topics  . Smoking status: Former Games developer  . Smokeless tobacco: Not on file  . Alcohol Use: Yes     Comment: occasionally has not had for years.    Family History  Problem Relation Age of Onset  . Diabetes Mellitus II Mother     Allergies  Allergen Reactions  . Ativan [Lorazepam] Other (See Comments)    unknown    Objective: Temp:  [97.2 F (36.2 C)-100.1 F (37.8 C)] 98.3 F (36.8 C) (02/13 1401) Pulse Rate:  [63-99] 63 (02/13 1401) Resp:  [18-23] 20 (02/13  1401) BP: (112-159)/(59-78) 116/59 mmHg (02/13 1401) SpO2:  [93 %-99 %] 99 % (02/13 1027) Weight:  [81.5 kg (179 lb 10.8 oz)] 81.5 kg (179 lb 10.8 oz) (02/12 2120)  General: He is asleep in bed but arouses easily to voice and answers simple questions appropriately. Skin: His PICC has been removed Lungs: Clear Cor: Regular S1 and S2 no murmurs Abdomen: Soft nontender   Lab Results Lab Results  Component Value Date   WBC 7.2 05/08/2013   HGB 7.8* 05/08/2013   HCT 25.4* 05/08/2013   MCV 67.2* 05/08/2013   PLT 248 05/08/2013    Lab Results  Component Value Date   CREATININE 1.11 05/08/2013   BUN 22 05/08/2013   NA 148* 05/08/2013   K 4.7 05/09/2013   CL 111 05/08/2013   CO2 25 05/08/2013    Lab Results  Component Value Date   ALT 14 05/06/2013   AST 11 05/06/2013   ALKPHOS 32* 05/06/2013   BILITOT 0.8 05/06/2013      Microbiology: Recent Results (from the past 240 hour(s))  CULTURE, BLOOD (ROUTINE X 2)     Status: None   Collection Time    05/05/13  5:55 PM      Result Value Ref Range Status   Specimen Description BLOOD RIGHT ARM   Final   Special Requests BOTTLES DRAWN AEROBIC ONLY [facility]   Final   Culture  Setup Time     Final   Value: 05/05/2013 23:31     [facility]   Culture     Final   Value: YEAST     Note: Gram Stain Report Called to,Read Back By and Verified With: EMMANUEL CASTRO ON 05/08/2013 AT 9:25P BY [email or web address]     [facility]   Report Status PENDING   Incomplete  CULTURE, BLOOD (ROUTINE X 2)     Status: None   Collection Time    05/05/13  6:00 PM      Result Value Ref Range Status   Specimen Description BLOOD RIGHT HAND   Final   Special Requests BOTTLES DRAWN AEROBIC AND ANAEROBIC 5CC   Final   Culture  Setup Time     Final   Value: 05/05/2013 23:31     [facility]   Culture     Final   Value:        BLOOD CULTURE RECEIVED NO GROWTH TO DATE CULTURE WILL BE HELD FOR 5 DAYS BEFORE ISSUING A FINAL NEGATIVE  REPORT     [facility]   Report Status PENDING   Incomplete  URINE CULTURE     Status: None   Collection Time    05/05/13  7:00 PM      Result Value Ref Range Status   Specimen Description URINE, CATHETERIZED   Final   Special Requests NONE   Final   Culture  Setup Time     Final   Value: 05/05/2013 23:32     [facility]   Colony Count     Final   Value: NO GROWTH     [facility]   Culture     Final   Value: NO GROWTH     [facility] at [facility]   Report Status 05/06/2013 FINAL   Final  MRSA PCR SCREENING     Status: None   Collection Time    05/06/13  5:01 AM      Result Value Ref Range Status   MRSA by PCR NEGATIVE  NEGATIVE Final   Comment:            The GeneXpert MRSA Assay (FDA     approved for NASAL specimens     only), is one component of a     comprehensive MRSA colonization     surveillance program. It is not     intended to diagnose MRSA     infection nor to guide or     monitor treatment for     MRSA infections.  CULTURE, BLOOD (ROUTINE X 2)     Status: None   Collection Time    05/09/13  9:55 AM      Result Value Ref Range Status   Specimen Description BLOOD HAND RIGHT   Final   Special Requests BOTTLES DRAWN AEROBIC ONLY 10CC   Final   Culture  Setup Time     Final   Value: 05/09/2013 14:36     [facility]   Culture     Final   Value:        BLOOD CULTURE RECEIVED NO GROWTH  TO DATE CULTURE WILL BE HELD FOR 5 DAYS BEFORE ISSUING A FINAL NEGATIVE REPORT     Performed at [facility]   Report Status PENDING   Incomplete  CULTURE, BLOOD (ROUTINE X 2)     Status: None   Collection Time    05/09/13 10:00 AM      Result Value Ref Range Status   Specimen Description BLOOD LEFT ANTECUBITAL   Final   Special Requests BOTTLES DRAWN AEROBIC ONLY 5CC   Final   Culture  Setup Time     Final   Value: 05/09/2013 14:36     Performed at [facility]    Culture     Final   Value:        BLOOD CULTURE RECEIVED NO GROWTH TO DATE CULTURE WILL BE HELD FOR 5 DAYS BEFORE ISSUING A FINAL NEGATIVE REPORT     Performed at [facility]   Report Status PENDING   Incomplete   Assessment: He is improving with PICC removal and therapy for transient fungemia. Repeat blood cultures are negative so far at 24 hours. I will continue micafungin for now. I would prefer to wait on placing a new patient until I know that repeat blood cultures are negative. He will need at least 14 days of appropriate antifungal therapy following first negative blood culture.  Plan: 1. Continue micafungin 2. Await results of repeat blood cultures  Cliffton Asters, MD [facility] [phone] pager   [phone] cell 05/10/2013, 4:29 PM

## 2013-05-11 ENCOUNTER — Inpatient Hospital Stay (HOSPITAL_COMMUNITY): Payer: Medicare Other

## 2013-05-11 LAB — BASIC METABOLIC PANEL
BUN: 12 mg/dL (ref 6–23)
CHLORIDE: 105 meq/L (ref 96–112)
CO2: 27 mEq/L (ref 19–32)
Calcium: 8.3 mg/dL — ABNORMAL LOW (ref 8.4–10.5)
Creatinine, Ser: 0.87 mg/dL (ref 0.50–1.35)
GFR calc Af Amer: 82 mL/min — ABNORMAL LOW (ref >90)
GFR, EST NON AFRICAN AMERICAN: 71 mL/min — AB (ref >90)
Glucose, Bld: 124 mg/dL — ABNORMAL HIGH (ref 70–99)
POTASSIUM: 3.5 meq/L — AB (ref 3.7–5.3)
SODIUM: 144 meq/L (ref 137–147)

## 2013-05-11 LAB — CULTURE, BLOOD (ROUTINE X 2): CULTURE: NO GROWTH

## 2013-05-11 LAB — CBC
HEMATOCRIT: 25.8 % — AB (ref 39.0–52.0)
Hemoglobin: 8 g/dL — ABNORMAL LOW (ref 13.0–17.0)
MCH: 20.6 pg — ABNORMAL LOW (ref 26.0–34.0)
MCHC: 31 g/dL (ref 30.0–36.0)
MCV: 66.3 fL — ABNORMAL LOW (ref 78.0–100.0)
PLATELETS: 217 10*3/uL (ref 150–400)
RBC: 3.89 MIL/uL — ABNORMAL LOW (ref 4.22–5.81)
RDW: 15.1 % (ref 11.5–15.5)
WBC: 6.4 10*3/uL (ref 4.0–10.5)

## 2013-05-11 LAB — GLUCOSE, CAPILLARY
GLUCOSE-CAPILLARY: 120 mg/dL — AB (ref 70–99)
GLUCOSE-CAPILLARY: 137 mg/dL — AB (ref 70–99)
Glucose-Capillary: 123 mg/dL — ABNORMAL HIGH (ref 70–99)
Glucose-Capillary: 112 mg/dL — ABNORMAL HIGH (ref 70–99)

## 2013-05-11 MED ORDER — POTASSIUM CHLORIDE CRYS ER 20 MEQ PO TBCR
40.0000 meq | EXTENDED_RELEASE_TABLET | Freq: Once | ORAL | Status: AC
  Administered 2013-05-11: 40 meq via ORAL
  Filled 2013-05-11: qty 2

## 2013-05-11 MED ORDER — GADOBENATE DIMEGLUMINE 529 MG/ML IV SOLN
15.0000 mL | Freq: Once | INTRAVENOUS | Status: AC | PRN
  Administered 2013-05-11: 15 mL via INTRAVENOUS

## 2013-05-11 MED ORDER — FENTANYL CITRATE 0.05 MG/ML IJ SOLN
50.0000 ug | Freq: Once | INTRAMUSCULAR | Status: AC
  Administered 2013-05-11: 50 ug via INTRAVENOUS
  Filled 2013-05-11: qty 2

## 2013-05-11 NOTE — Progress Notes (Signed)
Patient ID: Riley Martinez, male   DOB: 1915-03-25, 80 y.o.   MRN: [id number]         [facility]    Date of Admission:  05/05/2013            Day 3 micafungin  Principal Problem:   Fever Active Problems:   Fungemia   Bacteremia due to Enterococcus   Acute low back pain   Diabetes mellitus without complication   Acute encephalopathy   Hypernatremia   Anemia   . antiseptic oral rinse  15 mL Mouth Rinse BID  . aspirin EC  81 mg Oral Daily  . clotrimazole  1 application Topical BID  . enoxaparin (LOVENOX) injection  40 mg Subcutaneous Q24H  . feeding supplement (ENSURE)  1 Container Oral TID BM  . fluocinonide-emollient   Topical 2 times per day on Mon Tue Wed Thu  . fluticasone  1 spray Each Nare Daily  . insulin aspart  0-9 Units Subcutaneous TID WC  . lactobacillus acidophilus  1 tablet Oral TID  . micafungin [facility](MYCAMINE) IV  100 mg Intravenous Daily  . pantoprazole sodium  40 mg Oral Daily  . polyvinyl alcohol  2 drop Both Eyes TID  . sennosides-docusate sodium  2 tablet Oral BID  . sodium chloride  3 mL Intravenous Q12H  . tamsulosin  0.4 mg Oral QPC breakfast  . vitamin B-12  1,000 mcg Oral Daily    Subjective: He denies back pain today.  Past Medical History  Diagnosis Date  . GERD (gastroesophageal reflux disease)   . Hypertension   . BPH (benign prostatic hyperplasia)   . Diabetes mellitus without complication   . Constipation     History  Substance Use Topics  . Smoking status: Former Games developermoker  . Smokeless tobacco: Not on file  . Alcohol Use: Yes     Comment: occasionally has not had for years.    Family History  Problem Relation Age of Onset  . Diabetes Mellitus II Mother     Allergies  Allergen Reactions  . Ativan [Lorazepam] Other (See Comments)    unknown    Objective: Temp:  [97.8 F (36.6 C)-98.8 F (37.1 C)] 97.8 F (36.6 C) (02/14 1338) Pulse Rate:  [63-77] 70 (02/14 1338) Resp:  [16-24] 22 (02/14  1338) BP: (136-155)/(55-76) 155/76 mmHg (02/14 1338) SpO2:  [93 %-97 %] 97 % (02/14 1338) Weight:  [78.744 kg (173 lb 9.6 oz)] 78.744 kg (173 lb 9.6 oz) (02/13 2100)  General: He is more alert and answers simple questions appropriately. Skin: His PICC has been removed Lungs: Clear Cor: Regular S1 and S2 no murmurs Abdomen: Soft nontender   Lab Results Lab Results  Component Value Date   WBC 6.4 05/11/2013   HGB 8.0* 05/11/2013   HCT 25.8* 05/11/2013   MCV 66.3* 05/11/2013   PLT 217 05/11/2013    Lab Results  Component Value Date   CREATININE 0.87 05/11/2013   BUN 12 05/11/2013   NA 144 05/11/2013   K 3.5* 05/11/2013   CL 105 05/11/2013   CO2 27 05/11/2013    Lab Results  Component Value Date   ALT 14 05/06/2013   AST 11 05/06/2013   ALKPHOS 32* 05/06/2013   BILITOT 0.8 05/06/2013      Microbiology: Recent Results (from the past 240 hour(s))  CULTURE, BLOOD (ROUTINE X 2)     Status: None   Collection Time    05/05/13  5:55 PM  Result Value Ref Range Status   Specimen Description BLOOD RIGHT ARM   Final   Special Requests BOTTLES DRAWN AEROBIC ONLY [facility]   Final   Culture  Setup Time     Final   Value: 05/05/2013 23:31     Performed at [facility]   Culture     Final   Value: YEAST     Note: Gram Stain Report Called to,Read Back By and Verified With: EMMANUEL CASTRO ON 05/08/2013 AT 9:25P BY [employer]     Performed at [facility]   Report Status PENDING   Incomplete  CULTURE, BLOOD (ROUTINE X 2)     Status: None   Collection Time    05/05/13  6:00 PM      Result Value Ref Range Status   Specimen Description BLOOD RIGHT HAND   Final   Special Requests BOTTLES DRAWN AEROBIC AND ANAEROBIC 5CC   Final   Culture  Setup Time     Final   Value: 05/05/2013 23:31     Performed at [facility]   Culture     Final   Value:        BLOOD CULTURE RECEIVED NO GROWTH TO DATE CULTURE WILL BE HELD FOR 5 DAYS BEFORE ISSUING A FINAL NEGATIVE REPORT     Performed at  [facility]   Report Status PENDING   Incomplete  URINE CULTURE     Status: None   Collection Time    05/05/13  7:00 PM      Result Value Ref Range Status   Specimen Description URINE, CATHETERIZED   Final   Special Requests NONE   Final   Culture  Setup Time     Final   Value: 05/05/2013 23:32     Performed at [facility] Count     Final   Value: NO GROWTH     Performed at [facility]   Culture     Final   Value: NO GROWTH     Performed at [facility]   Report Status 05/06/2013 FINAL   Final  MRSA PCR SCREENING     Status: None   Collection Time    05/06/13  5:01 AM      Result Value Ref Range Status   MRSA by PCR NEGATIVE  NEGATIVE Final   Comment:            The GeneXpert MRSA Assay (FDA     approved for NASAL specimens     only), is one component of a     comprehensive MRSA colonization     surveillance program. It is not     intended to diagnose MRSA     infection nor to guide or     monitor treatment for     MRSA infections.  CULTURE, BLOOD (ROUTINE X 2)     Status: None   Collection Time    05/09/13  9:55 AM      Result Value Ref Range Status   Specimen Description BLOOD HAND RIGHT   Final   Special Requests BOTTLES DRAWN AEROBIC ONLY 10CC   Final   Culture  Setup Time     Final   Value: 05/09/2013 14:36     Performed at [facility]   Culture     Final   Value:        BLOOD CULTURE RECEIVED NO GROWTH TO DATE CULTURE WILL BE HELD FOR  5 DAYS BEFORE ISSUING A FINAL NEGATIVE REPORT     Performed at [facility]   Report Status PENDING   Incomplete  CULTURE, BLOOD (ROUTINE X 2)     Status: None   Collection Time    05/09/13 10:00 AM      Result Value Ref Range Status   Specimen Description BLOOD LEFT ANTECUBITAL   Final   Special Requests BOTTLES DRAWN AEROBIC ONLY 5CC   Final   Culture  Setup Time     Final   Value: 05/09/2013 14:36     Performed at [facility]   Culture     Final    Value:        BLOOD CULTURE RECEIVED NO GROWTH TO DATE CULTURE WILL BE HELD FOR 5 DAYS BEFORE ISSUING A FINAL NEGATIVE REPORT     Performed at [facility]   Report Status PENDING   Incomplete   MRI LUMBAR SPINE WITHOUT AND WITH CONTRAST 05/10/2013   IMPRESSION:  Severely motion degraded exam.  L4-5 disc hyperintensity and enhancement without associated  vertebral body abnormality or epidural fluid collection. Early  discitis is not excluded.  Multilevel spondylosis with severe spinal stenosis at L3-4 and L4-5.  Question bladder distention and bilateral hydronephrosis. Large  right renal cyst. Consider renal ultrasound and/or CT abdomen pelvis  for further evaluation.   By: Davonna Belling M.D.  On: 05/11/2013 12:29   Assessment: He is improving with PICC removal and therapy for transient fungemia. Repeat blood cultures are negative so far. I will continue micafungin for now. I would prefer to wait on placing a new PICC until I know that repeat blood cultures are negative. If his initial positive blood cultures found to have Candida albicans he could be transitioned to high-dose oral fluconazole. If his organism is not speciate it at the time of discharge or shows a more resistant strain of Candida he will need to stay on IV micafungin. He will need at least 14 days of appropriate antifungal therapy following first negative blood culture.  Although his MRI raises concern for possible lumbar discitis, he denies having any back pain now which would make that diagnosis much less likely. I would not add any further antimicrobial therapy or pursue lumbar aspirate as long as he is not having significant back pain.  Plan: 1. Continue micafungin 2. Await results of repeat blood cultures 3. I will follow up on Monday, February 16  Cliffton Asters, MD [facility] [phone] pager   [phone] cell 05/11/2013, 2:13 PM

## 2013-05-11 NOTE — Progress Notes (Signed)
[facility] PROGRESS NOTE  Riley Martinez ZOX:[id number] DOB: May 22, 1914 DOA: 05/05/2013 PCP: No primary provider on file.   Brief HPI:   Riley Martinez is a 80 y.o. male who was recently admitted [facility] with sepsis secondary to enterococcus UTI and bacteremia, at that time patient also was found to have decompensated CHF and was eventually discharged to rehabilitation. patient was found to be unresponsive and was brought to the ER. Patient in the ER and CT head which was unremarkable. Labs revealed hypernatremia and hypokalemia with leukocytosis and anemia. Chest x-ray shows possible infiltrates and patient was found to be febrile. He was admitted by medical service and is managed for acute encephalopathy.     Assessment/Plan:   1. Acute encephalopathy secondary to health care associated pneumonia/enterococcal UTI bacteremia - possibly secondary to infective and metabolic reasons. Has finished his antibiotic course, chest x-ray only shows mild edema, cultures negative afebrile with no leukocytosis.     2. Hypernatremia and hypokalemia - probably from dehydration and poor intake and patient being on Lasix. Sodium improved after hydration, potassium replaced and monitored.     3. History of chronic systolic CHF with last EF measured 30-35% per patient's son - this patient was initially hypotensive and presently hypernatremic - mild acute on chronic systolic CHF after hydration, improving with gentle Lasix.     4. Anemia - baseline hemoglobin not known. Patients son states that patient has thalassemia. There was no mention of any GI bleed or black stools. Closely follow CBC. [facility]. Goal will be to keep hemoglobin above 7 he is asymptomatic.     5. History of diabetes mellitus type 2 on diet and sliding-scale coverage - closely follow CBGs with sliding-scale coverage.   CBG (last 3)   Recent Labs   05/10/13 1650 05/10/13 2059 05/11/13 0745  GLUCAP 131* 134* 120*     6. Recent admission [facility] for enterococcus UTI sepsis and bacteremia - patient was on Unasyn and was to be continued until February 11. Patient has a PICC line.     7. Has chronic right lower extremity erythema.     8. Fungemia noted on blood cultures 05/08/2013, this is likely due to prolonged antibiotics which required PICC line placement, PICC line will be removed,     he has finished his antibiotics anyways, placed on micafungin. ID consulted, will need replacement of new PICC line once blood deemed clear of  Fungus.     9. Low back pain the last 1 week-  since he has low back pain we'll check MRI of the L-spine to rule out spinal seeding.     10. Urinary retention on 05/09/2013. Place on Flomax, Foley for now.      Code Status: DO NOT RESUSCITATE.   Family Communication: Patient's sons on daily since the 11,12,13,14  Disposition Plan: snf when stable.     Consultants:  ID   Procedures:   PICC line at [facility]     Anti-infectives   Start     Dose/Rate Route Frequency Ordered Stop   05/09/13 1000  micafungin (MYCAMINE) 100 mg in sodium chloride 0.9 % 100 mL IVPB     100 mg 100 mL/hr over 1 Hours Intravenous Daily 05/09/13 0822     05/07/13 1600  Ampicillin-Sulbactam (UNASYN) 3 g in sodium chloride 0.9 % 100 mL IVPB     3 g 100 mL/hr over 60 Minutes Intravenous Every 6 hours 05/07/13  1539 05/08/13 2356   05/06/13 2000  ceFEPIme (MAXIPIME) 2 g in dextrose 5 % 50 mL IVPB  Status:  Discontinued     2 g 100 mL/hr over 30 Minutes Intravenous Every 24 hours 05/05/13 1946 05/05/13 2245   05/06/13 2000  vancomycin (VANCOCIN) IVPB 750 mg/150 ml premix  Status:  Discontinued     750 mg 150 mL/hr over 60 Minutes Intravenous Every 24 hours 05/05/13 2245 05/07/13 1532   05/06/13 0400  piperacillin-tazobactam (ZOSYN) IVPB 3.375 g  Status:  Discontinued     3.375 g 12.5  mL/hr over 240 Minutes Intravenous 3 times per day 05/05/13 2245 05/07/13 1532   05/05/13 1908  vancomycin (VANCOCIN) 1,500 mg in sodium chloride 0.9 % 500 mL IVPB     1,500 mg 250 mL/hr over 120 Minutes Intravenous  Once 05/05/13 1908 05/05/13 2234   05/05/13 1815  ceFEPIme (MAXIPIME) 2 g in dextrose 5 % 50 mL IVPB     2 g 100 mL/hr over 30 Minutes Intravenous  Once 05/05/13 1801 05/05/13 2033      HPI/Subjective: pt remains confused. No new complaints.  Objective: Filed Vitals:   05/11/13 0557  BP: 136/55  Pulse: 63  Temp: 97.8 F (36.6 C)  Resp: 24    Intake/Output Summary (Last 24 hours) at 05/11/13 0804 Last data filed at 05/11/13 0601  Gross per 24 hour  Intake    360 ml  Output   1179 ml  Net   -819 ml   Filed Weights   05/08/13 2100 05/09/13 2120 05/10/13 2100  Weight: 81.285 kg (179 lb 3.2 oz) 81.5 kg (179 lb 10.8 oz) 78.744 kg (173 lb 9.6 oz)    Exam: Alert and sitting on the bed, no distress.  Cardiovascular: S1-S2 heard.  Respiratory: no rhonchi or crepitations.  Abdomen: soft nontender bowel sounds present.  Skin: Skin rash on the right anterior shin which patient's son states is chronic.  Musculoskeletal: no edema   Data Reviewed: Basic Metabolic Panel:  Recent Labs Lab 05/05/13 1823 05/06/13 0430 05/07/13 1030 05/08/13 0455 05/09/13 0445 05/11/13 0415  NA 150* 149* 150* 148*  --  144  K 3.1* 3.3* 4.0 3.6* 4.7 3.5*  CL 109 108 112 111  --  105  CO2 24 25 27 25   --  27  GLUCOSE 132* 114* 153* 154*  --  124*  BUN 36* 33* 26* 22  --  12  CREATININE 1.28 1.05 1.15 1.11  --  0.87  CALCIUM 7.9* 7.9* 8.1* 8.0*  --  8.3*   Liver Function Tests:  Recent Labs Lab 05/05/13 1823 05/06/13 0430  AST 11 11  ALT 14 14  ALKPHOS 34* 32*  BILITOT 0.8 0.8  PROT 6.1 6.2  ALBUMIN 2.3* 2.3*   No results found for this basename: LIPASE, AMYLASE,  in the last 168 hours No results found for this basename: AMMONIA,  in the last 168 hours CBC:  Recent  Labs Lab 05/05/13 1823 05/06/13 0430 05/08/13 0455 05/11/13 0415  WBC 8.7 8.7 7.2 6.4  NEUTROABS 7.3 6.7  --   --   HGB 8.1* 8.5* 7.8* 8.0*  HCT 25.7* 27.5* 25.4* 25.8*  MCV 67.3* 67.4* 67.2* 66.3*  PLT 252 244 248 217   Cardiac Enzymes: No results found for this basename: CKTOTAL, CKMB, CKMBINDEX, TROPONINI,  in the last 168 hours BNP (last 3 results) No results found for this basename: PROBNP,  in the last 8760 hours CBG:  Recent Labs Lab  05/10/13 0747 05/10/13 1143 05/10/13 1650 05/10/13 2059 05/11/13 0745  GLUCAP 132* 139* 131* 134* 120*    Recent Results (from the past 240 hour(s))  CULTURE, BLOOD (ROUTINE X 2)     Status: None   Collection Time    05/05/13  5:55 PM      Result Value Ref Range Status   Specimen Description BLOOD RIGHT ARM   Final   Special Requests BOTTLES DRAWN AEROBIC ONLY [facility]   Final   Culture  Setup Time     Final   Value: 05/05/2013 23:31     Performed at [facility]   Culture     Final   Value: YEAST     Note: Gram Stain Report Called to,Read Back By and Verified With: EMMANUEL CASTRO ON 05/08/2013 AT 9:25P BY [employer]     Performed at [facility]   Report Status PENDING   Incomplete  CULTURE, BLOOD (ROUTINE X 2)     Status: None   Collection Time    05/05/13  6:00 PM      Result Value Ref Range Status   Specimen Description BLOOD RIGHT HAND   Final   Special Requests BOTTLES DRAWN AEROBIC AND ANAEROBIC 5CC   Final   Culture  Setup Time     Final   Value: 05/05/2013 23:31     Performed at [facility]   Culture     Final   Value:        BLOOD CULTURE RECEIVED NO GROWTH TO DATE CULTURE WILL BE HELD FOR 5 DAYS BEFORE ISSUING A FINAL NEGATIVE REPORT     Performed at [facility]   Report Status PENDING   Incomplete  URINE CULTURE     Status: None   Collection Time    05/05/13  7:00 PM      Result Value Ref Range Status   Specimen Description URINE, CATHETERIZED   Final   Special Requests NONE   Final    Culture  Setup Time     Final   Value: 05/05/2013 23:32     Performed at [facility] Count     Final   Value: NO GROWTH     Performed at [facility]   Culture     Final   Value: NO GROWTH     Performed at [facility]   Report Status 05/06/2013 FINAL   Final  MRSA PCR SCREENING     Status: None   Collection Time    05/06/13  5:01 AM      Result Value Ref Range Status   MRSA by PCR NEGATIVE  NEGATIVE Final   Comment:            The GeneXpert MRSA Assay (FDA     approved for NASAL specimens     only), is one component of a     comprehensive MRSA colonization     surveillance program. It is not     intended to diagnose MRSA     infection nor to guide or     monitor treatment for     MRSA infections.  CULTURE, BLOOD (ROUTINE X 2)     Status: None   Collection Time    05/09/13  9:55 AM      Result Value Ref Range Status   Specimen Description BLOOD HAND RIGHT   Final   Special Requests BOTTLES DRAWN AEROBIC ONLY 10CC   Final  Culture  Setup Time     Final   Value: 05/09/2013 14:36     Performed at [facility]   Culture     Final   Value:        BLOOD CULTURE RECEIVED NO GROWTH TO DATE CULTURE WILL BE HELD FOR 5 DAYS BEFORE ISSUING A FINAL NEGATIVE REPORT     Performed at [facility]   Report Status PENDING   Incomplete  CULTURE, BLOOD (ROUTINE X 2)     Status: None   Collection Time    05/09/13 10:00 AM      Result Value Ref Range Status   Specimen Description BLOOD LEFT ANTECUBITAL   Final   Special Requests BOTTLES DRAWN AEROBIC ONLY 5CC   Final   Culture  Setup Time     Final   Value: 05/09/2013 14:36     Performed at [facility]   Culture     Final   Value:        BLOOD CULTURE RECEIVED NO GROWTH TO DATE CULTURE WILL BE HELD FOR 5 DAYS BEFORE ISSUING A FINAL NEGATIVE REPORT     Performed at [facility]   Report Status PENDING   Incomplete     Studies: No results found.  Scheduled Meds: .  antiseptic oral rinse  15 mL Mouth Rinse BID  . aspirin EC  81 mg Oral Daily  . clotrimazole  1 application Topical BID  . enoxaparin (LOVENOX) injection  40 mg Subcutaneous Q24H  . feeding supplement (ENSURE)  1 Container Oral TID BM  . fluocinonide-emollient   Topical 2 times per day on Mon Tue Wed Thu  . fluticasone  1 spray Each Nare Daily  . insulin aspart  0-9 Units Subcutaneous TID WC  . lactobacillus acidophilus  1 tablet Oral TID  . micafungin [facility]) IV  100 mg Intravenous Daily  . pantoprazole sodium  40 mg Oral Daily  . polyvinyl alcohol  2 drop Both Eyes TID  . potassium chloride  40 mEq Oral Once  . sennosides-docusate sodium  2 tablet Oral BID  . sodium chloride  3 mL Intravenous Q12H  . tamsulosin  0.4 mg Oral QPC breakfast  . vitamin B-12  1,000 mcg Oral Daily   Continuous Infusions: . dextrose 50 mL/hr at 05/10/13 1610    Principal Problem:   Fever Active Problems:   Diabetes mellitus without complication   Bacteremia due to Enterococcus   Acute encephalopathy   Hypernatremia   Anemia   Fungemia   Acute low back pain    Time spent: 35 min    SINGH,PRASHANT K  [facility] Pager [phone] If 7PM-7AM, please contact night-coverage at [email or web address], password [facility] 05/11/2013, 8:04 AM  LOS: 6 days

## 2013-05-12 LAB — CULTURE, BLOOD (ROUTINE X 2)

## 2013-05-12 LAB — CREATININE, SERUM
CREATININE: 0.73 mg/dL (ref 0.50–1.35)
GFR calc Af Amer: 88 mL/min — ABNORMAL LOW (ref >90)
GFR calc non Af Amer: 76 mL/min — ABNORMAL LOW (ref >90)

## 2013-05-12 LAB — GLUCOSE, CAPILLARY
GLUCOSE-CAPILLARY: 112 mg/dL — AB (ref 70–99)
GLUCOSE-CAPILLARY: 111 mg/dL — AB (ref 70–99)
Glucose-Capillary: 108 mg/dL — ABNORMAL HIGH (ref 70–99)
Glucose-Capillary: 135 mg/dL — ABNORMAL HIGH (ref 70–99)

## 2013-05-12 NOTE — Progress Notes (Signed)
[facility] PROGRESS NOTE  Riley Martinez WUJ:[id number] DOB: 1914/09/13 DOA: 05/05/2013 PCP: No primary provider on file.   Brief HPI:   Riley Martinez is a 80 y.o. male who was recently admitted at [facility] with sepsis secondary to enterococcus UTI and bacteremia, at that time patient also was found to have decompensated CHF and was eventually discharged to rehabilitation. patient was found to be unresponsive and was brought to the ER. Patient in the ER and CT head which was unremarkable. Labs revealed hypernatremia and hypokalemia with leukocytosis and anemia. Chest x-ray shows possible infiltrates and patient was found to be febrile. He was admitted by medical service and is managed for acute encephalopathy.    He has finished his antibiotics for pneumonia and enterococcal UTI with enterococcal bacteremia, he now has fungemia likely from long-standing PICC line which has been removed. He had back pain which has now resolved. Has been seen by ID.    Assessment/Plan:   1. Acute encephalopathy secondary to health care associated pneumonia/enterococcal UTI bacteremia - possibly secondary to infective and metabolic reasons. Has finished his antibiotic course, chest x-ray only shows mild edema, cultures negative afebrile with no leukocytosis.     2. Hypernatremia and hypokalemia - probably from dehydration and poor intake and patient being on Lasix. Sodium improved after hydration, potassium replaced and monitored.     3. History of chronic systolic CHF with last EF measured 30-35% per patient's son - this patient was initially hypotensive and presently hypernatremic - mild acute on chronic systolic CHF after hydration, improving with gentle Lasix.     4. Anemia - baseline hemoglobin not known. Patients son states that patient has thalassemia. There was no mention of any GI bleed or black stools. Closely follow CBC. requested patient's charts from [facility]. Goal will be to keep hemoglobin above 7 he is asymptomatic.     5. History of diabetes mellitus type 2 on diet and sliding-scale coverage - closely follow CBGs with sliding-scale coverage.   CBG (last 3)   Recent Labs  05/11/13 1710 05/11/13 2228 05/12/13 0801  GLUCAP 123* 112* 108*     6. Recent admission at [facility] for enterococcus UTI sepsis and bacteremia - patient was on Unasyn and was to be continued until February 11. Patient has a PICC line.     7. Has chronic right lower extremity erythema.     8. Fungemia noted on blood cultures 05/08/2013, this is likely due to prolonged antibiotics which required PICC line placement, PICC line will be removed,     he has finished his antibiotics anyways, placed on micafungin. ID consulted, will need replacement of new PICC line once blood deemed clear of  Fungus.     9. Low back pain the last 1 week-  MRI noted, however back pain has resolved, per ID no further workup for back pain. Clinically no suspicion for discitis.     10. Urinary retention on 05/09/2013. Place on Flomax, Foley for now.      Code Status: DO NOT RESUSCITATE.   Family Communication: Patient's sons on daily since the 11,12,13,14,15  Disposition Plan: snf when stable.     Consultants:  ID   Procedures:   PICC line at [facility]     Anti-infectives   Start     Dose/Rate Route Frequency Ordered Stop   05/09/13 1000  micafungin (MYCAMINE) 100 mg in sodium chloride 0.9 % 100 mL IVPB     100 mg  100 mL/hr over 1 Hours Intravenous Daily 05/09/13 0822     05/07/13 1600  Ampicillin-Sulbactam (UNASYN) 3 g in sodium chloride 0.9 % 100 mL IVPB     3 g 100 mL/hr over 60 Minutes Intravenous Every 6 hours 05/07/13 1539 05/08/13 2356   05/06/13 2000  ceFEPIme (MAXIPIME) 2 g in dextrose 5 % 50 mL IVPB  Status:  Discontinued     2 g 100 mL/hr over 30 Minutes Intravenous Every 24 hours 05/05/13 1946 05/05/13 2245   05/06/13  2000  vancomycin (VANCOCIN) IVPB 750 mg/150 ml premix  Status:  Discontinued     750 mg 150 mL/hr over 60 Minutes Intravenous Every 24 hours 05/05/13 2245 05/07/13 1532   05/06/13 0400  piperacillin-tazobactam (ZOSYN) IVPB 3.375 g  Status:  Discontinued     3.375 g 12.5 mL/hr over 240 Minutes Intravenous 3 times per day 05/05/13 2245 05/07/13 1532   05/05/13 1908  vancomycin (VANCOCIN) 1,500 mg in sodium chloride 0.9 % 500 mL IVPB     1,500 mg 250 mL/hr over 120 Minutes Intravenous  Once 05/05/13 1908 05/05/13 2234   05/05/13 1815  ceFEPIme (MAXIPIME) 2 g in dextrose 5 % 50 mL IVPB     2 g 100 mL/hr over 30 Minutes Intravenous  Once 05/05/13 1801 05/05/13 2033      HPI/Subjective: pt remains confused. No new complaints.  Objective: Filed Vitals:   05/12/13 0518  BP: 142/51  Pulse: 73  Temp: 97.6 F (36.4 C)  Resp: 20    Intake/Output Summary (Last 24 hours) at 05/12/13 0847 Last data filed at 05/11/13 1700  Gross per 24 hour  Intake     60 ml  Output    550 ml  Net   -490 ml   Filed Weights   05/09/13 2120 05/10/13 2100 05/11/13 2137  Weight: 81.5 kg (179 lb 10.8 oz) 78.744 kg (173 lb 9.6 oz) 78.5 kg (173 lb 1 oz)    Exam: Alert and sitting on the bed, no distress.  Cardiovascular: S1-S2 heard.  Respiratory: no rhonchi or crepitations.  Abdomen: soft nontender bowel sounds present.  Skin: Skin rash on the right anterior shin which patient's son states is chronic.  Musculoskeletal: no edema   Data Reviewed: Basic Metabolic Panel:  Recent Labs Lab 05/05/13 1823 05/06/13 0430 05/07/13 1030 05/08/13 0455 05/09/13 0445 05/11/13 0415  NA 150* 149* 150* 148*  --  144  K 3.1* 3.3* 4.0 3.6* 4.7 3.5*  CL 109 108 112 111  --  105  CO2 24 25 27 25   --  27  GLUCOSE 132* 114* 153* 154*  --  124*  BUN 36* 33* 26* 22  --  12  CREATININE 1.28 1.05 1.15 1.11  --  0.87  CALCIUM 7.9* 7.9* 8.1* 8.0*  --  8.3*   Liver Function Tests:  Recent Labs Lab 05/05/13 1823  05/06/13 0430  AST 11 11  ALT 14 14  ALKPHOS 34* 32*  BILITOT 0.8 0.8  PROT 6.1 6.2  ALBUMIN 2.3* 2.3*   No results found for this basename: LIPASE, AMYLASE,  in the last 168 hours No results found for this basename: AMMONIA,  in the last 168 hours CBC:  Recent Labs Lab 05/05/13 1823 05/06/13 0430 05/08/13 0455 05/11/13 0415  WBC 8.7 8.7 7.2 6.4  NEUTROABS 7.3 6.7  --   --   HGB 8.1* 8.5* 7.8* 8.0*  HCT 25.7* 27.5* 25.4* 25.8*  MCV 67.3* 67.4* 67.2* 66.3*  PLT  252 244 248 217   Cardiac Enzymes: No results found for this basename: CKTOTAL, CKMB, CKMBINDEX, TROPONINI,  in the last 168 hours BNP (last 3 results) No results found for this basename: PROBNP,  in the last 8760 hours CBG:  Recent Labs Lab 05/11/13 0745 05/11/13 1134 05/11/13 1710 05/11/13 2228 05/12/13 0801  GLUCAP 120* 137* 123* 112* 108*    Recent Results (from the past 240 hour(s))  CULTURE, BLOOD (ROUTINE X 2)     Status: None   Collection Time    05/05/13  5:55 PM      Result Value Ref Range Status   Specimen Description BLOOD RIGHT ARM   Final   Special Requests BOTTLES DRAWN AEROBIC ONLY [facility]   Final   Culture  Setup Time     Final   Value: 05/05/2013 23:31     Performed at [facility]   Culture     Final   Value: YEAST     Note: Gram Stain Report Called to,Read Back By and Verified With: EMMANUEL CASTRO ON 05/08/2013 AT 9:25P BY [employer]     Performed at [facility]   Report Status PENDING   Incomplete  CULTURE, BLOOD (ROUTINE X 2)     Status: None   Collection Time    05/05/13  6:00 PM      Result Value Ref Range Status   Specimen Description BLOOD RIGHT HAND   Final   Special Requests BOTTLES DRAWN AEROBIC AND ANAEROBIC 5CC   Final   Culture  Setup Time     Final   Value: 05/05/2013 23:31     Performed at [facility]   Culture     Final   Value: NO GROWTH 5 DAYS     Performed at [facility]   Report Status 05/11/2013 FINAL   Final  URINE CULTURE      Status: None   Collection Time    05/05/13  7:00 PM      Result Value Ref Range Status   Specimen Description URINE, CATHETERIZED   Final   Special Requests NONE   Final   Culture  Setup Time     Final   Value: 05/05/2013 23:32     Performed at [facility] Count     Final   Value: NO GROWTH     Performed at [facility]   Culture     Final   Value: NO GROWTH     Performed at [facility]   Report Status 05/06/2013 FINAL   Final  MRSA PCR SCREENING     Status: None   Collection Time    05/06/13  5:01 AM      Result Value Ref Range Status   MRSA by PCR NEGATIVE  NEGATIVE Final   Comment:            The GeneXpert MRSA Assay (FDA     approved for NASAL specimens     only), is one component of a     comprehensive MRSA colonization     surveillance program. It is not     intended to diagnose MRSA     infection nor to guide or     monitor treatment for     MRSA infections.  CULTURE, BLOOD (ROUTINE X 2)     Status: None   Collection Time    05/09/13  9:55 AM      Result Value Ref  Range Status   Specimen Description BLOOD HAND RIGHT   Final   Special Requests BOTTLES DRAWN AEROBIC ONLY 10CC   Final   Culture  Setup Time     Final   Value: 05/09/2013 14:36     Performed at [facility]   Culture     Final   Value:        BLOOD CULTURE RECEIVED NO GROWTH TO DATE CULTURE WILL BE HELD FOR 5 DAYS BEFORE ISSUING A FINAL NEGATIVE REPORT     Performed at [facility]   Report Status PENDING   Incomplete  CULTURE, BLOOD (ROUTINE X 2)     Status: None   Collection Time    05/09/13 10:00 AM      Result Value Ref Range Status   Specimen Description BLOOD LEFT ANTECUBITAL   Final   Special Requests BOTTLES DRAWN AEROBIC ONLY 5CC   Final   Culture  Setup Time     Final   Value: 05/09/2013 14:36     Performed at [facility]   Culture     Final   Value:        BLOOD CULTURE RECEIVED NO GROWTH TO DATE CULTURE WILL BE HELD FOR 5  DAYS BEFORE ISSUING A FINAL NEGATIVE REPORT     Performed at [facility]   Report Status PENDING   Incomplete     Studies: Mr Lumbar Spine W Wo Contrast  05/11/2013   CLINICAL DATA:  Evaluate for spinal seeding of fungal infection. Fungemia with acute low back pain. Bacteremia due to enterococcus.  EXAM: MRI LUMBAR SPINE WITHOUT AND WITH CONTRAST  TECHNIQUE: Multiplanar and multiecho pulse sequences of the lumbar spine were obtained without and with intravenous contrast.  CONTRAST:  15mL MULTIHANCE GADOBENATE DIMEGLUMINE 529 MG/ML IV SOLN  COMPARISON:  None.  FINDINGS: The patient had difficulty remaining motionless for the exam despite administration of narcotics. The study is marginally adequate to answer the clinical question although small abnormalities could be overlooked, particularly related to disc pathology and stenosis.  The alignment is anatomic except for 5 mm of facet mediated anterolisthesis L4-5. There are no worrisome osseous lesions.  The L4-5 disc is hyperintense relative to the other normal disc spaces. This is best seen on image 10 series 1000. Furthermore, there is mild enhancement noted on postcontrast images at the L4-5 disc space. No epidural fluid collection, and no evidence for vertebral body enhancement, but the findings could represent early discitis. Recommend short-term 7-10 day MRI follow-up.  Asymmetric facet joint edema is observed at L3-4 on the left. This most often is degenerative in nature. Early septic arthritis could have this appearance, but there is no postcontrast enhancement. Therefore, suspicion is low.  There are central protrusions at L3-4 and L4-5 with mild annular bulging at L1-2, L2-3, and L5-S1. Moderate to severe stenosis is present at L3-4 and L4-5.  The bladder is distended. There is a hypointense structure within the bladder which could represent a bladder calculus. CT abdomen and pelvis could provide further information.  There is a giant right  renal cyst, nearly 10 cm in diameter. There is moderate distention of both renal collecting systems which could represent hydronephrosis or extrarenal pelves. The exam is too motion degraded to differentiate. Renal ultrasound could be helpful in further evaluation.  Advanced facet arthropathy and ligamentum flavum hypertrophy at multiple levels.  IMPRESSION: Severely motion degraded exam.  L4-5 disc hyperintensity and enhancement without associated vertebral body abnormality or  epidural fluid collection. Early discitis is not excluded.  Multilevel spondylosis with severe spinal stenosis at L3-4 and L4-5.  Question bladder distention and bilateral hydronephrosis. Large right renal cyst. Consider renal ultrasound and/or CT abdomen pelvis for further evaluation.   Electronically Signed   By: Davonna BellingJohn  Curnes M.D.   On: 05/11/2013 12:29    Scheduled Meds: . antiseptic oral rinse  15 mL Mouth Rinse BID  . aspirin EC  81 mg Oral Daily  . clotrimazole  1 application Topical BID  . enoxaparin (LOVENOX) injection  40 mg Subcutaneous Q24H  . feeding supplement (ENSURE)  1 Container Oral TID BM  . fluocinonide-emollient   Topical 2 times per day on Mon Tue Wed Thu  . fluticasone  1 spray Each Nare Daily  . insulin aspart  0-9 Units Subcutaneous TID WC  . lactobacillus acidophilus  1 tablet Oral TID  . micafungin [facility](MYCAMINE) IV  100 mg Intravenous Daily  . pantoprazole sodium  40 mg Oral Daily  . polyvinyl alcohol  2 drop Both Eyes TID  . sennosides-docusate sodium  2 tablet Oral BID  . sodium chloride  3 mL Intravenous Q12H  . tamsulosin  0.4 mg Oral QPC breakfast  . vitamin B-12  1,000 mcg Oral Daily   Continuous Infusions: . dextrose 50 mL/hr at 05/10/13 [id number]    Principal Problem:   Fever Active Problems:   Diabetes mellitus without complication   Bacteremia due to Enterococcus   Acute encephalopathy   Hypernatremia   Anemia   Fungemia   Acute low back pain    Time spent: 35  min    Sofia Vanmeter K  [facility] Pager [phone] If 7PM-7AM, please contact night-coverage at [email or web address], password [facility] 05/12/2013, 8:47 AM  LOS: 7 days

## 2013-05-13 DIAGNOSIS — R109 Unspecified abdominal pain: Secondary | ICD-10-CM

## 2013-05-13 DIAGNOSIS — R110 Nausea: Secondary | ICD-10-CM

## 2013-05-13 LAB — GLUCOSE, CAPILLARY
GLUCOSE-CAPILLARY: 105 mg/dL — AB (ref 70–99)
GLUCOSE-CAPILLARY: 132 mg/dL — AB (ref 70–99)
Glucose-Capillary: 106 mg/dL — ABNORMAL HIGH (ref 70–99)
Glucose-Capillary: 134 mg/dL — ABNORMAL HIGH (ref 70–99)

## 2013-05-13 MED ORDER — MIRTAZAPINE 7.5 MG PO TABS
7.5000 mg | ORAL_TABLET | Freq: Every evening | ORAL | Status: DC | PRN
  Administered 2013-05-13 – 2013-05-14 (×2): 7.5 mg via ORAL
  Filled 2013-05-13 (×2): qty 1

## 2013-05-13 MED ORDER — DIPHENHYDRAMINE HCL 25 MG PO CAPS
25.0000 mg | ORAL_CAPSULE | Freq: Once | ORAL | Status: AC
  Administered 2013-05-13: 25 mg via ORAL
  Filled 2013-05-13: qty 1

## 2013-05-13 MED ORDER — ZOLPIDEM TARTRATE 5 MG PO TABS: 5.0000 mg | ORAL_TABLET | Freq: Every evening | ORAL | Status: DC | PRN

## 2013-05-13 NOTE — Clinical Social Work Note (Signed)
Patient not medically stable today for discharge. CSW will continue to monitor patient's progress and [facility] ready for [facility].  Genelle BalVanessa Emmali Karow, MSW, LCSW [phone]

## 2013-05-13 NOTE — Progress Notes (Signed)
Patient ID: Riley Martinez, male   DOB: 02-17-1915, 80 y.o.   MRN: [id number]         [facility] for Infectious Disease    Date of Admission:  05/05/2013           Day 5 micafungin Principal Problem:   Fever Active Problems:   Fungemia   Bacteremia due to Enterococcus   Acute low back pain   Diabetes mellitus without complication   Acute encephalopathy   Hypernatremia   Anemia   . antiseptic oral rinse  15 mL Mouth Rinse BID  . aspirin EC  81 mg Oral Daily  . clotrimazole  1 application Topical BID  . enoxaparin (LOVENOX) injection  40 mg Subcutaneous Q24H  . feeding supplement (ENSURE)  1 Container Oral TID BM  . fluocinonide-emollient   Topical 2 times per day on Mon Tue Wed Thu  . fluticasone  1 spray Each Nare Daily  . insulin aspart  0-9 Units Subcutaneous TID WC  . lactobacillus acidophilus  1 tablet Oral TID  . micafungin [facility]) IV  100 mg Intravenous Daily  . pantoprazole sodium  40 mg Oral Daily  . polyvinyl alcohol  2 drop Both Eyes TID  . sennosides-docusate sodium  2 tablet Oral BID  . sodium chloride  3 mL Intravenous Q12H  . tamsulosin  0.4 mg Oral QPC breakfast  . vitamin B-12  1,000 mcg Oral Daily    Subjective: His son states that he was very restless throughout the night and did not sleep much until falling sound asleep about an hour ago. He has been complaining of some nausea and left-sided abdominal pain. He had one runny stool last night. He has not had any further back pain.  Past Medical History  Diagnosis Date  . GERD (gastroesophageal reflux disease)   . Hypertension   . BPH (benign prostatic hyperplasia)   . Diabetes mellitus without complication   . Constipation     History  Substance Use Topics  . Smoking status: Former Games developer  . Smokeless tobacco: Not on file  . Alcohol Use: Yes     Comment: occasionally has not had for years.    Family History  Problem Relation Age of Onset  . Diabetes Mellitus II Mother      Allergies  Allergen Reactions  . Ativan [Lorazepam] Other (See Comments)    unknown    Objective: Temp:  [97.7 F (36.5 C)-97.8 F (36.6 C)] 97.8 F (36.6 C) (02/15 2156) Pulse Rate:  [67-69] 67 (02/15 2156) Resp:  [22] 22 (02/15 2156) BP: (143-152)/(58-78) 152/78 mmHg (02/15 2156) SpO2:  [97 %] 97 % (02/15 2156) Weight:  [75.388 kg (166 lb 3.2 oz)] 75.388 kg (166 lb 3.2 oz) (02/15 2156)  General: Sleeping soundly. I did not examine him  Lab Results Lab Results  Component Value Date   WBC 6.4 05/11/2013   HGB 8.0* 05/11/2013   HCT 25.8* 05/11/2013   MCV 66.3* 05/11/2013   PLT 217 05/11/2013    Lab Results  Component Value Date   CREATININE 0.73 05/12/2013   BUN 12 05/11/2013   NA 144 05/11/2013   K 3.5* 05/11/2013   CL 105 05/11/2013   CO2 27 05/11/2013    Lab Results  Component Value Date   ALT 14 05/06/2013   AST 11 05/06/2013   ALKPHOS 32* 05/06/2013   BILITOT 0.8 05/06/2013      Microbiology: Recent Results (from the past 240 hour(s))  CULTURE, BLOOD (ROUTINE X  2)     Status: None   Collection Time    05/05/13  5:55 PM      Result Value Ref Range Status   Specimen Description BLOOD RIGHT ARM   Final   Special Requests BOTTLES DRAWN AEROBIC ONLY [facility]   Final   Culture  Setup Time     Final   Value: 05/05/2013 23:31     [facility]   Culture     Final   Value: CANDIDA GLABRATA     Note: Gram Stain Report Called to,Read Back By and Verified With: EMMANUEL CASTRO ON 05/08/2013 AT 9:25P BY WILEJ     [facility]   Report Status 05/12/2013 FINAL   Final  CULTURE, BLOOD (ROUTINE X 2)     Status: None   Collection Time    05/05/13  6:00 PM      Result Value Ref Range Status   Specimen Description BLOOD RIGHT HAND   Final   Special Requests BOTTLES DRAWN AEROBIC AND ANAEROBIC 5CC   Final   Culture  Setup Time     Final   Value: 05/05/2013 23:31     [facility]   Culture     Final   Value: NO GROWTH 5  DAYS     [facility]   Report Status 05/11/2013 FINAL   Final  URINE CULTURE     Status: None   Collection Time    05/05/13  7:00 PM      Result Value Ref Range Status   Specimen Description URINE, CATHETERIZED   Final   Special Requests NONE   Final   Culture  Setup Time     Final   Value: 05/05/2013 23:32     [facility]   Colony Count     Final   Value: NO GROWTH     [facility]   Culture     Final   Value: NO GROWTH     [facility] at [facility]   Report Status 05/06/2013 FINAL   Final  MRSA PCR SCREENING     Status: None   Collection Time    05/06/13  5:01 AM      Result Value Ref Range Status   MRSA by PCR NEGATIVE  NEGATIVE Final   Comment:            The GeneXpert MRSA Assay (FDA     approved for NASAL specimens     only), is one component of a     comprehensive MRSA colonization     surveillance program. It is not     intended to diagnose MRSA     infection nor to guide or     monitor treatment for     MRSA infections.  CULTURE, BLOOD (ROUTINE X 2)     Status: None   Collection Time    05/09/13  9:55 AM      Result Value Ref Range Status   Specimen Description BLOOD HAND RIGHT   Final   Special Requests BOTTLES DRAWN AEROBIC ONLY 10CC   Final   Culture  Setup Time     Final   Value: 05/09/2013 14:36     [facility]   Culture     Final   Value: YEAST     Note: Gram Stain Report Called to,Read Back By and Verified With:  ALBERT RIO 05/12/13 @ 5:02PM BY RUSCOE A.     Performed at [facility]   Report Status PENDING   Incomplete  CULTURE, BLOOD (ROUTINE X 2)     Status: None   Collection Time    05/09/13 10:00 AM      Result Value Ref Range Status   Specimen Description BLOOD LEFT ANTECUBITAL   Final   Special Requests BOTTLES DRAWN AEROBIC ONLY 5CC   Final   Culture  Setup Time     Final   Value: 05/09/2013 14:36     Performed at [facility]    Culture     Final   Value:        BLOOD CULTURE RECEIVED NO GROWTH TO DATE CULTURE WILL BE HELD FOR 5 DAYS BEFORE ISSUING A FINAL NEGATIVE REPORT     Performed at [facility]   Report Status PENDING   Incomplete    Studies/Results: Mr Lumbar Spine W Wo Contrast  05/11/2013   CLINICAL DATA:  Evaluate for spinal seeding of fungal infection. Fungemia with acute low back pain. Bacteremia due to enterococcus.  EXAM: MRI LUMBAR SPINE WITHOUT AND WITH CONTRAST  TECHNIQUE: Multiplanar and multiecho pulse sequences of the lumbar spine were obtained without and with intravenous contrast.  CONTRAST:  15mL MULTIHANCE GADOBENATE DIMEGLUMINE 529 MG/ML IV SOLN  COMPARISON:  None.  FINDINGS: The patient had difficulty remaining motionless for the exam despite administration of narcotics. The study is marginally adequate to answer the clinical question although small abnormalities could be overlooked, particularly related to disc pathology and stenosis.  The alignment is anatomic except for 5 mm of facet mediated anterolisthesis L4-5. There are no worrisome osseous lesions.  The L4-5 disc is hyperintense relative to the other normal disc spaces. This is best seen on image 10 series 1000. Furthermore, there is mild enhancement noted on postcontrast images at the L4-5 disc space. No epidural fluid collection, and no evidence for vertebral body enhancement, but the findings could represent early discitis. Recommend short-term 7-10 day MRI follow-up.  Asymmetric facet joint edema is observed at L3-4 on the left. This most often is degenerative in nature. Early septic arthritis could have this appearance, but there is no postcontrast enhancement. Therefore, suspicion is low.  There are central protrusions at L3-4 and L4-5 with mild annular bulging at L1-2, L2-3, and L5-S1. Moderate to severe stenosis is present at L3-4 and L4-5.  The bladder is distended. There is a hypointense structure within the bladder which could  represent a bladder calculus. CT abdomen and pelvis could provide further information.  There is a giant right renal cyst, nearly 10 cm in diameter. There is moderate distention of both renal collecting systems which could represent hydronephrosis or extrarenal pelves. The exam is too motion degraded to differentiate. Renal ultrasound could be helpful in further evaluation.  Advanced facet arthropathy and ligamentum flavum hypertrophy at multiple levels.  IMPRESSION: Severely motion degraded exam.  L4-5 disc hyperintensity and enhancement without associated vertebral body abnormality or epidural fluid collection. Early discitis is not excluded.  Multilevel spondylosis with severe spinal stenosis at L3-4 and L4-5.  Question bladder distention and bilateral hydronephrosis. Large right renal cyst. Consider renal ultrasound and/or CT abdomen pelvis for further evaluation.   Electronically Signed   By: Davonna Belling M.D.   On: 05/11/2013 12:29    Assessment: He has Candida glabrata fungemia and repeat cultures on February 12 are again positive before he started like a function.  He will need to stay on IV micafungin and I will repeat blood cultures again today. He will need a minimum of 2 weeks of IV micafungin therapy following first negative blood culture. I've talked with her son about trying to arrange ophthalmologic examination after discharge. It should be safe to go ahead and place a PICC now.  I agree with checking C. difficile PCR now that he is having some nausea, or abdominal pain and loose stool.  I do not think that he is likely to have infectious lumbar discitis given lack of back pain.  Plan: 1. Continue micafungin 2. Repeat blood cultures today 3. Okay to place a PICC 4. Stool for C. difficile PCR  Cliffton Asters, MD [facility] for Infectious Disease [facility] [phone] pager   [phone] cell 05/13/2013, 9:29 AM

## 2013-05-13 NOTE — Progress Notes (Signed)
[facility] PROGRESS NOTE  Riley Martinez WUJ:[id number] DOB: 12-Aug-1914 DOA: 05/05/2013 PCP: No primary provider on file.   Brief HPI:   Riley Martinez is a 80 y.o. male who was recently admitted at [facility] with sepsis secondary to enterococcus UTI and bacteremia, at that time patient also was found to have decompensated CHF and was eventually discharged to rehabilitation. patient was found to be unresponsive and was brought to the ER. Patient in the ER and CT head which was unremarkable. Labs revealed hypernatremia and hypokalemia with leukocytosis and anemia. Chest x-ray shows possible infiltrates and patient was found to be febrile. He was admitted by medical service and is managed for acute encephalopathy.    He has finished his antibiotics for pneumonia and enterococcal UTI with enterococcal bacteremia, he now has fungemia likely from long-standing PICC line which has been removed. He had back pain which has now resolved. Has been seen by ID.    Assessment/Plan:   1. Acute encephalopathy secondary to health care associated pneumonia/enterococcal UTI bacteremia - possibly secondary to infective and metabolic reasons. Has finished his antibiotic course, chest x-ray only shows mild edema, cultures negative afebrile with no leukocytosis.     2. Hypernatremia and hypokalemia - probably from dehydration and poor intake and patient being on Lasix. Sodium improved after hydration, potassium replaced and monitored.     3. History of chronic systolic CHF with last EF measured 30-35% per patient's son - this patient was initially hypotensive and presently hypernatremic - mild acute on chronic systolic CHF after hydration, improving with gentle Lasix.     4. Anemia - baseline hemoglobin not known. Patients son states that patient has thalassemia. There was no mention of any GI bleed or black stools. Closely follow CBC. [facility]. Goal will be to keep hemoglobin above 7 he is asymptomatic.     5. History of diabetes mellitus type 2 on diet and sliding-scale coverage - closely follow CBGs with sliding-scale coverage.   CBG (last 3)   Recent Labs  05/12/13 1711 05/12/13 2152 05/13/13 0838  GLUCAP 112* 111* 105*     6. Recent admission [facility] for enterococcus UTI sepsis and bacteremia - patient was on Unasyn and was to be continued until February 11. Patient has a PICC line.     7. Has chronic right lower extremity erythema. No inpatient workup needed.     8. Fungemia noted on blood cultures 05/08/2013, this is likely due to prolonged antibiotics which required PICC line placement, PICC line will be removed,     he has finished his antibiotics anyways, placed on micafungin. ID consulted, ID clear for new PICC line placement which has been ordered on 05/13/2013.     9. Low back pain the last 1 week-  MRI noted, however back pain has resolved, per ID no further workup for back pain. Clinically no suspicion for discitis.     10. Urinary retention on 05/09/2013. Place on Flomax, Foley for now. We'll remove Foley and give a trial of a Foley.      Code Status: DO NOT RESUSCITATE.   Family Communication: Patient's sons and niece on daily since the 11,12,13,14,15,16  Disposition Plan: snf when stable.     Consultants:  ID   Procedures:   [facility] line [facility]     Anti-infectives   Start     Dose/Rate Route Frequency Ordered Stop   05/09/13 1000  micafungin (MYCAMINE) 100  mg in sodium chloride 0.9 % 100 mL IVPB     100 mg 100 mL/hr over 1 Hours Intravenous Daily 05/09/13 0822     05/07/13 1600  Ampicillin-Sulbactam (UNASYN) 3 g in sodium chloride 0.9 % 100 mL IVPB     3 g 100 mL/hr over 60 Minutes Intravenous Every 6 hours 05/07/13 1539 05/08/13 2356   05/06/13 2000  ceFEPIme (MAXIPIME) 2 g in dextrose 5 % 50 mL IVPB  Status:  Discontinued     2 g 100  mL/hr over 30 Minutes Intravenous Every 24 hours 05/05/13 1946 05/05/13 2245   05/06/13 2000  vancomycin (VANCOCIN) IVPB 750 mg/150 ml premix  Status:  Discontinued     750 mg 150 mL/hr over 60 Minutes Intravenous Every 24 hours 05/05/13 2245 05/07/13 1532   05/06/13 0400  piperacillin-tazobactam (ZOSYN) IVPB 3.375 g  Status:  Discontinued     3.375 g 12.5 mL/hr over 240 Minutes Intravenous 3 times per day 05/05/13 2245 05/07/13 1532   05/05/13 1908  vancomycin (VANCOCIN) 1,500 mg in sodium chloride 0.9 % 500 mL IVPB     1,500 mg 250 mL/hr over 120 Minutes Intravenous  Once 05/05/13 1908 05/05/13 2234   05/05/13 1815  ceFEPIme (MAXIPIME) 2 g in dextrose 5 % 50 mL IVPB     2 g 100 mL/hr over 30 Minutes Intravenous  Once 05/05/13 1801 05/05/13 2033      HPI/Subjective: pt remains confused. No new complaints.  Objective: Filed Vitals:   05/13/13 1055  BP: 146/68  Pulse: 104  Temp: 97.8 F (36.6 C)  Resp: 20    Intake/Output Summary (Last 24 hours) at 05/13/13 1102 Last data filed at 05/12/13 2230  Gross per 24 hour  Intake    180 ml  Output   1000 ml  Net   -820 ml   Filed Weights   05/10/13 2100 05/11/13 2137 05/12/13 2156  Weight: 78.744 kg (173 lb 9.6 oz) 78.5 kg (173 lb 1 oz) 75.388 kg (166 lb 3.2 oz)    Exam: Alert and sitting on the bed, no distress.  Cardiovascular: S1-S2 heard.  Respiratory: no rhonchi or crepitations.  Abdomen: soft nontender bowel sounds present.  Skin: Skin rash on the right anterior shin which patient's son states is chronic.  Musculoskeletal: no edema   Data Reviewed: Basic Metabolic Panel:  Recent Labs Lab 05/07/13 1030 05/08/13 0455 05/09/13 0445 05/11/13 0415 05/12/13 1017  NA 150* 148*  --  144  --   K 4.0 3.6* 4.7 3.5*  --   CL 112 111  --  105  --   CO2 27 25  --  27  --   GLUCOSE 153* 154*  --  124*  --   BUN 26* 22  --  12  --   CREATININE 1.15 1.11  --  0.87 0.73  CALCIUM 8.1* 8.0*  --  8.3*  --    Liver Function  Tests: No results found for this basename: AST, ALT, ALKPHOS, BILITOT, PROT, ALBUMIN,  in the last 168 hours No results found for this basename: LIPASE, AMYLASE,  in the last 168 hours No results found for this basename: AMMONIA,  in the last 168 hours CBC:  Recent Labs Lab 05/08/13 0455 05/11/13 0415  WBC 7.2 6.4  HGB 7.8* 8.0*  HCT 25.4* 25.8*  MCV 67.2* 66.3*  PLT 248 217   Cardiac Enzymes: No results found for this basename: CKTOTAL, CKMB, CKMBINDEX, TROPONINI,  in the last 168  hours BNP (last 3 results) No results found for this basename: PROBNP,  in the last 8760 hours CBG:  Recent Labs Lab 05/12/13 0801 05/12/13 1130 05/12/13 1711 05/12/13 2152 05/13/13 0838  GLUCAP 108* 135* 112* 111* 105*    Recent Results (from the past 240 hour(s))  CULTURE, BLOOD (ROUTINE X 2)     Status: None   Collection Time    05/05/13  5:55 PM      Result Value Ref Range Status   Specimen Description BLOOD RIGHT ARM   Final   Special Requests BOTTLES DRAWN AEROBIC ONLY [facility]   Final   Culture  Setup Time     Final   Value: 05/05/2013 23:31     Performed at [facility]   Culture     Final   Value: CANDIDA GLABRATA     Note: Gram Stain Report Called to,Read Back By and Verified With: EMMANUEL CASTRO ON 05/08/2013 AT 9:25P BY WILEJ     Performed at [facility]   Report Status 05/12/2013 FINAL   Final  CULTURE, BLOOD (ROUTINE X 2)     Status: None   Collection Time    05/05/13  6:00 PM      Result Value Ref Range Status   Specimen Description BLOOD RIGHT HAND   Final   Special Requests BOTTLES DRAWN AEROBIC AND ANAEROBIC 5CC   Final   Culture  Setup Time     Final   Value: 05/05/2013 23:31     Performed at [facility]   Culture     Final   Value: NO GROWTH 5 DAYS     Performed at [facility]   Report Status 05/11/2013 FINAL   Final  URINE CULTURE     Status: None   Collection Time    05/05/13  7:00 PM      Result Value Ref Range Status    Specimen Description URINE, CATHETERIZED   Final   Special Requests NONE   Final   Culture  Setup Time     Final   Value: 05/05/2013 23:32     Performed at [facility] Count     Final   Value: NO GROWTH     Performed at [facility]   Culture     Final   Value: NO GROWTH     Performed at [facility]   Report Status 05/06/2013 FINAL   Final  MRSA PCR SCREENING     Status: None   Collection Time    05/06/13  5:01 AM      Result Value Ref Range Status   MRSA by PCR NEGATIVE  NEGATIVE Final   Comment:            The GeneXpert MRSA Assay (FDA     approved for NASAL specimens     only), is one component of a     comprehensive MRSA colonization     surveillance program. It is not     intended to diagnose MRSA     infection nor to guide or     monitor treatment for     MRSA infections.  CULTURE, BLOOD (ROUTINE X 2)     Status: None   Collection Time    05/09/13  9:55 AM      Result Value Ref Range Status   Specimen Description BLOOD HAND RIGHT   Final   Special Requests BOTTLES DRAWN AEROBIC ONLY 10CC  Final   Culture  Setup Time     Final   Value: 05/09/2013 14:36     Performed at [facility]   Culture     Final   Value: YEAST     Note: Gram Stain Report Called to,Read Back By and Verified With: ALBERT RIO 05/12/13 @ 5:02PM BY RUSCOE A.     Performed at [facility]   Report Status PENDING   Incomplete  CULTURE, BLOOD (ROUTINE X 2)     Status: None   Collection Time    05/09/13 10:00 AM      Result Value Ref Range Status   Specimen Description BLOOD LEFT ANTECUBITAL   Final   Special Requests BOTTLES DRAWN AEROBIC ONLY 5CC   Final   Culture  Setup Time     Final   Value: 05/09/2013 14:36     Performed at [facility]   Culture     Final   Value:        BLOOD CULTURE RECEIVED NO GROWTH TO DATE CULTURE WILL BE HELD FOR 5 DAYS BEFORE ISSUING A FINAL NEGATIVE REPORT     Performed at [facility]   Report  Status PENDING   Incomplete     Studies: Mr Lumbar Spine W Wo Contrast  05/11/2013   CLINICAL DATA:  Evaluate for spinal seeding of fungal infection. Fungemia with acute low back pain. Bacteremia due to enterococcus.  EXAM: MRI LUMBAR SPINE WITHOUT AND WITH CONTRAST  TECHNIQUE: Multiplanar and multiecho pulse sequences of the lumbar spine were obtained without and with intravenous contrast.  CONTRAST:  15mL MULTIHANCE GADOBENATE DIMEGLUMINE 529 MG/ML IV SOLN  COMPARISON:  None.  FINDINGS: The patient had difficulty remaining motionless for the exam despite administration of narcotics. The study is marginally adequate to answer the clinical question although small abnormalities could be overlooked, particularly related to disc pathology and stenosis.  The alignment is anatomic except for 5 mm of facet mediated anterolisthesis L4-5. There are no worrisome osseous lesions.  The L4-5 disc is hyperintense relative to the other normal disc spaces. This is best seen on image 10 series 1000. Furthermore, there is mild enhancement noted on postcontrast images at the L4-5 disc space. No epidural fluid collection, and no evidence for vertebral body enhancement, but the findings could represent early discitis. Recommend short-term 7-10 day MRI follow-up.  Asymmetric facet joint edema is observed at L3-4 on the left. This most often is degenerative in nature. Early septic arthritis could have this appearance, but there is no postcontrast enhancement. Therefore, suspicion is low.  There are central protrusions at L3-4 and L4-5 with mild annular bulging at L1-2, L2-3, and L5-S1. Moderate to severe stenosis is present at L3-4 and L4-5.  The bladder is distended. There is a hypointense structure within the bladder which could represent a bladder calculus. CT abdomen and pelvis could provide further information.  There is a giant right renal cyst, nearly 10 cm in diameter. There is moderate distention of both renal collecting  systems which could represent hydronephrosis or extrarenal pelves. The exam is too motion degraded to differentiate. Renal ultrasound could be helpful in further evaluation.  Advanced facet arthropathy and ligamentum flavum hypertrophy at multiple levels.  IMPRESSION: Severely motion degraded exam.  L4-5 disc hyperintensity and enhancement without associated vertebral body abnormality or epidural fluid collection. Early discitis is not excluded.  Multilevel spondylosis with severe spinal stenosis at L3-4 and L4-5.  Question bladder distention and bilateral hydronephrosis.  Large right renal cyst. Consider renal ultrasound and/or CT abdomen pelvis for further evaluation.   Electronically Signed   By: Davonna Belling M.D.   On: 05/11/2013 12:29    Scheduled Meds: . antiseptic oral rinse  15 mL Mouth Rinse BID  . aspirin EC  81 mg Oral Daily  . clotrimazole  1 application Topical BID  . enoxaparin (LOVENOX) injection  40 mg Subcutaneous Q24H  . feeding supplement (ENSURE)  1 Container Oral TID BM  . fluocinonide-emollient   Topical 2 times per day on Mon Tue Wed Thu  . fluticasone  1 spray Each Nare Daily  . insulin aspart  0-9 Units Subcutaneous TID WC  . lactobacillus acidophilus  1 tablet Oral TID  . micafungin [facility]) IV  100 mg Intravenous Daily  . pantoprazole sodium  40 mg Oral Daily  . polyvinyl alcohol  2 drop Both Eyes TID  . sennosides-docusate sodium  2 tablet Oral BID  . sodium chloride  3 mL Intravenous Q12H  . tamsulosin  0.4 mg Oral QPC breakfast  . vitamin B-12  1,000 mcg Oral Daily   Continuous Infusions: . dextrose 50 mL/hr at 05/10/13 1610    Principal Problem:   Fever Active Problems:   Diabetes mellitus without complication   Bacteremia due to Enterococcus   Acute encephalopathy   Hypernatremia   Anemia   Fungemia   Acute low back pain    Time spent: 35 min    SINGH,PRASHANT K  [facility] Pager [phone] If 7PM-7AM, please contact night-coverage at  [email or web address], password [email or web address] 05/13/2013, 11:02 AM  LOS: 8 days

## 2013-05-13 NOTE — Progress Notes (Signed)
Pt's foley was d/c'd at 1300. Pt had not void at 1830 and was bladder scanned which showed he had >200cc of urine in his bladder. MD was notified and gave orders to I&O cath patient. Orders written and carried out. 1200cc was emptied out of pt's bladder. Will f/u.

## 2013-05-13 NOTE — Progress Notes (Signed)
Patient requesting to be reposition every 3-5 minutes and is unable to get comfortable.  Patient denies pain.  Patient's son is concerned about patient's restlessness and is also concerned that patient may have c-diff.  Patient's stool is soft and loose.  Lynford Citizenandall Reidler, PA notified of patient and family concerns.  New orders received.  Will continue to monitor.

## 2013-05-14 LAB — CBC
HCT: 30.4 % — ABNORMAL LOW (ref 39.0–52.0)
Hemoglobin: 9.3 g/dL — ABNORMAL LOW (ref 13.0–17.0)
MCH: 20.5 pg — ABNORMAL LOW (ref 26.0–34.0)
MCHC: 30.6 g/dL (ref 30.0–36.0)
MCV: 67.1 fL — ABNORMAL LOW (ref 78.0–100.0)
PLATELETS: 265 10*3/uL (ref 150–400)
RBC: 4.53 MIL/uL (ref 4.22–5.81)
RDW: 15.5 % (ref 11.5–15.5)
WBC: 8.9 10*3/uL (ref 4.0–10.5)

## 2013-05-14 LAB — BASIC METABOLIC PANEL
BUN: 11 mg/dL (ref 6–23)
CALCIUM: 8.4 mg/dL (ref 8.4–10.5)
CO2: 27 mEq/L (ref 19–32)
CREATININE: 0.91 mg/dL (ref 0.50–1.35)
Chloride: 107 mEq/L (ref 96–112)
GFR calc non Af Amer: 69 mL/min — ABNORMAL LOW (ref >90)
GFR, EST AFRICAN AMERICAN: 80 mL/min — AB (ref >90)
Glucose, Bld: 103 mg/dL — ABNORMAL HIGH (ref 70–99)
Potassium: 3.5 mEq/L — ABNORMAL LOW (ref 3.7–5.3)
Sodium: 146 mEq/L (ref 137–147)

## 2013-05-14 LAB — CLOSTRIDIUM DIFFICILE BY PCR: Toxigenic C. Difficile by PCR: NEGATIVE

## 2013-05-14 LAB — GLUCOSE, CAPILLARY
GLUCOSE-CAPILLARY: 96 mg/dL (ref 70–99)
GLUCOSE-CAPILLARY: 126 mg/dL — AB (ref 70–99)
Glucose-Capillary: 122 mg/dL — ABNORMAL HIGH (ref 70–99)
Glucose-Capillary: 110 mg/dL — ABNORMAL HIGH (ref 70–99)

## 2013-05-14 MED ORDER — RESOURCE THICKENUP CLEAR PO POWD: ORAL | Status: DC

## 2013-05-14 MED ORDER — SODIUM CHLORIDE 0.9 % IV SOLN: 100.0000 mg | Freq: Every day | INTRAVENOUS | Status: DC

## 2013-05-14 MED ORDER — POTASSIUM CHLORIDE CRYS ER 20 MEQ PO TBCR
40.0000 meq | EXTENDED_RELEASE_TABLET | Freq: Once | ORAL | Status: AC
  Administered 2013-05-14: 40 meq via ORAL
  Filled 2013-05-14: qty 2

## 2013-05-14 MED ORDER — MIRTAZAPINE 15 MG PO TABS: 7.5000 mg | ORAL_TABLET | Freq: Every day | ORAL | Status: DC

## 2013-05-14 MED ORDER — SODIUM CHLORIDE 0.9 % IJ SOLN: 10.0000 mL | INTRAMUSCULAR | Status: DC | PRN

## 2013-05-14 MED ORDER — ENSURE PUDDING PO PUDG: 1.0000 | Freq: Three times a day (TID) | ORAL | Status: DC

## 2013-05-14 NOTE — Progress Notes (Signed)
Peripherally Inserted Central Catheter/Midline Placement  The IV Nurse has discussed with the patient and/or persons authorized to consent for the patient, the purpose of this procedure and the potential benefits and risks involved with this procedure.  The benefits include less needle sticks, lab draws from the catheter and patient may be discharged home with the catheter.  Risks include, but not limited to, infection, bleeding, blood clot (thrombus formation), and puncture of an artery; nerve damage and irregular heat beat.  Alternatives to this procedure were also discussed.  PICC/Midline Placement Documentation  PICC / Midline Single Lumen 05/14/13 PICC Right Basilic 41 cm 3 cm (Active)       Lisabeth DevoidGibbs, Amani Nodarse Jeanette 05/14/2013, 9:25 AM Consent obtained yesterday by Warrick ParisianKerry Cochren, RN from son.

## 2013-05-14 NOTE — Discharge Instructions (Signed)
Follow with Primary MD  in 7 days,  follow repeat Blood culture results  Get CBC, CMP, checked 7 days by Primary MD and again as instructed by your Primary MD.    Activity: As tolerated with Full fall precautions use walker/cane & assistance as needed   Disposition Home     Diet: Heart Healthy dysphagia 2 with honey thick liquids, feeding assistance and aspiration precautions if needed.  For Heart failure patients - Check your Weight same time everyday, if you gain over 2 pounds, or you develop in leg swelling, experience more shortness of breath or chest pain, call your Primary MD immediately. Follow Cardiac Low Salt Diet and 1.8 lit/day fluid restriction.   On your next visit with her primary care physician please Get Medicines reviewed and adjusted.  Please request your Prim.MD to go over all Hospital Tests and Procedure/Radiological results at the follow up, please get all Hospital records sent to your Prim MD by signing hospital release before you go home.   If you experience worsening of your admission symptoms, develop shortness of breath, life threatening emergency, suicidal or homicidal thoughts you must seek medical attention immediately by calling 911 or calling your MD immediately  if symptoms less severe.  You Must read complete instructions/literature along with all the possible adverse reactions/side effects for all the Medicines you take and that have been prescribed to you. Take any new Medicines after you have completely understood and accpet all the possible adverse reactions/side effects.   Do not drive and provide baby sitting services if your were admitted for syncope or siezures until you have seen by Primary MD or a Neurologist and advised to do so again.  Do not drive when taking Pain medications.    Do not take more than prescribed Pain, Sleep and Anxiety Medications  Special Instructions: If you have smoked or chewed Tobacco  in the last 2 yrs please stop  smoking, stop any regular Alcohol  and or any Recreational drug use.  Wear Seat belts while driving.   Please note  You were cared for by a hospitalist during your hospital stay. If you have any questions about your discharge medications or the care you received while you were in the hospital after you are discharged, you can call the unit and asked to speak with the hospitalist on call if the hospitalist that took care of you is not available. Once you are discharged, your primary care physician will handle any further medical issues. Please note that NO REFILLS for any discharge medications will be authorized once you are discharged, as it is imperative that you return to your primary care physician (or establish a relationship with a primary care physician if you do not have one) for your aftercare needs so that they can reassess your need for medications and monitor your lab values.

## 2013-05-14 NOTE — Clinical Social Work Note (Signed)
CSW informed that patient ready for discharge today. [facility] and [facility] to inform her that patient ready today. CSW later received call from Deri FuellingMarie Smallwood with [facility] patient and informed CSW that approval given for 3 days (initially) for ST rehab. Approval number is [id number] and CSW informed that patient does not need ambulance authorization as he has PPO Blue Medicare. CSW informed Ms. Smallwood that d/c would be 2/18 due to pharmacy being closed, and CSW advised that authorization good through 2/19. Ms. Earley FavorSmallwood advised CSW that she will [facility].  Genelle BalVanessa Lillyen Schow, MSW, LCSW [phone]

## 2013-05-14 NOTE — Progress Notes (Addendum)
Pt still unable to void. Bladder scan shows > 200 cc. Foley catheter placed. Tolerated well. 550 cc amber urine with sediment returned.

## 2013-05-14 NOTE — Progress Notes (Signed)
Patient ID: Riley Martinez, male   DOB: 07/21/1914, 80 y.o.   MRN: [id number]         [facility] for Infectious Disease    Date of Admission:  05/05/2013           Day 6 micafungin Principal Problem:   Fever Active Problems:   Fungemia   Bacteremia due to Enterococcus   Acute low back pain   Diabetes mellitus without complication   Acute encephalopathy   Hypernatremia   Anemia   . antiseptic oral rinse  15 mL Mouth Rinse BID  . aspirin EC  81 mg Oral Daily  . clotrimazole  1 application Topical BID  . enoxaparin (LOVENOX) injection  40 mg Subcutaneous Q24H  . feeding supplement (ENSURE)  1 Container Oral TID BM  . fluocinonide-emollient   Topical 2 times per day on Mon Tue Wed Thu  . fluticasone  1 spray Each Nare Daily  . insulin aspart  0-9 Units Subcutaneous TID WC  . lactobacillus acidophilus  1 tablet Oral TID  . micafungin [facility]) IV  100 mg Intravenous Daily  . pantoprazole sodium  40 mg Oral Daily  . polyvinyl alcohol  2 drop Both Eyes TID  . sennosides-docusate sodium  2 tablet Oral BID  . sodium chloride  3 mL Intravenous Q12H  . tamsulosin  0.4 mg Oral QPC breakfast  . vitamin B-12  1,000 mcg Oral Daily    Objective: Temp:  [97.7 F (36.5 C)-98.5 F (36.9 C)] 98 F (36.7 C) (02/17 1236) Pulse Rate:  [64-105] 64 (02/17 1236) Resp:  [20-23] 21 (02/17 1236) BP: (129-156)/(63-79) 156/63 mmHg (02/17 1236) SpO2:  [91 %-95 %] 95 % (02/17 1236) Weight:  [75.921 kg (167 lb 6 oz)] 75.921 kg (167 lb 6 oz) (02/16 2109)  General: He is extremely weak and requires full systems to get from chair to bed. Skin: No rash Lungs: Clear Cor: Regular S1-S2 no murmurs Abdomen: Nontender. Some soft bowel movements but no diarrhea  Lab Results Lab Results  Component Value Date   WBC 8.9 05/14/2013   HGB 9.3* 05/14/2013   HCT 30.4* 05/14/2013   MCV 67.1* 05/14/2013   PLT 265 05/14/2013    Lab Results  Component Value Date   CREATININE 0.91 05/14/2013   BUN 11  05/14/2013   NA 146 05/14/2013   K 3.5* 05/14/2013   CL 107 05/14/2013   CO2 27 05/14/2013    Lab Results  Component Value Date   ALT 14 05/06/2013   AST 11 05/06/2013   ALKPHOS 32* 05/06/2013   BILITOT 0.8 05/06/2013      Microbiology: Recent Results (from the past 240 hour(s))  CULTURE, BLOOD (ROUTINE X 2)     Status: None   Collection Time    05/05/13  5:55 PM      Result Value Ref Range Status   Specimen Description BLOOD RIGHT ARM   Final   Special Requests BOTTLES DRAWN AEROBIC ONLY [facility]   Final   Culture  Setup Time     Final   Value: 05/05/2013 23:31     Performed at [facility]   Culture     Final   Value: CANDIDA GLABRATA     Note: Gram Stain Report Called to,Read Back By and Verified With: [facility] CASTRO ON 05/08/2013 AT 9:25P BY Serafina Mitchell     Performed at [facility]   Report Status 05/12/2013 FINAL   Final  CULTURE, BLOOD (ROUTINE X 2)  Status: None   Collection Time    05/05/13  6:00 PM      Result Value Ref Range Status   Specimen Description BLOOD RIGHT HAND   Final   Special Requests BOTTLES DRAWN AEROBIC AND ANAEROBIC 5CC   Final   Culture  Setup Time     Final   Value: 05/05/2013 23:31     [facility]   Culture     Final   Value: NO GROWTH 5 DAYS     Performed at [facility]   Report Status 05/11/2013 FINAL   Final  URINE CULTURE     Status: None   Collection Time    05/05/13  7:00 PM      Result Value Ref Range Status   Specimen Description URINE, CATHETERIZED   Final   Special Requests NONE   Final   Culture  Setup Time     Final   Value: 05/05/2013 23:32     [facility]formed at [facility]   Colony Count     Final   Value: NO GROWTH     Performed at [facility]   Culture     Final   Value: NO GROWTH     Performed at [facility]   Report Status 05/06/2013 FINAL   Final  MRSA PCR SCREENING     Status: None   Collection Time    05/06/13  5:01 AM      Result Value Ref Range Status    MRSA by PCR NEGATIVE  NEGATIVE Final   Comment:            The GeneXpert MRSA Assay (FDA     approved for NASAL specimens     only), is one component of a     comprehensive MRSA colonization     surveillance program. It is not     intended to diagnose MRSA     infection nor to guide or     monitor treatment for     MRSA infections.  CULTURE, BLOOD (ROUTINE X 2)     Status: None   Collection Time    05/09/13  9:55 AM      Result Value Ref Range Status   Specimen Description BLOOD HAND RIGHT   Final   Special Requests BOTTLES DRAWN AEROBIC ONLY 10CC   Final   Culture  Setup Time     Final   Value: 05/09/2013 14:36     Performed at [facility]   Culture     Final   Value: YEAST     Note: Gram Stain Report Called to,Read Back By and Verified With: ALBERT RIO 05/12/13 @ 5:02PM BY RUSCOE A.     Performed at [facility]   Report Status PENDING   Incomplete  CULTURE, BLOOD (ROUTINE X 2)     Status: None   Collection Time    05/09/13 10:00 AM      Result Value Ref Range Status   Specimen Description BLOOD LEFT ANTECUBITAL   Final   Special Requests BOTTLES DRAWN AEROBIC ONLY 5CC   Final   Culture  Setup Time     Final   Value: 05/09/2013 14:36     Performed at [facility]   Culture     Final   Value:        BLOOD CULTURE RECEIVED NO GROWTH TO DATE CULTURE WILL BE HELD FOR 5 DAYS BEFORE ISSUING A  FINAL NEGATIVE REPORT     Performed at [facility]   Report Status PENDING   Incomplete  CULTURE, BLOOD (ROUTINE X 2)     Status: None   Collection Time    05/13/13 12:45 PM      Result Value Ref Range Status   Specimen Description BLOOD RIGHT HAND   Final   Special Requests BOTTLES DRAWN AEROBIC AND ANAEROBIC 3CC   Final   Culture  Setup Time     Final   Value: 05/13/2013 16:16     Performed at [facility]   Culture     Final   Value:        BLOOD CULTURE RECEIVED NO GROWTH TO DATE CULTURE WILL BE HELD FOR 5 DAYS BEFORE ISSUING A FINAL  NEGATIVE REPORT     Performed at [facility]   Report Status PENDING   Incomplete  CULTURE, BLOOD (ROUTINE X 2)     Status: None   Collection Time    05/13/13  1:45 PM      Result Value Ref Range Status   Specimen Description BLOOD RIGHT ARM   Final   Special Requests BOTTLES DRAWN AEROBIC AND ANAEROBIC 10CC   Final   Culture  Setup Time     Final   Value: 05/13/2013 16:47     Performed at [facility]   Culture     Final   Value:        BLOOD CULTURE RECEIVED NO GROWTH TO DATE CULTURE WILL BE HELD FOR 5 DAYS BEFORE ISSUING A FINAL NEGATIVE REPORT     Performed at [facility]   Report Status PENDING   Incomplete  CLOSTRIDIUM DIFFICILE BY PCR     Status: None   Collection Time    05/14/13 10:44 AM      Result Value Ref Range Status   C difficile by pcr NEGATIVE  NEGATIVE Final    Assessment: He has had back-to-back bloodstream infections. Initial was an episode of bacteremia enterococcal UTI followed by his current episode of Candida glabrata fungemia. His significant decline in functional status and has lost about 20 pounds according to his son. He is now largely bedridden and poorly responsive. Repeat blood cultures are pending. A PICC has been placed. He will need 2 weeks of IV micafungin starting from the date of first negative blood culture. I had a long discussion with the son about establishing clear goals of care. I will let him know that while is still hoping that current treatments might allow him to recover, there is also the possibility that he may not regain much appetite her strength and this could precipitate a steady decline in health.  Plan: 1. Continue micafungin at least 2 more weeks depending on results of yesterday's blood cultures  Cliffton Asters, MD [facility] for Infectious Disease [facility] [phone] pager   [phone] cell 05/14/2013, 3:55 PM

## 2013-05-14 NOTE — Progress Notes (Signed)
Physical Therapy Treatment Patient Details Name: Riley Martinez MRN: [id number] DOB: 1914-06-07 Today's Date: 05/14/2013 Time: [id number] PT Time Calculation (min): 29 min  PT Assessment / Plan / Recommendation  History of Present Illness Riley Martinez is a 80 y.o. male who [facility] with sepsis secondary to enterococcus UTI, at that time patient also was found to have decompensated CHF and was eventually discharged to rehabilitation.  Today patient was found to be unresponsive.  Chest x-ray shows possible infiltrates and patient was found to be febrile.    PT Comments   Patient with decreased standing tolerance/strength.  Son feels is due to restless sleep.  Feel patient's motivation is waning.  Will follow acutely and continue to recommend SNF level rehab.  Follow Up Recommendations  SNF     Does the patient have the potential to tolerate intense rehabilitation   N/A  Barriers to Discharge  None      Equipment Recommendations  None recommended by PT    Recommendations for Other Services  None  Frequency Min 2X/week   Progress towards PT Goals Progress towards PT goals: Not progressing toward goals - comment (due to lethargy)  Plan Current plan remains appropriate    Precautions / Restrictions Precautions Precautions: Fall   Pertinent Vitals/Pain No pain complaints    Mobility  Bed Mobility General bed mobility comments: [facility] Overall transfer level: Needs assistance Equipment used: Rolling walker (2 wheeled) Transfers: Sit to/from Stand Sit to Stand: Max assist;+2 physical assistance Stand pivot transfers: +2 physical assistance;Max assist General transfer comment: cues and assist to lift trunk and keep legs from buckling with attempts at stepping forward and around to chair Ambulation/Gait Ambulation/Gait assistance: +2 physical assistance;Max assist    Exercises General Exercises - Upper  Extremity Shoulder Flexion: AAROM;Both;5 reps;Seated General Exercises - Lower Extremity Hip Flexion/Marching: AAROM;Both;5 reps;Seated     PT Goals (current goals can now be found in the care plan section)    Visit Information  Last PT Received On: 05/14/13 Assistance Needed: +2 History of Present Illness: Riley Martinez is a 80 y.o. male who was [facility] with sepsis secondary to enterococcus UTI, at that time patient also was found to have decompensated CHF and was eventually discharged to rehabilitation.  Today patient was found to be unresponsive.  Chest x-ray shows possible infiltrates and patient was found to be febrile.     Subjective Data      Cognition  Cognition Arousal/Alertness: Awake/alert Behavior During Therapy: WFL for tasks assessed/performed Overall Cognitive Status: Within Functional Limits for tasks assessed    Balance  Balance Standing balance support: Bilateral upper extremity supported Standing balance-Riley Martinez Scale: Poor Standing balance comment: increasing support needed for balance and to keep legs from buckling  End of Session PT - End of Session Equipment Utilized During Treatment: Gait belt Activity Tolerance: Patient limited by fatigue Patient left: in chair;with call bell/phone within reach;with family/visitor present   GP     [facility],Riley Martinez 05/14/2013, 2:01 PM Sheran Lawlessyndi Riley Martinez, PT [phone] 05/14/2013

## 2013-05-14 NOTE — Discharge Summary (Addendum)
Riley Martinez, is a 80 y.o. male  DOB [birth year]  MRN [id number].  Admission date:  05/05/2013  Admitting Physician  Eduard Clos, MD  Discharge Date:  05/15/2013   Primary MD  No primary provider on file.  Recommendations for primary care physician for things to follow:   Repeat CBC, BMP in 4-5 days, follow repeat blood culture results.   Admission Diagnosis  Sepsis   Discharge Diagnosis  Sepsis,  Candida bacteremia, acute urinary retention  Principal Problem:   Fever Active Problems:   Diabetes mellitus without complication   Bacteremia due to Enterococcus   Acute encephalopathy   Hypernatremia   Anemia   Fungemia   Acute low back pain      Past Medical History  Diagnosis Date  . GERD (gastroesophageal reflux disease)   . Hypertension   . BPH (benign prostatic hyperplasia)   . Diabetes mellitus without complication   . Constipation     Past Surgical History  Procedure Laterality Date  . Hip surgery    . Hernia repair       Discharge Condition: Stable   Follow UP  Follow-up Information   Follow up with Cliffton Asters, MD. Schedule an appointment as soon as possible for a visit in 1 week.   Specialty:  Infectious Diseases   Contact information:   [address] [place] [address] [phone]       Follow up with Aura Camps A, MD. Schedule an appointment as soon as possible for a visit in 1 week. (fungemia)    Specialty:  Ophthalmology   Contact information:   [address] [place] [address] [phone]       Follow up with [facility]. Schedule an appointment as soon as possible for a visit in 1 week.   Contact information:   [address] [place] [address] [place] [address] [phone]        Discharge Instructions  and  Discharge  Medications          Discharge Orders   Future Orders Complete By Expires   Diet - low sodium heart healthy  As directed    Discharge instructions  As directed    Comments:     Follow with Primary MD  in 7 days,  follow repeat Blood culture results  Get CBC, CMP, checked 7 days by Primary MD and again as instructed by your Primary MD.    Activity: As tolerated with Full fall precautions use walker/cane & assistance as needed   Disposition Home     Diet: Heart Healthy dysphagia 2 with honey thick liquids, feeding assistance and aspiration precautions if needed.  For Heart failure patients - Check your Weight same time everyday, if you gain over 2 pounds, or you develop in leg swelling, experience more shortness of breath or chest pain, call your Primary MD immediately. Follow Cardiac Low Salt Diet and 1.8 lit/day fluid restriction.   On your next visit with her primary care physician  please Get Medicines reviewed and adjusted.  Please request your Prim.MD to go over all Hospital Tests and Procedure/Radiological results at the follow up, please get all Hospital records sent to your Prim MD by signing hospital release before you go home.   If you experience worsening of your admission symptoms, develop shortness of breath, life threatening emergency, suicidal or homicidal thoughts you must seek medical attention immediately by calling 911 or calling your MD immediately  if symptoms less severe.  You Must read complete instructions/literature along with all the possible adverse reactions/side effects for all the Medicines you take and that have been prescribed to you. Take any new Medicines after you have completely understood and accpet all the possible adverse reactions/side effects.   Do not drive and provide baby sitting services if your were admitted for syncope or siezures until you have seen by Primary MD or a Neurologist and advised to do so again.  Do not drive when taking  Pain medications.    Do not take more than prescribed Pain, Sleep and Anxiety Medications  Special Instructions: If you have smoked or chewed Tobacco  in the last 2 yrs please stop smoking, stop any regular Alcohol  and or any Recreational drug use.  Wear Seat belts while driving.   Please note  You were cared for by a hospitalist during your hospital stay. If you have any questions about your discharge medications or the care you received while you were in the hospital after you are discharged, you can call the unit and asked to speak with the hospitalist on call if the hospitalist that took care of you is not available. Once you are discharged, your primary care physician will handle any further medical issues. Please note that NO REFILLS for any discharge medications will be authorized once you are discharged, as it is imperative that you return to your primary care physician (or establish a relationship with a primary care physician if you do not have one) for your aftercare needs so that they can reassess your need for medications and monitor your lab values.   Increase activity slowly  As directed        Medication List    STOP taking these medications       ampicillin-sulbactam 3 (2-1) G injection  Commonly known as:  UNASYN     traMADol 50 MG tablet  Commonly known as:  ULTRAM      TAKE these medications       acetaminophen 500 MG tablet  Commonly known as:  TYLENOL  Take 1,000 mg by mouth every 6 (six) hours as needed for mild pain.     albuterol (2.5 MG/3ML) 0.083% nebulizer solution  Commonly known as:  PROVENTIL  Take 2.5 mg by nebulization every 8 (eight) hours as needed for wheezing or shortness of breath.     aspirin 81 MG tablet  Take 81 mg by mouth daily.     bisacodyl 10 MG suppository  Commonly known as:  DULCOLAX  Place 10 mg rectally daily. For 9 days     carbamide peroxide 6.5 % otic solution  Commonly known as:  DEBROX  Place 5 drops into both  ears daily as needed (cerum impaction).     carvedilol 3.125 MG tablet  Commonly known as:  COREG  Take 3.125 mg by mouth 2 (two) times daily with a meal.     clotrimazole 1 % cream  Commonly known as:  LOTRIMIN  Apply 1 application topically  2 (two) times daily.     desonide 0.05 % lotion  Commonly known as:  DESOWEN  Apply 1 application topically daily. Monday - Thursday - to face and ears     feeding supplement (ENSURE) Pudg  Take 1 Container by mouth 3 (three) times daily between meals.     fluocinonide cream 0.05 %  Commonly known as:  LIDEX  Apply 1 application topically 2 (two) times daily. Monday through Thursday to legs     fluticasone 50 MCG/ACT nasal spray  Commonly known as:  FLONASE  Place 1 spray into both nostrils daily.     furosemide 20 MG tablet  Commonly known as:  LASIX  Take 20 mg by mouth every other day.     lactobacillus acidophilus Tabs tablet  Take 1 tablet by mouth 3 (three) times daily.     lisinopril 2.5 MG tablet  Commonly known as:  PRINIVIL,ZESTRIL  Take 2.5 mg by mouth daily.     mirtazapine 15 MG tablet  Commonly known as:  REMERON  Take 0.5 tablets (7.5 mg total) by mouth at bedtime.     omeprazole 20 MG capsule  Commonly known as:  PRILOSEC  Take 20 mg by mouth daily.     polyethylene glycol packet  Commonly known as:  MIRALAX / GLYCOLAX  Take 17 g by mouth daily as needed for moderate constipation.     polyvinyl alcohol 1.4 % ophthalmic solution  Commonly known as:  LIQUIFILM TEARS  Place 2 drops into both eyes 3 (three) times daily.     RESOURCE THICKENUP CLEAR Powd  For thickening liquids     sennosides-docusate sodium 8.6-50 MG tablet  Commonly known as:  SENOKOT-S  Take 2 tablets by mouth 2 (two) times daily.     sodium chloride 0.9 % SOLN 100 mL with micafungin 50 MG SOLR 100 mg  Inject 100 mg into the vein daily. For 2 weeks     tamsulosin 0.4 MG Caps capsule  Commonly known as:  FLOMAX  Take 0.4 mg by mouth  daily after supper.     vitamin B-12 1000 MCG tablet  Commonly known as:  CYANOCOBALAMIN  Take 1,000 mcg by mouth daily.          Diet and Activity recommendation: See Discharge Instructions above   Consults obtained - infectious disease   Major procedures and Radiology Reports - PLEASE review detailed and final reports for all details, in brief -   New PICC line insertion on 05/14/2013   Dg Chest 1 View  05/08/2013   CLINICAL DATA:  Shortness of breath.  EXAM: CHEST - 1 VIEW  COMPARISON:  Chest x-ray 05/07/2013.  FINDINGS: There is a right upper extremity PICC with tip terminating in the right atrium. Skin fold artifact in the right hemithorax. Lung volumes are slightly low. No definite consolidative airspace disease. No pleural effusions. Diffuse peribronchial cuffing. Mild cephalization of the pulmonary vasculature with indistinct interstitial markings suggesting mild interstitial pulmonary edema. Mild cardiomegaly. The patient is rotated to the right on today's exam, resulting in distortion of the mediastinal contours and reduced diagnostic sensitivity and specificity for mediastinal pathology. Atherosclerosis in the thoracic aorta.  IMPRESSION: 1. The appearance the chest suggests mild congestive heart failure, as above. 2. Atherosclerosis. 3. Unchanged right upper extremity PICC.   Electronically Signed   By: Trudie Reed M.D.   On: 05/08/2013 11:59   Dg Chest 2 View  05/07/2013   CLINICAL DATA:  Shortness of breath.  Confusion.  EXAM: CHEST  2 VIEW  COMPARISON:  05/05/2013  FINDINGS: PICC tip is in the right atrium and could be retracted at least 2 cm.  There is new interstitial accentuation as well as a small right effusion. Pulmonary vascularity is more prominent. This probably represents slight pulmonary edema.  There is tortuosity and calcification of the thoracic aorta. No acute osseous abnormality. Severe right and moderate left arthritis of the shoulders.  IMPRESSION: New  slight interstitial pulmonary edema.   Electronically Signed   By: Geanie Cooley M.D.   On: 05/07/2013 14:27   Dg Thoracolumbar Spine  05/08/2013   CLINICAL DATA:  Pain all over.  EXAM: THORACOLUMBAR SPINE - 2 VIEW  COMPARISON:  No priors.  FINDINGS: Lateral view of the thoracolumbar spine incompletely visualizes the upper thoracic spine from T1 through T3 and the lower lumbar spine from L4 through S1. On the frontal projection, the thoracic spine is incompletely visualized from T1 through T6. With these limitations in mind, the visualized portions of the thorax the lumbar spine demonstrate no definite acute displaced fractures or compression type fractures. There is multilevel degenerative disc disease throughout the thoracolumbar spine. Multilevel facet arthropathy in the lumbar spine. Severe atherosclerosis is noted.  IMPRESSION: 1. Limited study demonstrating no definite acute radiographic abnormality of the visualized portions of the thoracolumbar spine, as discussed above.   Electronically Signed   By: Trudie Reed M.D.   On: 05/08/2013 11:55   Ct Head Wo Contrast  05/05/2013   CLINICAL DATA:  Altered mental status.  EXAM: CT HEAD WITHOUT CONTRAST  TECHNIQUE: Contiguous axial images were obtained from the base of the skull through the vertex without intravenous contrast.  COMPARISON:  None.  FINDINGS: There is atrophy and chronic small vessel disease changes. Small old lacunar infarct in the right basal ganglia. No acute intracranial abnormality. Specifically, no hemorrhage, hydrocephalus, mass lesion, acute infarction, or significant intracranial injury. No acute calvarial abnormality.  Mucosal thickening within the ethmoid air cells and nasal cavity. Mastoid air cells are clear. Orbital soft tissues are unremarkable.  IMPRESSION: No acute intracranial abnormality.  Atrophy, chronic microvascular disease.   Electronically Signed   By: Charlett Nose M.D.   On: 05/05/2013 20:27   Mr Lumbar Spine W Wo  Contrast  05/11/2013   CLINICAL DATA:  Evaluate for spinal seeding of fungal infection. Fungemia with acute low back pain. Bacteremia due to enterococcus.  EXAM: MRI LUMBAR SPINE WITHOUT AND WITH CONTRAST  TECHNIQUE: Multiplanar and multiecho pulse sequences of the lumbar spine were obtained without and with intravenous contrast.  CONTRAST:  15mL MULTIHANCE GADOBENATE DIMEGLUMINE 529 MG/ML IV SOLN  COMPARISON:  None.  FINDINGS: The patient had difficulty remaining motionless for the exam despite administration of narcotics. The study is marginally adequate to answer the clinical question although small abnormalities could be overlooked, particularly related to disc pathology and stenosis.  The alignment is anatomic except for 5 mm of facet mediated anterolisthesis L4-5. There are no worrisome osseous lesions.  The L4-5 disc is hyperintense relative to the other normal disc spaces. This is best seen on image 10 series 1000. Furthermore, there is mild enhancement noted on postcontrast images at the L4-5 disc space. No epidural fluid collection, and no evidence for vertebral body enhancement, but the findings could represent early discitis. Recommend short-term 7-10 day MRI follow-up.  Asymmetric facet joint edema is observed at L3-4 on the left. This most often is degenerative in nature. Early septic arthritis could have  this appearance, but there is no postcontrast enhancement. Therefore, suspicion is low.  There are central protrusions at L3-4 and L4-5 with mild annular bulging at L1-2, L2-3, and L5-S1. Moderate to severe stenosis is present at L3-4 and L4-5.  The bladder is distended. There is a hypointense structure within the bladder which could represent a bladder calculus. CT abdomen and pelvis could provide further information.  There is a giant right renal cyst, nearly 10 cm in diameter. There is moderate distention of both renal collecting systems which could represent hydronephrosis or extrarenal pelves.  The exam is too motion degraded to differentiate. Renal ultrasound could be helpful in further evaluation.  Advanced facet arthropathy and ligamentum flavum hypertrophy at multiple levels.  IMPRESSION: Severely motion degraded exam.  L4-5 disc hyperintensity and enhancement without associated vertebral body abnormality or epidural fluid collection. Early discitis is not excluded.  Multilevel spondylosis with severe spinal stenosis at L3-4 and L4-5.  Question bladder distention and bilateral hydronephrosis. Large right renal cyst. Consider renal ultrasound and/or CT abdomen pelvis for further evaluation.   Electronically Signed   By: Davonna Belling M.D.   On: 05/11/2013 12:29   Dg Chest Portable 1 View  05/05/2013   CLINICAL DATA:  Altered mental status since 2 o'clock today. Fever. Sepsis last week. CHF. Hypertension. Diabetes.  EXAM: PORTABLE CHEST - 1 VIEW  COMPARISON:  None.  FINDINGS: Degraded exam secondary to AP portable technique and patient chin overlying the right apex. A right-sided PICC line terminates at cavoatrial junction versus high right atrium.  Advanced glenohumeral joint osteoarthritis, primarily on the right.  Cardiomegaly accentuated by AP portable technique. No pleural fluid. No gross pneumothorax. Low lung volumes with resultant pulmonary interstitial prominence. Right greater the left bibasilar airspace disease.  IMPRESSION: Low lung volumes with right greater the left bibasilar airspace disease. Likely atelectasis. Early infection is difficult to exclude on the right.  Decreased sensitivity and specificity exam due to technique related factors, as described above.   Electronically Signed   By: Jeronimo Greaves M.D.   On: 05/05/2013 18:43    Micro Results      Recent Results (from the past 240 hour(s))  CULTURE, BLOOD (ROUTINE X 2)     Status: None   Collection Time    05/05/13  5:55 PM      Result Value Ref Range Status   Specimen Description BLOOD RIGHT ARM   Final   Special  Requests BOTTLES DRAWN AEROBIC ONLY [facility]   Final   Culture  Setup Time     Final   Value: 05/05/2013 23:31     Performed at [facility]   Culture     Final   Value: CANDIDA GLABRATA     Note: Gram Stain Report Called to,Read Back By and Verified With: EMMANUEL CASTRO ON 05/08/2013 AT 9:25P BY WILEJ     Performed at [facility]   Report Status 05/12/2013 FINAL   Final  CULTURE, BLOOD (ROUTINE X 2)     Status: None   Collection Time    05/05/13  6:00 PM      Result Value Ref Range Status   Specimen Description BLOOD RIGHT HAND   Final   Special Requests BOTTLES DRAWN AEROBIC AND ANAEROBIC 5CC   Final   Culture  Setup Time     Final   Value: 05/05/2013 23:31     Performed at [facility]   Culture     Final   Value:  NO GROWTH 5 DAYS     Performed at [facility]   Report Status 05/11/2013 FINAL   Final  URINE CULTURE     Status: None   Collection Time    05/05/13  7:00 PM      Result Value Ref Range Status   Specimen Description URINE, CATHETERIZED   Final   Special Requests NONE   Final   Culture  Setup Time     Final   Value: 05/05/2013 23:32     Performed at [facility]   Colony Count     Final   Value: NO GROWTH     Performed at [facility]   Culture     Final   Value: NO GROWTH     Performed at [facility]   Report Status 05/06/2013 FINAL   Final  MRSA PCR SCREENING     Status: None   Collection Time    05/06/13  5:01 AM      Result Value Ref Range Status   MRSA by PCR NEGATIVE  NEGATIVE Final   Comment:            The GeneXpert MRSA Assay (FDA     approved for NASAL specimens     only), is one component of a     comprehensive MRSA colonization     surveillance program. It is not     intended to diagnose MRSA     infection nor to guide or     monitor treatment for     MRSA infections.  CULTURE, BLOOD (ROUTINE X 2)     Status: None   Collection Time    05/09/13  9:55 AM      Result Value Ref Range  Status   Specimen Description BLOOD HAND RIGHT   Final   Special Requests BOTTLES DRAWN AEROBIC ONLY 10CC   Final   Culture  Setup Time     Final   Value: 05/09/2013 14:36     Performed at [facility]   Culture     Final   Value: CANDIDA GLABRATA     Note: Gram Stain Report Called to,Read Back By and Verified With: ALBERT RIO 05/12/13 @ 5:02PM BY RUSCOE A.     Performed at [facility]   Report Status 05/15/2013 FINAL   Final  CULTURE, BLOOD (ROUTINE X 2)     Status: None   Collection Time    05/09/13 10:00 AM      Result Value Ref Range Status   Specimen Description BLOOD LEFT ANTECUBITAL   Final   Special Requests BOTTLES DRAWN AEROBIC ONLY 5CC   Final   Culture  Setup Time     Final   Value: 05/09/2013 14:36     Performed at [facility]   Culture     Final   Value: NO GROWTH 5 DAYS     Performed at [facility]   Report Status 05/15/2013 FINAL   Final  CULTURE, BLOOD (ROUTINE X 2)     Status: None   Collection Time    05/13/13 12:45 PM      Result Value Ref Range Status   Specimen Description BLOOD RIGHT HAND   Final   Special Requests BOTTLES DRAWN AEROBIC AND ANAEROBIC 3CC   Final   Culture  Setup Time     Final   Value: 05/13/2013 16:16     Performed at [facility]  Culture     Final   Value:        BLOOD CULTURE RECEIVED NO GROWTH TO DATE CULTURE WILL BE HELD FOR 5 DAYS BEFORE ISSUING A FINAL NEGATIVE REPORT     Performed at [facility]   Report Status PENDING   Incomplete  CULTURE, BLOOD (ROUTINE X 2)     Status: None   Collection Time    05/13/13  1:45 PM      Result Value Ref Range Status   Specimen Description BLOOD RIGHT ARM   Final   Special Requests BOTTLES DRAWN AEROBIC AND ANAEROBIC 10CC   Final   Culture  Setup Time     Final   Value: 05/13/2013 16:47     Performed at [facility]   Culture     Final   Value:        BLOOD CULTURE RECEIVED NO GROWTH TO DATE CULTURE WILL BE HELD FOR 5 DAYS BEFORE  ISSUING A FINAL NEGATIVE REPORT     Performed at [facility]   Report Status PENDING   Incomplete  CLOSTRIDIUM DIFFICILE BY PCR     Status: None   Collection Time    05/14/13 10:44 AM      Result Value Ref Range Status   C difficile by pcr NEGATIVE  NEGATIVE Final     History of present illness and  Hospital Course:     Kindly see H&P for history of present illness and admission details, please review complete Labs, Consult reports and Test reports for all details in brief Riley Martinez, is a 80 y.o. male, patient with history of hypertension, BPH, GERD, diabetes mellitus type 2 on diet control, who was recently admitted at [facility] with sepsis secondary to enterococcus UTI and bacteremia, at that time patient also was found to have decompensated CHF and was eventually discharged to rehabilitation. patient was found to be unresponsive and was brought to the ER. Patient in the ER and CT head which was unremarkable. Labs revealed hypernatremia and hypokalemia with leukocytosis and anemia. Chest x-ray shows possible infiltrates and patient was found to be febrile. He was admitted by medical service and is managed for acute encephalopathy.    He finished his antibiotics for pneumonia and enterococcal UTI with enterococcal bacteremia, he now has fungemia likely from long-standing PICC line which has been removed. He had back pain which has now resolved. Has been seen by ID. new PICC line was placed on 05/14/2013, repeat blood cultures so far are negative, he will be continuing IV micafungin for 2 weeks from first negative blood cultures. He will follow up with infectious disease in the office in 7-10 days time frame.    His acute encephalopathy which was secondary to healthcare associated pneumonia/enterococcal UTI with bacteremia has resolved and his mental status is now close to baseline.    His hypernatremia which was from dehydration has resolved after IV fluids, he is  back on his home dose Lasix, continue monitoring BMP on intermittent basis. Low potassium today was replaced.    History of chronic systolic CHF with EF of 30-35%. He was dehydrated upon admission, now compensated and been resumed at home dose read an ACE inhibitor.    Patient did develop some urinary retention, he is on Flomax, Foley was placed, bladder rest was provided for 3 days, thereafter Foley was removed however he developed retention soon thereafter Foley had to be replaced. He will be discharged with Foley and  Flomax, outpatient urology followup in 7-10 days.    Plan discussed with patient and son both agreeable.      Today   Subjective:   Riley Martinez today has no headache,no chest abdominal pain,no new weakness tingling or numbness, feels much better.  Objective:   Blood pressure 140/76, pulse 80, temperature 97.9 F (36.6 C), temperature source Axillary, resp. rate 20, height 5\' 4"  (1.626 m), weight 167 lb 6 oz (75.921 kg), SpO2 93.00%.   Intake/Output Summary (Last 24 hours) at 05/15/13 1536 Last data filed at 05/15/13 1300  Gross per 24 hour  Intake    660 ml  Output   1595 ml  Net   -935 ml    Exam Awake Alert, Oriented *3, No new F.N deficits, Normal affect Riley Martinez,PERRAL Supple Neck,No JVD, No cervical lymphadenopathy appriciated.  Symmetrical Chest wall movement, Good air movement bilaterally, CTAB RRR,No Gallops,Rubs or new Murmurs, No Parasternal Heave +ve B.Sounds, Abd Soft, Non tender, No organomegaly appriciated, No rebound -guarding or rigidity. No Cyanosis, Clubbing or edema, No new Rash or bruise  Data Review   CBC w Diff: Lab Results  Component Value Date   WBC 8.9 05/14/2013   HGB 9.3* 05/14/2013   HCT 30.4* 05/14/2013   PLT 265 05/14/2013   LYMPHOPCT 12 05/06/2013   MONOPCT 8 05/06/2013   EOSPCT 4 05/06/2013   BASOPCT 0 05/06/2013    CMP: Lab Results  Component Value Date   NA 146 05/14/2013   K 3.5* 05/14/2013   CL 107 05/14/2013    CO2 27 05/14/2013   BUN 11 05/14/2013   CREATININE 0.91 05/14/2013   PROT 6.2 05/06/2013   ALBUMIN 2.3* 05/06/2013   BILITOT 0.8 05/06/2013   ALKPHOS 32* 05/06/2013   AST 11 05/06/2013   ALT 14 05/06/2013  .   Total Time in preparing paper work, data evaluation and todays exam - 35 minutes  Leroy Sea M.D on 05/14/2013 at 3:36 PM  [facility] Office  [phone]  UPDATE: I saw Mr. Age on day of discharge.  Dr. Thedore Mins completed the majority of the above summary.  Mr. Eleazer will need 2 weeks of IV Micafungan from date of last negative blood culture.  His last BC was 05/13/13 which are NGTD (48 hours).  If these remain negative, patient will need IV micafungan until 05/27/13.  This is the only update of note to the D/C summary.    Debe Coder, MD

## 2013-05-14 NOTE — Progress Notes (Signed)
Bladder scan shows 181cc. No complaints. Will continue to monitor.

## 2013-05-15 LAB — GLUCOSE, CAPILLARY
Glucose-Capillary: 126 mg/dL — ABNORMAL HIGH (ref 70–99)
Glucose-Capillary: 120 mg/dL — ABNORMAL HIGH (ref 70–99)
Glucose-Capillary: 127 mg/dL — ABNORMAL HIGH (ref 70–99)

## 2013-05-15 LAB — CULTURE, BLOOD (ROUTINE X 2): Culture: NO GROWTH

## 2013-05-15 NOTE — Progress Notes (Signed)
Patient discharged to [facility].  Family notified of transfer.  Patient belongings gathered.  Patient to be transferred with PICC line and Foley catheter.  Report called to nurse at [facility].  EMS ride arranged.  Patient transferred off unit via stretcher with EMS.  Son called to notify of transfer.

## 2013-05-15 NOTE — Progress Notes (Signed)
Brief note: patient due for discharge today, please see Dr. Doristine ChurchSingh's discharge summary.  Patient resting when seen.  No acute events or complaints overnight.  PICC in RUE appears clean and dry.

## 2013-05-15 NOTE — Clinical Social Work Note (Signed)
[facility] today to continue his rehabilitation.  CSW facilitated transport via ambulance. Son at bedside and aware of today's discharge.  Genelle BalVanessa Shariff Lasky, MSW, LCSW [phone]

## 2013-05-16 ENCOUNTER — Encounter: Payer: Self-pay | Admitting: Internal Medicine

## 2013-05-16 ENCOUNTER — Non-Acute Institutional Stay (SKILLED_NURSING_FACILITY): Payer: Medicare Other | Admitting: Internal Medicine

## 2013-05-16 DIAGNOSIS — B49 Unspecified mycosis: Secondary | ICD-10-CM

## 2013-05-16 DIAGNOSIS — I10 Essential (primary) hypertension: Secondary | ICD-10-CM

## 2013-05-16 DIAGNOSIS — I5022 Chronic systolic (congestive) heart failure: Secondary | ICD-10-CM

## 2013-05-16 DIAGNOSIS — E870 Hyperosmolality and hypernatremia: Secondary | ICD-10-CM

## 2013-05-16 DIAGNOSIS — G9340 Encephalopathy, unspecified: Secondary | ICD-10-CM

## 2013-05-16 DIAGNOSIS — N400 Enlarged prostate without lower urinary tract symptoms: Secondary | ICD-10-CM

## 2013-05-16 DIAGNOSIS — Z95828 Presence of other vascular implants and grafts: Secondary | ICD-10-CM

## 2013-05-16 DIAGNOSIS — Z9889 Other specified postprocedural states: Secondary | ICD-10-CM

## 2013-05-16 DIAGNOSIS — I509 Heart failure, unspecified: Secondary | ICD-10-CM

## 2013-05-16 NOTE — Assessment & Plan Note (Signed)
Sec to enterococcus UTI and bacteremia, then HCAP, treated.

## 2013-05-16 NOTE — Progress Notes (Signed)
This encounter was created in error - please disregard.

## 2013-05-16 NOTE — Assessment & Plan Note (Signed)
With urinary retention requiring foley until f/u with urology;continue flomax

## 2013-05-16 NOTE — Assessment & Plan Note (Signed)
prob from sepsis, dehydration from Lasix and poor po intake-resolved

## 2013-05-16 NOTE — Assessment & Plan Note (Signed)
Was getting better from HCAP and UTI sepsis then fungemia occurred , prob 2/2 long standing PICC loine;PICC replaced 2/17 and will be on antifungal for 2 weeks [facility] so antifungal until 2/28 but note says 3/2 as stop date so 3/2 will be stop date

## 2013-05-16 NOTE — Assessment & Plan Note (Signed)
EF 30-35%; after rehydration resumed ACE, coreg and lasix QOD

## 2013-05-16 NOTE — Assessment & Plan Note (Signed)
Treatment of BP and CHF concomitant with same drugs

## 2013-05-16 NOTE — Progress Notes (Signed)
MRN: [id number] Name: Riley Martinez  Sex: male Age: 80 y.o. DOB: [birth year]  [facility]  Facility/Room: 115 Level Of Care: SNF Provider: Merrilee SeashoreALEXANDER, Aryan Bello D Emergency Contacts: Extended Emergency Contact Information Primary Emergency Contact: Dechert,[name] Address: [address]          [place], [place] [address] [place] Home Phone: [phone] Relation: Son Secondary Emergency Contact: Nicholas LoseMandrano,Mark Address: [address]           [place], [place] [address] [place] Home Phone: [phone] Relation: Son  Code Status: undecided  Allergies: Ativan  Chief Complaint  Patient presents with  . nursing home admission    HPI: Patient is 80 y.o. male who is being admitted for supportive care and PT/OT after urosepsis, HCAP and currently fungemia, still being treated.  Past Medical History  Diagnosis Date  . GERD (gastroesophageal reflux disease)   . Hypertension   . BPH (benign prostatic hyperplasia)   . Diabetes mellitus without complication   . Constipation     Past Surgical History  Procedure Laterality Date  . Hip surgery    . Hernia repair        Medication List       This list is accurate as of: 05/16/13  8:42 PM.  Always use your most recent med list.               acetaminophen 500 MG tablet  Commonly known as:  TYLENOL  Take 1,000 mg by mouth every 6 (six) hours as needed for mild pain.     albuterol (2.5 MG/3ML) 0.083% nebulizer solution  Commonly known as:  PROVENTIL  Take 2.5 mg by nebulization every 8 (eight) hours as needed for wheezing or shortness of breath.     aspirin 81 MG tablet  Take 81 mg by mouth daily.     bisacodyl 10 MG suppository  Commonly known as:  DULCOLAX  Place 10 mg rectally daily. For 9 days     carbamide peroxide 6.5 % otic solution  Commonly known as:  DEBROX  Place 5 drops into both ears daily as needed (cerum impaction).     carvedilol 3.125 MG tablet  Commonly known as:   COREG  Take 3.125 mg by mouth 2 (two) times daily with a meal.     clotrimazole 1 % cream  Commonly known as:  LOTRIMIN  Apply 1 application topically 2 (two) times daily.     desonide 0.05 % lotion  Commonly known as:  DESOWEN  Apply 1 application topically daily. Monday - Thursday - to face and ears     feeding supplement (ENSURE) Pudg  Take 1 Container by mouth 3 (three) times daily between meals.     fluocinonide cream 0.05 %  Commonly known as:  LIDEX  Apply 1 application topically 2 (two) times daily. Monday through Thursday to legs     fluticasone 50 MCG/ACT nasal spray  Commonly known as:  FLONASE  Place 1 spray into both nostrils daily.     furosemide 20 MG tablet  Commonly known as:  LASIX  Take 20 mg by mouth every other day.     lactobacillus acidophilus Tabs tablet  Take 1 tablet by mouth 3 (three) times daily.     lisinopril 2.5 MG tablet  Commonly known as:  PRINIVIL,ZESTRIL  Take 2.5 mg by mouth daily.     mirtazapine 15 MG tablet  Commonly known as:  REMERON  Take 0.5 tablets (7.5 mg  total) by mouth at bedtime.     omeprazole 20 MG capsule  Commonly known as:  PRILOSEC  Take 20 mg by mouth daily.     polyethylene glycol packet  Commonly known as:  MIRALAX / GLYCOLAX  Take 17 g by mouth daily as needed for moderate constipation.     polyvinyl alcohol 1.4 % ophthalmic solution  Commonly known as:  LIQUIFILM TEARS  Place 2 drops into both eyes 3 (three) times daily.     RESOURCE THICKENUP CLEAR Powd  For thickening liquids     sennosides-docusate sodium 8.6-50 MG tablet  Commonly known as:  SENOKOT-S  Take 2 tablets by mouth 2 (two) times daily.     sodium chloride 0.9 % SOLN 100 mL with micafungin 50 MG SOLR 100 mg  Inject 100 mg into the vein daily. For 2 weeks     tamsulosin 0.4 MG Caps capsule  Commonly known as:  FLOMAX  Take 0.4 mg by mouth daily after supper.     vitamin B-12 1000 MCG tablet  Commonly known as:  CYANOCOBALAMIN   Take 1,000 mcg by mouth daily.        No orders of the defined types were placed in this encounter.     There is no immunization history on file for this patient.  History  Substance Use Topics  . Smoking status: Former Games developer  . Smokeless tobacco: Not on file  . Alcohol Use: Yes     Comment: occasionally has not had for years.    Family history is noncontributory    Review of Systems  DATA OBTAINED: from patient GENERAL: Feels well no fevers, fatigue, appetite changes SKIN: No itching, rash or wounds EYES: No eye pain, redness, discharge EARS: No earache, tinnitus, change in hearing NOSE: No congestion, drainage or bleeding  MOUTH/THROAT: No mouth or tooth pain, No sore throat, No difficulty chewing or swallowing  RESPIRATORY: No cough, wheezing, SOB CARDIAC: No chest pain, palpitations, lower extremity edema  GI: No abdominal pain, No N/V/D or constipation, No heartburn or reflux  GU: No dysuria, frequency or urgency, or incontinence  MUSCULOSKELETAL: No unrelieved bone/joint pain NEUROLOGIC: No headache, dizziness or focal weakness PSYCHIATRIC: No overt anxiety or sadness. Sleeps well. No behavior issue.   Filed Vitals:   05/16/13 2041  BP: 134/56  Pulse: 63  Temp: 97.6 F (36.4 C)  Resp: 18    Physical Exam  GENERAL APPEARANCE: Alert, conversant. Appropriately groomed. No acute distress.  SKIN: No diaphoresis rash HEAD: Normocephalic, atraumatic  EYES: Conjunctiva/lids clear. Pupils round, reactive. EOMs intact.  EARS: External exam WNL, canals clear. Hearing grossly normal.  NOSE: No deformity or discharge.  MOUTH/THROAT: Lips w/o lesions. RESPIRATORY: Breathing is even, unlabored. Lung sounds are clear   CARDIOVASCULAR: Heart RRR no murmurs, rubs or gallops. No peripheral edema.  GASTROINTESTINAL: Abdomen is soft, non-tender, not distended w/ normal bowel sounds GENITOURINARY: Bladder non tender, not distended  MUSCULOSKELETAL: No abnormal joints or  musculature NEUROLOGIC: Oriented X3. Cranial nerves 2-12 grossly intact. Moves all extremities no tremor. PSYCHIATRIC: Mood and affect appropriate to situation, no behavioral issues  Patient Active Problem List   Diagnosis Date Noted  . Chronic systolic CHF (congestive heart failure) 05/16/2013  . Status post PICC central line placement 05/16/2013  . Fungemia 05/09/2013  . Acute low back pain 05/09/2013  . Acute encephalopathy 05/05/2013  . Hypernatremia 05/05/2013  . Fever 05/05/2013  . Anemia 05/05/2013  . Influenza 05/03/2013  . Dysphagia 05/03/2013  .  Bacteremia due to Enterococcus 05/03/2013  . GERD (gastroesophageal reflux disease)   . Hypertension   . BPH (benign prostatic hyperplasia)   . Diabetes mellitus without complication   . Constipation     CBC    Component Value Date/Time   WBC 8.9 05/14/2013 0350   RBC 4.53 05/14/2013 0350   HGB 9.3* 05/14/2013 0350   HCT 30.4* 05/14/2013 0350   PLT 265 05/14/2013 0350   MCV 67.1* 05/14/2013 0350   LYMPHSABS 1.0 05/06/2013 0430   MONOABS 0.7 05/06/2013 0430   EOSABS 0.3 05/06/2013 0430   BASOSABS 0.0 05/06/2013 0430    CMP     Component Value Date/Time   NA 146 05/14/2013 0350   K 3.5* 05/14/2013 0350   CL 107 05/14/2013 0350   CO2 27 05/14/2013 0350   GLUCOSE 103* 05/14/2013 0350   BUN 11 05/14/2013 0350   CREATININE 0.91 05/14/2013 0350   CALCIUM 8.4 05/14/2013 0350   PROT 6.2 05/06/2013 0430   ALBUMIN 2.3* 05/06/2013 0430   AST 11 05/06/2013 0430   ALT 14 05/06/2013 0430   ALKPHOS 32* 05/06/2013 0430   BILITOT 0.8 05/06/2013 0430   GFRNONAA 69* 05/14/2013 0350   GFRAA 80* 05/14/2013 0350    Assessment and Plan  Acute encephalopathy Sec to enterococcus UTI and bacteremia, then HCAP, treated.  Hypernatremia prob from sepsis, dehydration from Lasix and poor po intake-resolved  Chronic systolic CHF (congestive heart failure) EF 30-35%; after rehydration resumed ACE, coreg and lasix QOD  Hypertension Treatment of BP and CHF  concomitant with same drugs  BPH (benign prostatic hyperplasia) With urinary retention requiring foley until f/u with urology;continue flomax  Fungemia Was getting better from HCAP and UTI sepsis then fungemia occurred , prob 2/2 long standing PICC loine;PICC replaced 2/17 and will be on antifungal for 2 weeks from first neg [facility] which looks to be 2/16 so antifungal until 2/28 but note says 3/2 as stop date so 3/2 will be stop date    Margit Hanks, MD

## 2013-05-18 ENCOUNTER — Telehealth: Payer: Self-pay | Admitting: Infectious Diseases

## 2013-05-18 NOTE — Telephone Encounter (Signed)
Spoke with son at length, regarding his dad's length of therapy and his anorexia. I suggested that he contact his primary care MD at SNF regarding dietary supplement, stimulant.  I let him know that his repeat BCx are (-).  I let him know that Dr Orvan Falconerampbell will communicate with [facility] regarding when to stop his anti-fungal.   [phone] Artis FlockJohn Mondrano.

## 2013-05-19 LAB — CULTURE, BLOOD (ROUTINE X 2)
CULTURE: NO GROWTH
Culture: NO GROWTH

## 2013-05-20 NOTE — Telephone Encounter (Signed)
Repeat BCs on 2/16 were negative. He needs 2 weeks of micafungin starting on the day of negative BCs so he needs to continue it until 05/27/2013. I have communicated that to Dr. Lyn HollingsheadAlexander at his SNF. Please let his son know my recommendation.

## 2013-05-20 NOTE — Telephone Encounter (Signed)
Patient's son called stating his Father's appetite is not good on the antibiotics and  would like to know if the IV antibiotics could end around the end of the month 2/26 if possible. Please advise

## 2013-05-30 ENCOUNTER — Encounter: Payer: Self-pay | Admitting: Infectious Disease

## 2013-05-30 ENCOUNTER — Ambulatory Visit (INDEPENDENT_AMBULATORY_CARE_PROVIDER_SITE_OTHER): Payer: Medicare Other | Admitting: Infectious Disease

## 2013-05-30 VITALS — BP 122/70 | HR 79 | Temp 98.1°F

## 2013-05-30 DIAGNOSIS — F5000 Anorexia nervosa, unspecified: Secondary | ICD-10-CM

## 2013-05-30 DIAGNOSIS — F0390 Unspecified dementia without behavioral disturbance: Secondary | ICD-10-CM

## 2013-05-30 DIAGNOSIS — B379 Candidiasis, unspecified: Secondary | ICD-10-CM

## 2013-05-30 DIAGNOSIS — B49 Unspecified mycosis: Secondary | ICD-10-CM

## 2013-05-30 DIAGNOSIS — J690 Pneumonitis due to inhalation of food and vomit: Secondary | ICD-10-CM

## 2013-05-30 DIAGNOSIS — R630 Anorexia: Secondary | ICD-10-CM

## 2013-05-30 NOTE — Progress Notes (Signed)
   Subjective:    Patient ID: Riley Martinez, male    DOB: 02/02/1915, 80 y.o.   MRN: [id number]  HPI  80 year old with Dementia, dysphagia, DM, recent bacteremia then c. Glabrata fungemia. He cleared bloodstream and had PICC line removed. He had fundoscopic exam that ruled out eye involvement. He has had more t han 2 weeks of post blood clearance echinocandin therapy.  He is feeling better and much more alert. He is still coughing vigorously when drinking liquids and is very thristy.   Review of Systems  Constitutional: Positive for appetite change and fatigue. Negative for fever and diaphoresis.  HENT: Positive for trouble swallowing.   Respiratory: Positive for cough. Negative for chest tightness.   Gastrointestinal: Negative for nausea, vomiting, abdominal pain and diarrhea.  Genitourinary: Negative for dysuria, hematuria and difficulty urinating.  Skin: Negative for color change, pallor, rash and wound.  Neurological: Positive for weakness. Negative for light-headedness.  Psychiatric/Behavioral: Positive for confusion. Negative for behavioral problems, sleep disturbance and agitation.       Objective:   Physical Exam  Constitutional: No distress.  HENT:  Head: Normocephalic and atraumatic.  Mouth/Throat: Oropharynx is clear and moist. No oropharyngeal exudate.  Eyes: Conjunctivae and EOM are normal. No scleral icterus.  Neck: Normal range of motion. Neck supple.  Cardiovascular: Normal rate, regular rhythm and normal heart sounds.  Exam reveals no gallop and no friction rub.   No murmur heard. Pulmonary/Chest: Effort normal. No respiratory distress. He has decreased breath sounds in the right lower field and the left lower field. He has no wheezes.  Abdominal: Soft. He exhibits no distension. There is no tenderness.  Neurological: He is alert.  Skin: Skin is warm and dry. He is not diaphoretic. No erythema.          Assessment & Plan:   C. Glabrata fungemia:  --DC  PICC --ask SNF to check blood cultures in 2 weeks from date of stopping echinocandin and fax to us at [phone] I spent greater than  25  minutes with the patient including greater than 50% of time in face to face counsel of the patient and in coordination of their care.    Aspiration: likely to continue to be a problem  Appetite: son asked for advise and I suggested that appetite stimulanat could be contemplated

## 2013-06-03 ENCOUNTER — Non-Acute Institutional Stay (SKILLED_NURSING_FACILITY): Payer: Medicare Other | Admitting: Internal Medicine

## 2013-06-03 ENCOUNTER — Encounter: Payer: Self-pay | Admitting: Internal Medicine

## 2013-06-03 DIAGNOSIS — B49 Unspecified mycosis: Secondary | ICD-10-CM

## 2013-06-03 DIAGNOSIS — R627 Adult failure to thrive: Secondary | ICD-10-CM

## 2013-06-03 DIAGNOSIS — E870 Hyperosmolality and hypernatremia: Secondary | ICD-10-CM

## 2013-06-03 DIAGNOSIS — D649 Anemia, unspecified: Secondary | ICD-10-CM

## 2013-06-03 NOTE — Progress Notes (Signed)
MRN: [id number] Name: Riley Martinez  Sex: male Age: 80 y.o. DOB: 1914/06/16  [facility] Facility/Room: 115 Level Of Care: SNF Provider: Merrilee Seashore D Emergency Contacts: Extended Emergency Contact Information Primary Emergency Contact: Blann,[name] Address: [address]          [place], [place] [address] [place] Home Phone: [phone] Relation: Son Secondary Emergency Contact: [facility] Address: [address]           [place], [place] [address] [place] Home Phone: [phone] Relation: Son  Code Status: DNR  Allergies: Ativan  Chief Complaint  Patient presents with  . Acute Visit    HPI: Patient is 80 y.o. male who is being followed up on for recent hypernatremia, fungemia, anemia and failure to thrive.  Past Medical History  Diagnosis Date  . GERD (gastroesophageal reflux disease)   . Hypertension   . BPH (benign prostatic hyperplasia)   . Diabetes mellitus without complication   . Constipation     Past Surgical History  Procedure Laterality Date  . Hip surgery    . Hernia repair        Medication List       This list is accurate as of: 06/03/13 10:18 PM.  Always use your most recent med list.               acetaminophen 500 MG tablet  Commonly known as:  TYLENOL  Take 1,000 mg by mouth every 6 (six) hours as needed for mild pain.     albuterol (2.5 MG/3ML) 0.083% nebulizer solution  Commonly known as:  PROVENTIL  Take 2.5 mg by nebulization every 8 (eight) hours as needed for wheezing or shortness of breath.     aspirin 81 MG tablet  Take 81 mg by mouth daily.     bisacodyl 10 MG suppository  Commonly known as:  DULCOLAX  Place 10 mg rectally daily. For 9 days     carbamide peroxide 6.5 % otic solution  Commonly known as:  DEBROX  Place 5 drops into both ears daily as needed (cerum impaction).     carvedilol 3.125 MG tablet  Commonly known as:  COREG  Take 3.125 mg by mouth 2 (two) times  daily with a meal.     clotrimazole 1 % cream  Commonly known as:  LOTRIMIN  Apply 1 application topically 2 (two) times daily.     desonide 0.05 % lotion  Commonly known as:  DESOWEN  Apply 1 application topically daily. Monday - Thursday - to face and ears     feeding supplement (ENSURE) Pudg  Take 1 Container by mouth 3 (three) times daily between meals.     fluocinonide cream 0.05 %  Commonly known as:  LIDEX  Apply 1 application topically 2 (two) times daily. Monday through Thursday to legs     fluticasone 50 MCG/ACT nasal spray  Commonly known as:  FLONASE  Place 1 spray into both nostrils daily.     furosemide 20 MG tablet  Commonly known as:  LASIX  Take 20 mg by mouth every other day.     lactobacillus acidophilus Tabs tablet  Take 1 tablet by mouth 3 (three) times daily.     lisinopril 2.5 MG tablet  Commonly known as:  PRINIVIL,ZESTRIL  Take 2.5 mg by mouth daily.     mirtazapine 15 MG tablet  Commonly known as:  REMERON  Take 0.5 tablets (7.5 mg total) by mouth at bedtime.  omeprazole 20 MG capsule  Commonly known as:  PRILOSEC  Take 20 mg by mouth daily.     polyethylene glycol packet  Commonly known as:  MIRALAX / GLYCOLAX  Take 17 g by mouth daily as needed for moderate constipation.     polyvinyl alcohol 1.4 % ophthalmic solution  Commonly known as:  LIQUIFILM TEARS  Place 2 drops into both eyes 3 (three) times daily.     RESOURCE THICKENUP CLEAR Powd  For thickening liquids     sennosides-docusate sodium 8.6-50 MG tablet  Commonly known as:  SENOKOT-S  Take 2 tablets by mouth 2 (two) times daily.     sodium chloride 0.9 % SOLN 100 mL with micafungin 50 MG SOLR 100 mg  Inject 100 mg into the vein daily. For 2 weeks     tamsulosin 0.4 MG Caps capsule  Commonly known as:  FLOMAX  Take 0.4 mg by mouth daily after supper.     vitamin B-12 1000 MCG tablet  Commonly known as:  CYANOCOBALAMIN  Take 1,000 mcg by mouth daily.        No  orders of the defined types were placed in this encounter.     There is no immunization history on file for this patient.  History  Substance Use Topics  . Smoking status: Former Games developermoker  . Smokeless tobacco: Never Used  . Alcohol Use: No     Comment: occasionally has not had for years.    Review of Systems  DATA OBTAINED: from nurse, medical record; UTO from pt but nurses did say that while generally he is not eating this weekend when his younger son visited he drank and talked with him GENERAL:l no fevers, fatigue, poor appetite  SKIN: No itching, rash HEENT: No complaint RESPIRATORY: No cough, wheezing, SOB CARDIAC: No chest pain, palpitations, lower extremity edema  GI: No abdominal pain, No N/V/D or constipation, No heartburn or reflux  GU: No dysuria, frequency or urgency, or incontinence  MUSCULOSKELETAL: No unrelieved bone/joint pain NEUROLOGIC: No headache, dizziness or focal weakness PSYCHIATRIC: FTT   Filed Vitals:   06/03/13 2202  BP: 114/75  Pulse: 95  Temp: 97.8 F (36.6 C)  Resp: 18    Physical Exam  GENERAL APPEARANCE:  No acute distress , did not speak to me SKIN: No diaphoresis rash, or wounds HEENT: Unremarkable RESPIRATORY: Breathing is even, unlabored. Lung sounds are clear   CARDIOVASCULAR: Heart RRR no murmurs, rubs or gallops. No peripheral edema  GASTROINTESTINAL: Abdomen is soft, non-tender, not distended w/ normal bowel sounds.  GENITOURINARY: Bladder non tender, not distended  MUSCULOSKELETAL: No abnormal joints or musculature NEUROLOGIC: Cranial nerves 2-12 grossly intact. Moves all extremities  PSYCHIATRIC:  no behavioral issues  Patient Active Problem List   Diagnosis Date Noted  . FTT (failure to thrive) in adult 06/03/2013  . Chronic systolic CHF (congestive heart failure) 05/16/2013  . Status post PICC central line placement 05/16/2013  . Fungemia 05/09/2013  . Acute low back pain 05/09/2013  . Acute encephalopathy 05/05/2013   . Hypernatremia 05/05/2013  . Fever 05/05/2013  . Anemia 05/05/2013  . Influenza 05/03/2013  . Dysphagia 05/03/2013  . Bacteremia due to Enterococcus 05/03/2013  . GERD (gastroesophageal reflux disease)   . Hypertension   . BPH (benign prostatic hyperplasia)   . Diabetes mellitus without complication   . Constipation     CBC    Component Value Date/Time   WBC 8.9 05/14/2013 0350   RBC 4.53 05/14/2013 0350  HGB 9.3* 05/14/2013 0350   HCT 30.4* 05/14/2013 0350   PLT 265 05/14/2013 0350   MCV 67.1* 05/14/2013 0350   LYMPHSABS 1.0 05/06/2013 0430   MONOABS 0.7 05/06/2013 0430   EOSABS 0.3 05/06/2013 0430   BASOSABS 0.0 05/06/2013 0430    CMP     Component Value Date/Time   NA 146 05/14/2013 0350   K 3.5* 05/14/2013 0350   CL 107 05/14/2013 0350   CO2 27 05/14/2013 0350   GLUCOSE 103* 05/14/2013 0350   BUN 11 05/14/2013 0350   CREATININE 0.91 05/14/2013 0350   CALCIUM 8.4 05/14/2013 0350   PROT 6.2 05/06/2013 0430   ALBUMIN 2.3* 05/06/2013 0430   AST 11 05/06/2013 0430   ALT 14 05/06/2013 0430   ALKPHOS 32* 05/06/2013 0430   BILITOT 0.8 05/06/2013 0430   GFRNONAA 69* 05/14/2013 0350   GFRAA 80* 05/14/2013 0350    Assessment and Plan  Hypernatremia Pt's Na+ was 159 due to poor po intake; came down to 155 with IVF but these were d/c sec to pallative care discussion; pt was encouraged with po fluids q4 hours and his Na+ has come down to 145! With BUN 20 and Cr 0.78  Fungemia Pt is no longer on antifungal  Anemia H/H from 3/8 was 9.5/30.4 so pt is stable  FTT (failure to thrive) in adult Pt 80 yo, fungemia with poor po intake; pallative care consulted with the older son and have agreed no more IVF, no more abx, no PEG. Son was concerned about stimulating appetite and pt has been on remeron    Margit Hanks, MD

## 2013-06-03 NOTE — Assessment & Plan Note (Signed)
Pt 80 yo, fungemia with poor po intake; pallative care consulted with the older son and have agreed no more IVF, no more abx, no PEG. Son was concerned about stimulating appetite and pt has been on remeron

## 2013-06-03 NOTE — Assessment & Plan Note (Signed)
H/H from 3/8 was 9.5/30.4 so pt is stable

## 2013-06-03 NOTE — Assessment & Plan Note (Signed)
Pt is no longer on antifungal

## 2013-06-03 NOTE — Assessment & Plan Note (Signed)
Pt's Na+ was 159 due to poor po intake; came down to 155 with IVF but these were d/c sec to pallative care discussion; pt was encouraged with po fluids q4 hours and his Na+ has come down to 145! With BUN 20 and Cr 0.78

## 2013-06-04 ENCOUNTER — Non-Acute Institutional Stay (SKILLED_NURSING_FACILITY): Payer: Medicare Other | Admitting: Nurse Practitioner

## 2013-06-04 DIAGNOSIS — I5022 Chronic systolic (congestive) heart failure: Secondary | ICD-10-CM

## 2013-06-04 DIAGNOSIS — E871 Hypo-osmolality and hyponatremia: Secondary | ICD-10-CM

## 2013-06-04 DIAGNOSIS — N401 Enlarged prostate with lower urinary tract symptoms: Secondary | ICD-10-CM

## 2013-06-04 DIAGNOSIS — D649 Anemia, unspecified: Secondary | ICD-10-CM

## 2013-06-04 DIAGNOSIS — R1310 Dysphagia, unspecified: Secondary | ICD-10-CM

## 2013-06-04 DIAGNOSIS — N139 Obstructive and reflux uropathy, unspecified: Secondary | ICD-10-CM

## 2013-06-04 DIAGNOSIS — N138 Other obstructive and reflux uropathy: Secondary | ICD-10-CM

## 2013-06-04 DIAGNOSIS — K219 Gastro-esophageal reflux disease without esophagitis: Secondary | ICD-10-CM

## 2013-06-04 DIAGNOSIS — I509 Heart failure, unspecified: Secondary | ICD-10-CM

## 2013-06-04 DIAGNOSIS — R627 Adult failure to thrive: Secondary | ICD-10-CM

## 2013-06-04 DIAGNOSIS — I10 Essential (primary) hypertension: Secondary | ICD-10-CM

## 2013-06-04 NOTE — Progress Notes (Signed)
Patient ID: Riley Martinez, male   DOB: [birth year], 80 y.o.   MRN: [id number]    Nursing Home Location:  [place] of Service: SNF (31)  PCP: No primary provider on file.  Allergies  Allergen Reactions  . Ativan [Lorazepam] Other (See Comments)    unknown    Chief Complaint  Patient presents with  . Medical Managment of Chronic Issues    HPI:  80 year old male with a PMH of dementia, HTN, BPH (s/p TURP) GERD who has been at [facility] for rehab, is being seen today for routine follow up;  since stay at [facility] pt with FTT and hyponatremia; pt with dysphagia and refusing to take water with thicker in it. pts sodium has now improved; pallative care has been consulted and met with  the older son who have agreed no more IVF, no more abx, no PEG. Pt remains with poor appetite and PO intake and son reports Remeron was at a higher dose prior to hospitalization. Pt frequently calling out water during exam and when offered thicken water he refuses. Review of Systems:  Unable to obtain due to dementia   Past Medical History  Diagnosis Date  . GERD (gastroesophageal reflux disease)   . Hypertension   . BPH (benign prostatic hyperplasia)   . Diabetes mellitus without complication   . Constipation    Past Surgical History  Procedure Laterality Date  . Hip surgery    . Hernia repair     Social History:   reports that he has quit smoking. He has never used smokeless tobacco. He reports that he does not drink alcohol or use illicit drugs.  Family History  Problem Relation Age of Onset  . Diabetes Mellitus II Mother     Medications: Patient's Medications  New Prescriptions   No medications on file  Previous Medications   ACETAMINOPHEN (TYLENOL) 500 MG TABLET    Take 1,000 mg by mouth every 6 (six) hours as needed for mild pain.    ALBUTEROL (PROVENTIL) (2.5 MG/3ML) 0.083% NEBULIZER SOLUTION    Take 2.5 mg by nebulization every 8 (eight) hours as needed for  wheezing or shortness of breath.    ASPIRIN 81 MG TABLET    Take 81 mg by mouth daily.   BISACODYL (DULCOLAX) 10 MG SUPPOSITORY    Place 10 mg rectally daily. For 9 days   CARBAMIDE PEROXIDE (DEBROX) 6.5 % OTIC SOLUTION    Place 5 drops into both ears daily as needed (cerum impaction).    CARVEDILOL (COREG) 3.125 MG TABLET    Take 3.125 mg by mouth 2 (two) times daily with a meal.   CLOTRIMAZOLE (LOTRIMIN) 1 % CREAM    Apply 1 application topically 2 (two) times daily.   DESONIDE (DESOWEN) 0.05 % LOTION    Apply 1 application topically daily. Monday - Thursday - to face and ears   FEEDING SUPPLEMENT, ENSURE, (ENSURE) PUDG    Take 1 Container by mouth 3 (three) times daily between meals.   FLUOCINONIDE CREAM (LIDEX) 0.05 %    Apply 1 application topically 2 (two) times daily. Monday through Thursday to legs   FLUTICASONE (FLONASE) 50 MCG/ACT NASAL SPRAY    Place 1 spray into both nostrils daily.   FUROSEMIDE (LASIX) 20 MG TABLET    Take 20 mg by mouth every other day.   LACTOBACILLUS ACIDOPHILUS (BACID) TABS TABLET    Take 1 tablet by mouth 3 (three) times daily.  LISINOPRIL (PRINIVIL,ZESTRIL) 2.5 MG TABLET    Take 2.5 mg by mouth daily.   MALTODEXTRIN-XANTHAN GUM (RESOURCE THICKENUP CLEAR) POWD    For thickening liquids   MIRTAZAPINE (REMERON) 15 MG TABLET    Take 0.5 tablets (7.5 mg total) by mouth at bedtime.   OMEPRAZOLE (PRILOSEC) 20 MG CAPSULE    Take 20 mg by mouth daily.   POLYETHYLENE GLYCOL (MIRALAX / GLYCOLAX) PACKET    Take 17 g by mouth daily as needed for moderate constipation.    POLYVINYL ALCOHOL (LIQUIFILM TEARS) 1.4 % OPHTHALMIC SOLUTION    Place 2 drops into both eyes 3 (three) times daily.   SENNOSIDES-DOCUSATE SODIUM (SENOKOT-S) 8.6-50 MG TABLET    Take 2 tablets by mouth 2 (two) times daily.   SODIUM CHLORIDE 0.9 % SOLN 100 ML WITH MICAFUNGIN 50 MG SOLR 100 MG    Inject 100 mg into the vein daily. For 2 weeks   TAMSULOSIN (FLOMAX) 0.4 MG CAPS CAPSULE    Take 0.4 mg by mouth  daily after supper.    VITAMIN B-12 (CYANOCOBALAMIN) 1000 MCG TABLET    Take 1,000 mcg by mouth daily.  Modified Medications   No medications on file  Discontinued Medications   No medications on file     Physical Exam:  Filed Vitals:   06/04/13 2141  BP: 102/62  Pulse: 78  Temp: 97.8 F (36.6 C)  Resp: 20    Physical Exam  Constitutional: No distress.  Frail male in NAD  HENT:  Head: Normocephalic and atraumatic.  Right Ear: External ear normal.  Left Ear: External ear normal.  Nose: Nose normal.  Mouth/Throat: Oropharynx is clear and moist. No oropharyngeal exudate.  Eyes: Conjunctivae and EOM are normal. Pupils are equal, round, and reactive to light.  Neck: Normal range of motion. Neck supple.  Cardiovascular: Normal rate, regular rhythm and normal heart sounds.   Pulmonary/Chest: Effort normal.  Diminished throughout   Abdominal: Soft. Bowel sounds are normal. He exhibits no distension. There is no tenderness.  Musculoskeletal: He exhibits no edema and no tenderness.  Neurological: He is alert.  Skin: Skin is warm and dry.  Psychiatric:  Memory loss, does not answer questions appropriately   ;  Labs reviewed: Basic Metabolic Panel:  Recent Labs  05/08/13 0455 05/09/13 0445 05/11/13 0415 05/12/13 1017 05/14/13 0350  NA 148*  --  144  --  146  K 3.6* 4.7 3.5*  --  3.5*  CL 111  --  105  --  107  CO2 25  --  27  --  27  GLUCOSE 154*  --  124*  --  103*  BUN 22  --  12  --  11  CREATININE 1.11  --  0.87 0.73 0.91  CALCIUM 8.0*  --  8.3*  --  8.4   Liver Function Tests:  Recent Labs  05/05/13 1823 05/06/13 0430  AST 11 11  ALT 14 14  ALKPHOS 34* 32*  BILITOT 0.8 0.8  PROT 6.1 6.2  ALBUMIN 2.3* 2.3*   No results found for this basename: LIPASE, AMYLASE,  in the last 8760 hours No results found for this basename: AMMONIA,  in the last 8760 hours CBC:  Recent Labs  05/05/13 1823 05/06/13 0430 05/08/13 0455 05/11/13 0415 05/14/13 0350    WBC 8.7 8.7 7.2 6.4 8.9  NEUTROABS 7.3 6.7  --   --   --   HGB 8.1* 8.5* 7.8* 8.0* 9.3*  HCT 25.7* 27.5* 25.4*  25.8* 30.4*  MCV 67.3* 67.4* 67.2* 66.3* 67.1*  PLT 252 244 248 217 265   Cardiac Enzymes: No results found for this basename: CKTOTAL, CKMB, CKMBINDEX, TROPONINI,  in the last 8760 hours BNP: No components found with this basename: POCBNP,  CBG:  Recent Labs  05/15/13 0756 05/15/13 1221 05/15/13 1620  GLUCAP 126* 120* 127*   TSH:  Recent Labs  05/06/13 0430  TSH 2.037   CBC with Diff    Result: 05/20/2013 3:06 PM   ( Status: F )     C WBC 10.0     4.0-10.5 K/uL SLN   RBC 4.94     4.22-5.81 MIL/uL SLN   Hemoglobin 10.3   L 13.0-17.0 g/dL SLN   Hematocrit 31.1   L 39.0-52.0 % SLN   MCV 63.0   L 78.0-100.0 fL SLN   MCH 20.9   L 26.0-34.0 pg SLN   MCHC 33.1     30.0-36.0 g/dL SLN   RDW 16.8   H 11.5-15.5 % SLN   Platelet Count 228     150-400 K/uL SLN   Granulocyte % 77     43-77 % SLN   Absolute Gran 7.7     1.7-7.7 K/uL SLN   Lymph % 15     12-46 % SLN   Absolute Lymph 1.5     0.7-4.0 K/uL SLN   Mono % 7     3-12 % SLN   Absolute Mono 0.7     0.1-1.0 K/uL SLN   Eos % 1     0-5 % SLN   Absolute Eos 0.1     0.0-0.7 K/uL SLN   Baso % 0     0-1 % SLN   Absolute Baso 0.0     0.0-0.1 K/uL SLN   Smear Review Criteria for review not met  SLN   Basic Metabolic Panel    Result: 05/20/2013 3:11 PM   ( Status: F )       Sodium 146   H 135-145 mEq/L SLN   Potassium 3.4   L 3.5-5.3 mEq/L SLN   Chloride 107     96-112 mEq/L SLN   CO2 25     19-32 mEq/L SLN   Glucose 125   H 70-99 mg/dL SLN   BUN 25   H 6-23 mg/dL SLN   Creatinine 0.80     0.50-1.35 mg/dL SLN   Calcium 8.4     8.4-10.5 mg/dL SLN   Assessment/Plan 1. Hypertension -blood pressure stable at this time, conts on coreg for htn and chf, lisinopril  2. Chronic systolic CHF (congestive heart failure) -pt on every other day lasix however with ongoing hypernatremia and decreased PO intake will stop lasix  at this time and monitor for fluid overload  3. GERD (gastroesophageal reflux disease) -conts on prilosec  4. Dysphagia -pt with recommendation for thicken liquids however refusing to drink water this way; discussed with son and at this time will allow pt to drink plain water for comfort knowing the risk; will cont thickener for all fluids and cont aspiration precautions and recommendations   5. Anemia  Anemia is stable; conts on b12 supplement   6. FTT (failure to thrive) in adult Discussed with son, pt with small weight gain, will increase Remeron to 15 mg at this time and cont to monitor but at this time do not expect a large appetite increase  -conts on supplement  7. BPH with obstruction -conts with foley  catheter -conts on flomax  8. Hyponatrima -will stop lasix, encourage fluid intake and follow up BMP on Friday 06/07/13  40 mins Time TOTAL:  time greater than 50% of total time spent doing pt counseled and coordination of care regarding pt care to family and staff

## 2013-07-16 ENCOUNTER — Non-Acute Institutional Stay (SKILLED_NURSING_FACILITY): Payer: Medicare Other | Admitting: Nurse Practitioner

## 2013-07-16 DIAGNOSIS — R627 Adult failure to thrive: Secondary | ICD-10-CM

## 2013-07-16 DIAGNOSIS — I10 Essential (primary) hypertension: Secondary | ICD-10-CM

## 2013-07-16 DIAGNOSIS — I5022 Chronic systolic (congestive) heart failure: Secondary | ICD-10-CM

## 2013-07-16 DIAGNOSIS — R1310 Dysphagia, unspecified: Secondary | ICD-10-CM

## 2013-07-16 DIAGNOSIS — N400 Enlarged prostate without lower urinary tract symptoms: Secondary | ICD-10-CM

## 2013-07-16 DIAGNOSIS — I509 Heart failure, unspecified: Secondary | ICD-10-CM

## 2013-07-16 DIAGNOSIS — N390 Urinary tract infection, site not specified: Secondary | ICD-10-CM

## 2013-07-16 NOTE — Progress Notes (Signed)
Patient ID: Riley Martinez, male   DOB: 12-31-1914, 80 y.o.   MRN: [id number]    Nursing Home Location:  [place] of Service: SNF (31)  PCP: No primary provider on file.  Allergies  Allergen Reactions  . Ativan [Lorazepam] Other (See Comments)    unknown    Chief Complaint  Patient presents with  . Medical Management of Chronic Issues    HPI:  80 year old male with a PMH of dementia, HTN, BPH (s/p TURP) GERD who is being seen today for routine follow up; since stay at [facility] pt with FTT,  Hyponatremia with dysphagia- pt was refusing thicken liquids and not drinking but now has improved with PO intake. Pt recently dx with MRSA UTI and positive blood cultures, being treated with rocephin IM and doxycyline bid for 7 days, pt has remained afebile and without complaints. Staff has no concerns at this time. Pt tolerating treatment.   Review of Systems:  ROS by staff, pt and record Review of Systems  HENT: Negative for congestion and sore throat.   Respiratory: Negative for shortness of breath.   Cardiovascular: Negative for chest pain.  Gastrointestinal: Negative for abdominal pain, diarrhea and constipation.  Genitourinary: Negative for dysuria.       Foley catheter in place  Musculoskeletal: Negative for falls and myalgias.  Skin: Negative.   Neurological: Negative for headaches.  Psychiatric/Behavioral: Positive for memory loss. Negative for depression. The patient is not nervous/anxious and does not have insomnia.      Past Medical History  Diagnosis Date  . GERD (gastroesophageal reflux disease)   . Hypertension   . BPH (benign prostatic hyperplasia)   . Diabetes mellitus without complication   . Constipation    Past Surgical History  Procedure Laterality Date  . Hip surgery    . Hernia repair     Social History:   reports that he has quit smoking. He has never used smokeless tobacco. He reports that he does not drink alcohol or use  illicit drugs.  Family History  Problem Relation Age of Onset  . Diabetes Mellitus II Mother     Medications: Patient's Medications  New Prescriptions   No medications on file  Previous Medications   ACETAMINOPHEN (TYLENOL) 500 MG TABLET    Take 1,000 mg by mouth every 6 (six) hours as needed for mild pain.    ALBUTEROL (PROVENTIL) (2.5 MG/3ML) 0.083% NEBULIZER SOLUTION    Take 2.5 mg by nebulization every 8 (eight) hours as needed for wheezing or shortness of breath.    ASPIRIN 81 MG TABLET    Take 81 mg by mouth daily.   BISACODYL (DULCOLAX) 10 MG SUPPOSITORY    Place 10 mg rectally daily. For 9 days   CARBAMIDE PEROXIDE (DEBROX) 6.5 % OTIC SOLUTION    Place 5 drops into both ears daily as needed (cerum impaction).    CARVEDILOL (COREG) 3.125 MG TABLET    Take 3.125 mg by mouth 2 (two) times daily with a meal.   CLOTRIMAZOLE (LOTRIMIN) 1 % CREAM    Apply 1 application topically 2 (two) times daily.   DESONIDE (DESOWEN) 0.05 % LOTION    Apply 1 application topically daily. Monday - Thursday - to face and ears   FEEDING SUPPLEMENT, ENSURE, (ENSURE) PUDG    Take 1 Container by mouth 3 (three) times daily between meals.   FLUOCINONIDE CREAM (LIDEX) 0.05 %    Apply 1 application topically 2 (two) times  daily. Monday through Thursday to legs   FLUTICASONE (FLONASE) 50 MCG/ACT NASAL SPRAY    Place 1 spray into both nostrils daily.       LACTOBACILLUS ACIDOPHILUS (BACID) TABS TABLET    Take 1 tablet by mouth 3 (three) times daily.    LISINOPRIL (PRINIVIL,ZESTRIL) 2.5 MG TABLET    Take 2.5 mg by mouth daily.   MALTODEXTRIN-XANTHAN GUM (RESOURCE THICKENUP CLEAR) POWD    For thickening liquids   MIRTAZAPINE (REMERON) 15 MG TABLET    Take 0.5 tablets (7.5 mg total) by mouth at bedtime.   OMEPRAZOLE (PRILOSEC) 20 MG CAPSULE    Take 20 mg by mouth daily.   POLYETHYLENE GLYCOL (MIRALAX / GLYCOLAX) PACKET    Take 17 g by mouth daily as needed for moderate constipation.    POLYVINYL ALCOHOL (LIQUIFILM  TEARS) 1.4 % OPHTHALMIC SOLUTION    Place 2 drops into both eyes 3 (three) times daily.   SENNOSIDES-DOCUSATE SODIUM (SENOKOT-S) 8.6-50 MG TABLET    Take 2 tablets by mouth 2 (two) times daily.   SODIUM CHLORIDE 0.9 % SOLN 100 ML WITH MICAFUNGIN 50 MG SOLR 100 MG    Inject 100 mg into the vein daily. For 2 weeks   TAMSULOSIN (FLOMAX) 0.4 MG CAPS CAPSULE    Take 0.4 mg by mouth daily after supper.    VITAMIN B-12 (CYANOCOBALAMIN) 1000 MCG TABLET    Take 1,000 mcg by mouth daily.  Modified Medications   No medications on file  Discontinued Medications   No medications on file     Physical Exam:  Filed Vitals:   07/16/13 1534  BP: 110/70  Pulse: 64  Temp: 97.1 F (36.2 C)  Resp: 20  Weight: 153 lb (69.4 kg)  SpO2: 98%   Physical Exam  Constitutional: He is well-developed, well-nourished, and in no distress. No distress.  HENT:  Head: Normocephalic and atraumatic.  Right Ear: External ear normal.  Left Ear: External ear normal.  Nose: Nose normal.  Mouth/Throat: Oropharynx is clear and moist. No oropharyngeal exudate.  Eyes: Conjunctivae and EOM are normal. Pupils are equal, round, and reactive to light.  Neck: Normal range of motion. Neck supple.  Cardiovascular: Normal rate, regular rhythm and normal heart sounds.   Pulmonary/Chest: Effort normal and breath sounds normal.  Abdominal: Soft. Bowel sounds are normal. He exhibits no distension. There is no tenderness.  Musculoskeletal: He exhibits no edema and no tenderness.  Neurological: He is alert.  Skin: Skin is warm and dry.      Labs reviewed: Basic Metabolic Panel:  Recent Labs  05/08/13 0455 05/09/13 0445 05/11/13 0415 05/12/13 1017 05/14/13 0350  NA 148*  --  144  --  146  K 3.6* 4.7 3.5*  --  3.5*  CL 111  --  105  --  107  CO2 25  --  27  --  27  GLUCOSE 154*  --  124*  --  103*  BUN 22  --  12  --  11  CREATININE 1.11  --  0.87 0.73 0.91  CALCIUM 8.0*  --  8.3*  --  8.4   Liver Function  Tests:  Recent Labs  05/05/13 1823 05/06/13 0430  AST 11 11  ALT 14 14  ALKPHOS 34* 32*  BILITOT 0.8 0.8  PROT 6.1 6.2  ALBUMIN 2.3* 2.3*   No results found for this basename: LIPASE, AMYLASE,  in the last 8760 hours No results found for this basename: AMMONIA,  in  the last 8760 hours CBC:  Recent Labs  05/05/13 1823 05/06/13 0430 05/08/13 0455 05/11/13 0415 05/14/13 0350  WBC 8.7 8.7 7.2 6.4 8.9  NEUTROABS 7.3 6.7  --   --   --   HGB 8.1* 8.5* 7.8* 8.0* 9.3*  HCT 25.7* 27.5* 25.4* 25.8* 30.4*  MCV 67.3* 67.4* 67.2* 66.3* 67.1*  PLT 252 244 248 217 265   TSH:  Recent Labs  05/06/13 0430  TSH 2.037   CBC with Diff    Result: 05/20/2013 3:06 PM   ( Status: F )     C WBC 10.0     4.0-10.5 K/uL SLN   RBC 4.94     4.22-5.81 MIL/uL SLN   Hemoglobin 10.3   L 13.0-17.0 g/dL SLN   Hematocrit 31.1   L 39.0-52.0 % SLN   MCV 63.0   L 78.0-100.0 fL SLN   MCH 20.9   L 26.0-34.0 pg SLN   MCHC 33.1     30.0-36.0 g/dL SLN   RDW 16.8   H 11.5-15.5 % SLN   Platelet Count 228     150-400 K/uL SLN   Granulocyte % 77     43-77 % SLN   Absolute Gran 7.7     1.7-7.7 K/uL SLN   Lymph % 15     12-46 % SLN   Absolute Lymph 1.5     0.7-4.0 K/uL SLN   Mono % 7     3-12 % SLN   Absolute Mono 0.7     0.1-1.0 K/uL SLN   Eos % 1     0-5 % SLN   Absolute Eos 0.1     0.0-0.7 K/uL SLN   Baso % 0     0-1 % SLN   Absolute Baso 0.0     0.0-0.1 K/uL SLN   Smear Review Criteria for review not met  SLN   Basic Metabolic Panel    Result: 05/20/2013 3:11 PM   ( Status: F )       Sodium 146   H 135-145 mEq/L SLN   Potassium 3.4   L 3.5-5.3 mEq/L SLN   Chloride 107     96-112 mEq/L SLN   CO2 25     19-32 mEq/L SLN   Glucose 125   H 70-99 mg/dL SLN   BUN 25   H 6-23 mg/dL SLN   Creatinine 0.80     0.50-1.35 mg/dL SLN   Calcium 8.4     8.4-10.5  CBC with Diff    Result: 07/12/2013 11:27 AM   ( Status: F )     C WBC 10.1     4.0-10.5 K/uL SLN   RBC 4.53     3.87-5.11 MIL/uL SLN    Hemoglobin 9.2   L 12.0-15.0 g/dL SLN   Hematocrit 29.5   L 36.0-46.0 % SLN   MCV 65.1   L 78.0-100.0 fL SLN   MCH 20.3   L 26.0-34.0 pg SLN   MCHC 31.2     30.0-36.0 g/dL SLN   RDW 17.6   H 11.5-15.5 % SLN   Platelet Count 193     150-400 K/uL SLN   Granulocyte % 77     43-77 % SLN   Absolute Gran 7.8   H 1.7-7.7 K/uL SLN   Lymph % 11   L 12-46 % SLN   Absolute Lymph 1.1     0.7-4.0 K/uL SLN   Mono % 11  3-12 % SLN   Absolute Mono 1.1   H 0.1-1.0 K/uL SLN   Eos % 1     0-5 % SLN   Absolute Eos 0.1     0.0-0.7 K/uL SLN   Baso % 0     0-1 % SLN   Absolute Baso 0.0     0.0-0.1 K/uL SLN   Smear Review Criteria for review not met  SLN   Basic Metabolic Panel    Result: 07/12/2013 11:25 AM   ( Status: F )       Sodium 139     135-145 mEq/L SLN   Potassium 3.8     3.5-5.3 mEq/L SLN   Chloride 104     96-112 mEq/L SLN   CO2 25     19-32 mEq/L SLN   Glucose 173   H 70-99 mg/dL SLN   BUN 17     6-23 mg/dL SLN   Creatinine 0.81     0.50-1.10 mg/dL SLN   Calcium 8.4     Assessment/Plan 1. Hypertension -controlled on current medications   2. Chronic systolic CHF (congestive heart failure) -without signs of exacerbation, conts off lasix with good effect   3. Dysphagia -no signs of asp pne, conts to be followed by ST   4. BPH (benign prostatic hyperplasia) -with foley catheter, conts on flomax and finasteride  5. FTT (failure to thrive) in adult -increase in appetite and weight gain, conts on Remeron and supplement with good effect  6. UTI (urinary tract infection) MRSA UTI with positive staph in blood cultures, has been started on doxycyline due to UTI and remains on rocephin IM 1 gm daily due to blood cultures, pt has been a febrile and at baseline  -cont with supportive care

## 2013-08-15 ENCOUNTER — Non-Acute Institutional Stay (SKILLED_NURSING_FACILITY): Payer: Medicare Other | Admitting: Nurse Practitioner

## 2013-08-15 DIAGNOSIS — I509 Heart failure, unspecified: Secondary | ICD-10-CM

## 2013-08-15 DIAGNOSIS — R1310 Dysphagia, unspecified: Secondary | ICD-10-CM

## 2013-08-15 DIAGNOSIS — N400 Enlarged prostate without lower urinary tract symptoms: Secondary | ICD-10-CM

## 2013-08-15 DIAGNOSIS — K5900 Constipation, unspecified: Secondary | ICD-10-CM

## 2013-08-15 DIAGNOSIS — I5022 Chronic systolic (congestive) heart failure: Secondary | ICD-10-CM

## 2013-08-15 DIAGNOSIS — R509 Fever, unspecified: Secondary | ICD-10-CM

## 2013-08-15 DIAGNOSIS — D649 Anemia, unspecified: Secondary | ICD-10-CM

## 2013-08-15 DIAGNOSIS — I10 Essential (primary) hypertension: Secondary | ICD-10-CM

## 2013-08-15 NOTE — Progress Notes (Signed)
Patient ID: Riley Martinez, male   DOB: 03-31-1914, 80 y.o.   MRN: [id number]    Nursing Home Location:  [place] of Service: SNF (31)  PCP: No primary provider on file.  Allergies  Allergen Reactions  . Ativan [Lorazepam] Other (See Comments)    unknown    Chief Complaint  Patient presents with  . Medical Management of Chronic Issues    HPI:  80 year old male with a PMH of dementia, HTN, BPH (s/p TURP) GERD who is being seen today for routine follow up; pt with recent temperature of 99.8, Urine, blood work done without any finding for why he had temp, otherwise doing well without any changes in behaviors eating well, BM regular, without any changes in mental status    Review of Systems:  Review of Systems  Constitutional: Negative for weight loss and malaise/fatigue.  HENT: Negative for congestion and sore throat.   Respiratory: Positive for cough. Negative for shortness of breath.   Cardiovascular: Negative for chest pain.  Gastrointestinal: Negative for abdominal pain, diarrhea and constipation.  Genitourinary: Negative for dysuria.       Foley catheter  Musculoskeletal: Negative for falls and myalgias.       Reports pain in lower legs  Skin: Negative.   Neurological: Positive for weakness (on-going). Negative for dizziness and headaches.  Psychiatric/Behavioral: Positive for memory loss. Negative for depression. The patient is not nervous/anxious and does not have insomnia.      Past Medical History  Diagnosis Date  . GERD (gastroesophageal reflux disease)   . Hypertension   . BPH (benign prostatic hyperplasia)   . Diabetes mellitus without complication   . Constipation    Past Surgical History  Procedure Laterality Date  . Hip surgery    . Hernia repair     Social History:   reports that he has quit smoking. He has never used smokeless tobacco. He reports that he does not drink alcohol or use illicit drugs.  Family History    Problem Relation Age of Onset  . Diabetes Mellitus II Mother     Medications: Patient's Medications  New Prescriptions   No medications on file  Previous Medications   ACETAMINOPHEN (TYLENOL) 500 MG TABLET    Take 1,000 mg by mouth every 6 (six) hours as needed for mild pain.    ALBUTEROL (PROVENTIL) (2.5 MG/3ML) 0.083% NEBULIZER SOLUTION    Take 2.5 mg by nebulization every 8 (eight) hours as needed for wheezing or shortness of breath.    ASPIRIN 81 MG TABLET    Take 81 mg by mouth daily.   BISACODYL (DULCOLAX) 10 MG SUPPOSITORY    Place 10 mg rectally daily. For 9 days   CARBAMIDE PEROXIDE (DEBROX) 6.5 % OTIC SOLUTION    Place 5 drops into both ears daily as needed (cerum impaction).    CARVEDILOL (COREG) 3.125 MG TABLET    Take 3.125 mg by mouth 2 (two) times daily with a meal.   CLOTRIMAZOLE (LOTRIMIN) 1 % CREAM    Apply 1 application topically 2 (two) times daily.   DESONIDE (DESOWEN) 0.05 % LOTION    Apply 1 application topically daily. Monday - Thursday - to face and ears   FEEDING SUPPLEMENT, ENSURE, (ENSURE) PUDG    Take 1 Container by mouth 3 (three) times daily between meals.   FLUOCINONIDE CREAM (LIDEX) 0.05 %    Apply 1 application topically 2 (two) times daily. Monday through Thursday to legs  FLUTICASONE (FLONASE) 50 MCG/ACT NASAL SPRAY    Place 1 spray into both nostrils daily.   FUROSEMIDE (LASIX) 20 MG TABLET    Take 20 mg by mouth every other day.   LACTOBACILLUS ACIDOPHILUS (BACID) TABS TABLET    Take 1 tablet by mouth 3 (three) times daily.    LISINOPRIL (PRINIVIL,ZESTRIL) 2.5 MG TABLET    Take 2.5 mg by mouth daily.   MALTODEXTRIN-XANTHAN GUM (RESOURCE THICKENUP CLEAR) POWD    For thickening liquids   MIRTAZAPINE (REMERON) 15 MG TABLET    Take 0.5 tablets (7.5 mg total) by mouth at bedtime.   OMEPRAZOLE (PRILOSEC) 20 MG CAPSULE    Take 20 mg by mouth daily.   POLYETHYLENE GLYCOL (MIRALAX / GLYCOLAX) PACKET    Take 17 g by mouth daily as needed for moderate  constipation.    POLYVINYL ALCOHOL (LIQUIFILM TEARS) 1.4 % OPHTHALMIC SOLUTION    Place 2 drops into both eyes 3 (three) times daily.   SENNOSIDES-DOCUSATE SODIUM (SENOKOT-S) 8.6-50 MG TABLET    Take 2 tablets by mouth 2 (two) times daily.   SODIUM CHLORIDE 0.9 % SOLN 100 ML WITH MICAFUNGIN 50 MG SOLR 100 MG    Inject 100 mg into the vein daily. For 2 weeks   TAMSULOSIN (FLOMAX) 0.4 MG CAPS CAPSULE    Take 0.4 mg by mouth daily after supper.    VITAMIN B-12 (CYANOCOBALAMIN) 1000 MCG TABLET    Take 1,000 mcg by mouth daily.  Modified Medications   No medications on file  Discontinued Medications   No medications on file     Physical Exam:  Filed Vitals:   08/15/13 2133  BP: 138/67  Pulse: 62  Temp: 98.3 F (36.8 C)  Resp: 19  Weight: 151 lb (68.493 kg)    Physical Exam  Constitutional: He is well-developed, well-nourished, and in no distress. No distress.  HENT:  Head: Normocephalic and atraumatic.  Right Ear: External ear normal.  Left Ear: External ear normal.  Nose: Nose normal.  Mouth/Throat: Oropharynx is clear and moist. No oropharyngeal exudate.  Eyes: Conjunctivae and EOM are normal. Pupils are equal, round, and reactive to light.  Neck: Normal range of motion. Neck supple.  Cardiovascular: Normal rate, regular rhythm and normal heart sounds.   Pulmonary/Chest: Effort normal.  Coarse BS on left lower lobe  Abdominal: Soft. Bowel sounds are normal. He exhibits no distension. There is no tenderness.  Musculoskeletal: He exhibits no edema and no tenderness.  Neurological: He is alert.  Skin: Skin is warm and dry.  Psychiatric: He expresses impulsivity. He exhibits disordered thought content, abnormal new learning ability, abnormal recent memory and abnormal remote memory.     Labs reviewed: Basic Metabolic Panel:  Recent Labs  05/08/13 0455 05/09/13 0445 05/11/13 0415 05/12/13 1017 05/14/13 0350  NA 148*  --  144  --  146  K 3.6* 4.7 3.5*  --  3.5*  CL  111  --  105  --  107  CO2 25  --  27  --  27  GLUCOSE 154*  --  124*  --  103*  BUN 22  --  12  --  11  CREATININE 1.11  --  0.87 0.73 0.91  CALCIUM 8.0*  --  8.3*  --  8.4   Liver Function Tests:  Recent Labs  05/05/13 1823 05/06/13 0430  AST 11 11  ALT 14 14  ALKPHOS 34* 32*  BILITOT 0.8 0.8  PROT 6.1 6.2  ALBUMIN  2.3* 2.3*   No results found for this basename: LIPASE, AMYLASE,  in the last 8760 hours No results found for this basename: AMMONIA,  in the last 8760 hours CBC:  Recent Labs  05/05/13 1823 05/06/13 0430 05/08/13 0455 05/11/13 0415 05/14/13 0350  WBC 8.7 8.7 7.2 6.4 8.9  NEUTROABS 7.3 6.7  --   --   --   HGB 8.1* 8.5* 7.8* 8.0* 9.3*  HCT 25.7* 27.5* 25.4* 25.8* 30.4*  MCV 67.3* 67.4* 67.2* 66.3* 67.1*  PLT 252 244 248 217 265   Cardiac Enzymes: No results found for this basename: CKTOTAL, CKMB, CKMBINDEX, TROPONINI,  in the last 8760 hours BNP: No components found with this basename: POCBNP,  CBG:  Recent Labs  05/15/13 0756 05/15/13 1221 05/15/13 1620  GLUCAP 126* 120* 127*   TSH:  Recent Labs  05/06/13 0430  TSH 2.037   CBC with Diff  Result: 05/20/2013 3:06 PM ( Status: F ) C  WBC 10.0 4.0-10.5 K/uL SLN  RBC 4.94 4.22-5.81 MIL/uL SLN  Hemoglobin 10.3 L 13.0-17.0 g/dL SLN  Hematocrit 31.1 L 39.0-52.0 % SLN  MCV 63.0 L 78.0-100.0 fL SLN  MCH 20.9 L 26.0-34.0 pg SLN  MCHC 33.1 30.0-36.0 g/dL SLN  RDW 16.8 H 11.5-15.5 % SLN  Platelet Count 228 150-400 K/uL SLN  Granulocyte % 77 43-77 % SLN  Absolute Gran 7.7 1.7-7.7 K/uL SLN  Lymph % 15 12-46 % SLN  Absolute Lymph 1.5 0.7-4.0 K/uL SLN  Mono % 7 3-12 % SLN  Absolute Mono 0.7 0.1-1.0 K/uL SLN  Eos % 1 0-5 % SLN  Absolute Eos 0.1 0.0-0.7 K/uL SLN  Baso % 0 0-1 % SLN  Absolute Baso 0.0 0.0-0.1 K/uL SLN  Smear Review Criteria for review not met SLN  Basic Metabolic Panel  Result: 9/47/0962 3:11 PM ( Status: F )  Sodium 146 H 135-145 mEq/L SLN  Potassium 3.4 L 3.5-5.3 mEq/L SLN    Chloride 107 96-112 mEq/L SLN  CO2 25 19-32 mEq/L SLN  Glucose 125 H 70-99 mg/dL SLN  BUN 25 H 6-23 mg/dL SLN  Creatinine 0.80 0.50-1.35 mg/dL SLN  Calcium 8.4 8.4-10.5  CBC with Diff  Result: 07/12/2013 11:27 AM ( Status: F ) C  WBC 10.1 4.0-10.5 K/uL SLN  RBC 4.53 3.87-5.11 MIL/uL SLN  Hemoglobin 9.2 L 12.0-15.0 g/dL SLN  Hematocrit 29.5 L 36.0-46.0 % SLN  MCV 65.1 L 78.0-100.0 fL SLN  MCH 20.3 L 26.0-34.0 pg SLN  MCHC 31.2 30.0-36.0 g/dL SLN  RDW 17.6 H 11.5-15.5 % SLN  Platelet Count 193 150-400 K/uL SLN  Granulocyte % 77 43-77 % SLN  Absolute Gran 7.8 H 1.7-7.7 K/uL SLN  Lymph % 11 L 12-46 % SLN  Absolute Lymph 1.1 0.7-4.0 K/uL SLN  Mono % 11 3-12 % SLN  Absolute Mono 1.1 H 0.1-1.0 K/uL SLN  Eos % 1 0-5 % SLN  Absolute Eos 0.1 0.0-0.7 K/uL SLN  Baso % 0 0-1 % SLN  Absolute Baso 0.0 0.0-0.1 K/uL SLN  Smear Review Criteria for review not met SLN  Basic Metabolic Panel  Result: 8/36/6294 11:25 AM ( Status: F )  Sodium 139 135-145 mEq/L SLN  Potassium 3.8 3.5-5.3 mEq/L SLN  Chloride 104 96-112 mEq/L SLN  CO2 25 19-32 mEq/L SLN  Glucose 173 H 70-99 mg/dL SLN  BUN 17 6-23 mg/dL SLN  Creatinine 0.81 0.50-1.10 mg/dL SLN  Calcium 8.4    Assessment/Plan 1. Constipation -controlled on current medications   2. Dysphagia -on  thicken liquids, at aspiration risk , will get xray due to fevers   3. Hypertension -currently controled on medications  4. Chronic systolic CHF (congestive heart failure) -remains stable, without worsening edema or shortness of breath -off lasix  5. BPH (benign prostatic hyperplasia) -conts with indwelling catheter   6. Anemia -will follow up cbc and add iron studies   7. Fever Cbc without elevation in wbc, urine pending culture -pt with hx of dysphagia, noted cough, will get chest xray at this time.  -bmp not done, reordered

## 2013-08-28 LAB — HEMOGLOBIN A1C: Hgb A1c MFr Bld: 6.6 % — AB (ref 4.0–6.0)

## 2013-09-13 ENCOUNTER — Non-Acute Institutional Stay (SKILLED_NURSING_FACILITY): Payer: Medicare Other | Admitting: Nurse Practitioner

## 2013-09-13 DIAGNOSIS — I5022 Chronic systolic (congestive) heart failure: Secondary | ICD-10-CM

## 2013-09-13 DIAGNOSIS — E119 Type 2 diabetes mellitus without complications: Secondary | ICD-10-CM

## 2013-09-13 DIAGNOSIS — I10 Essential (primary) hypertension: Secondary | ICD-10-CM

## 2013-09-13 DIAGNOSIS — K5900 Constipation, unspecified: Secondary | ICD-10-CM

## 2013-09-13 DIAGNOSIS — R627 Adult failure to thrive: Secondary | ICD-10-CM

## 2013-09-13 DIAGNOSIS — N400 Enlarged prostate without lower urinary tract symptoms: Secondary | ICD-10-CM

## 2013-09-13 DIAGNOSIS — I509 Heart failure, unspecified: Secondary | ICD-10-CM

## 2013-09-13 NOTE — Progress Notes (Signed)
Patient ID: Riley Martinez, male   DOB: [birth year], 80 y.o.   MRN: [id number]    Nursing Home Location:  [place] of Service: SNF (31)  PCP: No primary provider on file.  Allergies  Allergen Reactions  . Ativan [Lorazepam] Other (See Comments)    unknown    Chief Complaint  Patient presents with  . Medical Management of Chronic Issues    HPI:  80 year old male with a PMH of dementia, HTN, BPH (s/p TURP) GERD who is being seen today for routine follow up; doing well per staff without concerns, pt undergoing treatment for UTI however appears asymptomatic.   Review of Systems:  Review of Systems  Constitutional: Negative for fever, chills, weight loss and malaise/fatigue.  HENT: Negative for congestion and sore throat.   Respiratory: Negative for cough and shortness of breath.   Cardiovascular: Negative for chest pain.  Gastrointestinal: Negative for abdominal pain, diarrhea and constipation.  Genitourinary: Negative for dysuria.       Foley catheter  Musculoskeletal: Negative for falls and myalgias.       Reports pain in lower legs  Skin: Negative.   Neurological: Negative for dizziness and headaches.  Psychiatric/Behavioral: Positive for memory loss. Negative for depression. The patient is not nervous/anxious and does not have insomnia.      Past Medical History  Diagnosis Date  . GERD (gastroesophageal reflux disease)   . Hypertension   . BPH (benign prostatic hyperplasia)   . Diabetes mellitus without complication   . Constipation    Past Surgical History  Procedure Laterality Date  . Hip surgery    . Hernia repair     Social History:   reports that he has quit smoking. He has never used smokeless tobacco. He reports that he does not drink alcohol or use illicit drugs.  Family History  Problem Relation Age of Onset  . Diabetes Mellitus II Mother     Medications: Patient's Medications  New Prescriptions   No medications on file    Previous Medications   ACETAMINOPHEN (TYLENOL) 500 MG TABLET    Take 1,000 mg by mouth every 6 (six) hours as needed for mild pain.    ALBUTEROL (PROVENTIL) (2.5 MG/3ML) 0.083% NEBULIZER SOLUTION    Take 2.5 mg by nebulization every 8 (eight) hours as needed for wheezing or shortness of breath.    ASPIRIN 81 MG TABLET    Take 81 mg by mouth daily.   BISACODYL (DULCOLAX) 10 MG SUPPOSITORY    Place 10 mg rectally daily. If constipation is not relieved by by MOM give 10 mg Bisacodyl suppository rectally x 1 dose in 24 hours as needed   CARBAMIDE PEROXIDE (DEBROX) 6.5 % OTIC SOLUTION    Place 5 drops into both ears daily as needed (cerum impaction).    CARVEDILOL (COREG) 3.125 MG TABLET    Take 3.125 mg by mouth 2 (two) times daily with a meal.   CLOTRIMAZOLE (LOTRIMIN) 1 % CREAM    Apply 1 application topically 2 (two) times daily.   DESONIDE (DESOWEN) 0.05 % LOTION    Apply 1 application topically 2 (two) times daily. Monday - Thursday - to lower legs   FEEDING SUPPLEMENT, ENSURE, (ENSURE) PUDG    Take 1 Container by mouth 3 (three) times daily between meals.   FINASTERIDE (PROSCAR) 5 MG TABLET    Take 5 mg by mouth daily.   FLUOCINONIDE CREAM (LIDEX) 0.05 %    Apply 1  application topically 2 (two) times daily. Monday through Thursday to legs   FLUTICASONE (FLONASE) 50 MCG/ACT NASAL SPRAY    Place 1 spray into both nostrils daily.   LISINOPRIL (PRINIVIL,ZESTRIL) 5 MG TABLET    1/2 tablet by mouth daily   OMEPRAZOLE (PRILOSEC) 20 MG CAPSULE    Take 20 mg by mouth daily.   POLYETHYLENE GLYCOL (MIRALAX / GLYCOLAX) PACKET    Take 17 g by mouth daily as needed for moderate constipation.    POLYVINYL ALCOHOL (LIQUIFILM TEARS) 1.4 % OPHTHALMIC SOLUTION    Place 2 drops into both eyes 3 (three) times daily.   PROMETHAZINE (PHENERGAN) 25 MG TABLET    Take 25 mg by mouth every 6 (six) hours as needed for nausea or vomiting.   SENNOSIDES-DOCUSATE SODIUM (SENOKOT-S) 8.6-50 MG TABLET    Take 2 tablets by mouth  2 (two) times daily.   TAMSULOSIN (FLOMAX) 0.4 MG CAPS CAPSULE    Take 0.4 mg by mouth daily after supper.    VITAMIN B-12 (CYANOCOBALAMIN) 1000 MCG TABLET    Take 1,000 mcg by mouth daily.  Modified Medications   Modified Medication Previous Medication   MIRTAZAPINE (REMERON) 15 MG TABLET mirtazapine (REMERON) 15 MG tablet      Take 15 mg by mouth at bedtime.    Take 0.5 tablets (7.5 mg total) by mouth at bedtime.  Discontinued Medications   FUROSEMIDE (LASIX) 20 MG TABLET    Take 20 mg by mouth every other day.   LACTOBACILLUS ACIDOPHILUS (BACID) TABS TABLET    Take 1 tablet by mouth 3 (three) times daily.    LISINOPRIL (PRINIVIL,ZESTRIL) 10 MG TABLET    Take 10 mg by mouth daily.   LISINOPRIL (PRINIVIL,ZESTRIL) 2.5 MG TABLET    Take 2.5 mg by mouth daily.   MALTODEXTRIN-XANTHAN GUM (RESOURCE THICKENUP CLEAR) POWD    For thickening liquids   SODIUM CHLORIDE 0.9 % SOLN 100 ML WITH MICAFUNGIN 50 MG SOLR 100 MG    Inject 100 mg into the vein daily. For 2 weeks     Physical Exam: Physical Exam  Constitutional: He is well-developed, well-nourished, and in no distress. No distress.  HENT:  Head: Normocephalic and atraumatic.  Right Ear: External ear normal.  Left Ear: External ear normal.  Nose: Nose normal.  Mouth/Throat: Oropharynx is clear and moist. No oropharyngeal exudate.  Eyes: Conjunctivae and EOM are normal. Pupils are equal, round, and reactive to light.  Neck: Normal range of motion. Neck supple.  Cardiovascular: Normal rate, regular rhythm and normal heart sounds.   Pulmonary/Chest: Effort normal and breath sounds normal.  Abdominal: Soft. Bowel sounds are normal. He exhibits no distension. There is no tenderness.  Genitourinary:  Foley catheter intact   Musculoskeletal: He exhibits no edema and no tenderness.  Neurological: He is alert.  Skin: Skin is warm and dry.  Psychiatric: He expresses impulsivity. He exhibits disordered thought content, abnormal new learning  ability, abnormal recent memory and abnormal remote memory.    Filed Vitals:   09/13/13 1156  BP: 122/74  Pulse: 76  Temp: 98.6 F (37 C)  TempSrc: Oral  Resp: 18  Weight: 154 lb (69.854 kg)  SpO2: 98%      Labs reviewed: Basic Metabolic Panel:  Recent Labs  05/08/13 0455 05/09/13 0445 05/11/13 0415 05/12/13 1017 05/14/13 0350  NA 148*  --  144  --  146  K 3.6* 4.7 3.5*  --  3.5*  CL 111  --  105  --  107  CO2 25  --  27  --  27  GLUCOSE 154*  --  124*  --  103*  BUN 22  --  12  --  11  CREATININE 1.11  --  0.87 0.73 0.91  CALCIUM 8.0*  --  8.3*  --  8.4   Liver Function Tests:  Recent Labs  05/05/13 1823 05/06/13 0430  AST 11 11  ALT 14 14  ALKPHOS 34* 32*  BILITOT 0.8 0.8  PROT 6.1 6.2  ALBUMIN 2.3* 2.3*   No results found for this basename: LIPASE, AMYLASE,  in the last 8760 hours No results found for this basename: AMMONIA,  in the last 8760 hours CBC:  Recent Labs  05/05/13 1823 05/06/13 0430 05/08/13 0455 05/11/13 0415 05/14/13 0350  WBC 8.7 8.7 7.2 6.4 8.9  NEUTROABS 7.3 6.7  --   --   --   HGB 8.1* 8.5* 7.8* 8.0* 9.3*  HCT 25.7* 27.5* 25.4* 25.8* 30.4*  MCV 67.3* 67.4* 67.2* 66.3* 67.1*  PLT 252 244 248 217 265   Cardiac Enzymes: No results found for this basename: CKTOTAL, CKMB, CKMBINDEX, TROPONINI,  in the last 8760 hours BNP: No components found with this basename: POCBNP,  CBG:  Recent Labs  05/15/13 0756 05/15/13 1221 05/15/13 1620  GLUCAP 126* 120* 127*   TSH:  Recent Labs  05/06/13 0430  TSH 2.037   CBC with Diff  Result: 05/20/2013 3:06 PM ( Status: F ) C  WBC 10.0 4.0-10.5 K/uL SLN  RBC 4.94 4.22-5.81 MIL/uL SLN  Hemoglobin 10.3 L 13.0-17.0 g/dL SLN  Hematocrit 31.1 L 39.0-52.0 % SLN  MCV 63.0 L 78.0-100.0 fL SLN  MCH 20.9 L 26.0-34.0 pg SLN  MCHC 33.1 30.0-36.0 g/dL SLN  RDW 16.8 H 11.5-15.5 % SLN  Platelet Count 228 150-400 K/uL SLN  Granulocyte % 77 43-77 % SLN  Absolute Gran 7.7 1.7-7.7 K/uL SLN    Lymph % 15 12-46 % SLN  Absolute Lymph 1.5 0.7-4.0 K/uL SLN  Mono % 7 3-12 % SLN  Absolute Mono 0.7 0.1-1.0 K/uL SLN  Eos % 1 0-5 % SLN  Absolute Eos 0.1 0.0-0.7 K/uL SLN  Baso % 0 0-1 % SLN  Absolute Baso 0.0 0.0-0.1 K/uL SLN  Smear Review Criteria for review not met SLN  Basic Metabolic Panel  Result: 7/74/1287 3:11 PM ( Status: F )  Sodium 146 H 135-145 mEq/L SLN  Potassium 3.4 L 3.5-5.3 mEq/L SLN  Chloride 107 96-112 mEq/L SLN  CO2 25 19-32 mEq/L SLN  Glucose 125 H 70-99 mg/dL SLN  BUN 25 H 6-23 mg/dL SLN  Creatinine 0.80 0.50-1.35 mg/dL SLN  Calcium 8.4 8.4-10.5  CBC with Diff  Result: 07/12/2013 11:27 AM ( Status: F ) C  WBC 10.1 4.0-10.5 K/uL SLN  RBC 4.53 3.87-5.11 MIL/uL SLN  Hemoglobin 9.2 L 12.0-15.0 g/dL SLN  Hematocrit 29.5 L 36.0-46.0 % SLN  MCV 65.1 L 78.0-100.0 fL SLN  MCH 20.3 L 26.0-34.0 pg SLN  MCHC 31.2 30.0-36.0 g/dL SLN  RDW 17.6 H 11.5-15.5 % SLN  Platelet Count 193 150-400 K/uL SLN  Granulocyte % 77 43-77 % SLN  Absolute Gran 7.8 H 1.7-7.7 K/uL SLN  Lymph % 11 L 12-46 % SLN  Absolute Lymph 1.1 0.7-4.0 K/uL SLN  Mono % 11 3-12 % SLN  Absolute Mono 1.1 H 0.1-1.0 K/uL SLN  Eos % 1 0-5 % SLN  Absolute Eos 0.1 0.0-0.7 K/uL SLN  Baso % 0 0-1 % SLN  Absolute Baso 0.0 0.0-0.1 K/uL SLN  Smear Review Criteria for review not met SLN  Basic Metabolic Panel  Result: 10/20/3662 11:25 AM ( Status: F )  Sodium 139 135-145 mEq/L SLN  Potassium 3.8 3.5-5.3 mEq/L SLN  Chloride 104 96-112 mEq/L SLN  CO2 25 19-32 mEq/L SLN  Glucose 173 H 70-99 mg/dL SLN  BUN 17 6-23 mg/dL SLN  Creatinine 0.81 0.50-1.10 mg/dL SLN  Calcium 8.4  CBC with Diff    Result: 09/02/2013 4:06 PM   ( Status: F )       WBC 7.6     4.0-10.5 K/uL SLN   RBC 4.99     4.22-5.81 MIL/uL SLN   Hemoglobin 10.3   L 13.0-17.0 g/dL SLN   Hematocrit 32.5   L 39.0-52.0 % SLN   MCV 65.1   L 78.0-100.0 fL SLN   MCH 20.6   L 26.0-34.0 pg SLN   MCHC 31.7     30.0-36.0 g/dL SLN   RDW 16.8    H 11.5-15.5 % SLN   Platelet Count 184     150-400 K/uL SLN   Granulocyte % 57     43-77 % SLN   Absolute Gran 4.3     1.7-7.7 K/uL SLN   Lymph % 30     12-46 % SLN   Absolute Lymph 2.3     0.7-4.0 K/uL SLN   Mono % 8     3-12 % SLN   Absolute Mono 0.6     0.1-1.0 K/uL SLN   Eos % 5     0-5 % SLN   Absolute Eos 0.4     0.0-0.7 K/uL SLN   Baso % 0     0-1 % SLN   Absolute Baso 0.0     0.0-0.1 K/uL SLN   Smear Review Criteria for review not met  SLN   Basic Metabolic Panel    Result: 09/02/2013 3:35 PM   ( Status: F )       Sodium 140     135-145 mEq/L SLN   Potassium 4.3     3.5-5.3 mEq/L SLN   Chloride 105     96-112 mEq/L SLN   CO2 23     19-32 mEq/L SLN   Glucose 130   H 70-99 mg/dL SLN   BUN 22     6-23 mg/dL SLN   Creatinine 0.71     0.50-1.35 mg/dL SLN   Calcium 8.7     8.4-10.5 mg/dL SLN  Iron and IBC    Result: 08/28/2013 3:40 PM   ( Status: F )     C Iron 46     42-165 ug/dL SLN   UIBC 148     125-400 ug/dL SLN   TIBC 194   L 215-435 ug/dL SLN   %SAT 24     20-55 % SLN   CBC NO Diff (Complete Blood Count)    Result: 08/28/2013 3:26 PM   ( Status: F )       WBC 7.5     4.0-10.5 K/uL SLN   RBC 4.68     4.22-5.81 MIL/uL SLN   Hemoglobin 9.6   L 13.0-17.0 g/dL SLN   Hematocrit 29.8   L 39.0-52.0 % SLN   MCV 63.7   L 78.0-100.0 fL SLN   MCH 20.5   L 26.0-34.0 pg SLN   MCHC 32.2     30.0-36.0 g/dL SLN   RDW 16.5   H 11.5-15.5 %  SLN   Platelet Count 226     150-400 K/uL SLN   Basic Metabolic Panel    Result: 08/28/2013 3:40 PM   ( Status: F )       Sodium 139     135-145 mEq/L SLN   Potassium 4.3     3.5-5.3 mEq/L SLN   Chloride 106     96-112 mEq/L SLN   CO2 25     19-32 mEq/L SLN   Glucose 123   H 70-99 mg/dL SLN   BUN 26   H 6-23 mg/dL SLN   Creatinine 0.79     0.50-1.35 mg/dL SLN   Calcium 8.9     8.4-10.5 mg/dL SLN   Ferritin    Result: 08/28/2013 4:03 PM   ( Status: F )       Ferritin 120     22-322 ng/mL SLN   Hemoglobin A1C    Result: 08/28/2013 3:44 PM   ( Status:  F )       Hemoglobin A1C 6.6   H <5.7 % SLN C Estimated Average Glucose 143   H <117 mg/dL SLN   Assessment/Plan  1. Essential hypertension Stable on current medications  2. Diabetes mellitus without complication Diet controlled, A1c 6.6  3. Constipation, unspecified constipation type -stable   4. BPH (benign prostatic hyperplasia) -conts with chronic foley catheter   5. FTT (failure to thrive) in adult -has done well on remeron and supplements, weight is now at 154 which has remained stable in the last few months   6. Chronic systolic CHF (congestive heart failure) -has been stable, off lasix due to poor PO intake, no signs of excessive fluid, conts on betablocker, ace and asa  7. UTI -conts on antibiotics, without increased confusion, fevers, or symptoms  8. Anemia -hgb improved, will cont to follow

## 2013-10-04 LAB — BASIC METABOLIC PANEL
BUN: 27 mg/dL — AB (ref 4–21)
Creatinine: 0.8 mg/dL (ref <=1.3)
GLUCOSE: 111 mg/dL
Potassium: 4.7 mmol/L (ref 3.4–5.3)
Sodium: 139 mmol/L (ref 137–147)

## 2013-10-04 LAB — CBC AND DIFFERENTIAL
HCT: 31 % — AB (ref 41–53)
Hemoglobin: 9.8 g/dL — AB (ref 13.5–17.5)
PLATELETS: 198 10*3/uL (ref 150–399)
WBC: 8.5 10^3/mL

## 2013-10-18 ENCOUNTER — Non-Acute Institutional Stay (SKILLED_NURSING_FACILITY): Payer: Medicare Other | Admitting: Nurse Practitioner

## 2013-10-18 ENCOUNTER — Encounter: Payer: Self-pay | Admitting: Nurse Practitioner

## 2013-10-18 DIAGNOSIS — N400 Enlarged prostate without lower urinary tract symptoms: Secondary | ICD-10-CM

## 2013-10-18 DIAGNOSIS — I5022 Chronic systolic (congestive) heart failure: Secondary | ICD-10-CM

## 2013-10-18 DIAGNOSIS — I10 Essential (primary) hypertension: Secondary | ICD-10-CM

## 2013-10-18 DIAGNOSIS — K5900 Constipation, unspecified: Secondary | ICD-10-CM

## 2013-10-18 DIAGNOSIS — I509 Heart failure, unspecified: Secondary | ICD-10-CM

## 2013-10-18 DIAGNOSIS — E119 Type 2 diabetes mellitus without complications: Secondary | ICD-10-CM

## 2013-10-18 NOTE — Progress Notes (Signed)
Patient ID: Riley Martinez, male   DOB: 28-Sep-1914, 80 y.o.   MRN: [id number]  Location: [facility] Provider:  Candelaria Celeste, NP.  Code Status: DNR  Chief Complaint  Patient presents with  . Medical Management of Chronic Issues    HPI:  80 year old male with a PMH of dementia, HTN, BPH (s/p TURP) GERD who is being seen today for routine follow up; doing well per staff without concerns. In the last month pt went to urologist who DC'd foley catheter after successful voiding trail. conts on medications for BPH and has incontinence but able to void without difficulty. Now being treated for uti by urology.  Review of Systems:  Review of Systems  Constitutional: Negative for fever, chills, weight loss and malaise/fatigue.  HENT: Negative for congestion and sore throat.   Respiratory: Negative for cough and shortness of breath.   Cardiovascular: Negative for chest pain.  Gastrointestinal: Negative for abdominal pain, diarrhea and constipation.  Genitourinary: Negative for dysuria.  Musculoskeletal: Negative for falls and myalgias.  Skin: Negative.   Neurological: Negative for dizziness and headaches.  Psychiatric/Behavioral: Positive for memory loss. Negative for depression. The patient is not nervous/anxious and does not have insomnia.     Medications: Patient's Medications  New Prescriptions   No medications on file  Previous Medications   ACETAMINOPHEN (TYLENOL) 500 MG TABLET    Take 1,000 mg by mouth every 6 (six) hours as needed for mild pain.    ALBUTEROL (PROVENTIL) (2.5 MG/3ML) 0.083% NEBULIZER SOLUTION    Take 2.5 mg by nebulization every 8 (eight) hours as needed for wheezing or shortness of breath.    ASPIRIN 81 MG TABLET    Take 81 mg by mouth daily.   BISACODYL (DULCOLAX) 10 MG SUPPOSITORY    Place 10 mg rectally daily. If constipation is not relieved by by MOM give 10 mg Bisacodyl suppository rectally x 1 dose in 24 hours as needed   CARBAMIDE PEROXIDE (DEBROX) 6.5 % OTIC  SOLUTION    Place 5 drops into both ears daily as needed (cerum impaction).    CARVEDILOL (COREG) 3.125 MG TABLET    Take 3.125 mg by mouth 2 (two) times daily with a meal.   CLOTRIMAZOLE (LOTRIMIN) 1 % CREAM    Apply 1 application topically 2 (two) times daily.   DESONIDE (DESOWEN) 0.05 % LOTION    Apply 1 application topically 2 (two) times daily. Monday - Thursday - to lower legs   FEEDING SUPPLEMENT, ENSURE, (ENSURE) PUDG    Take 1 Container by mouth 3 (three) times daily between meals.   FINASTERIDE (PROSCAR) 5 MG TABLET    Take 5 mg by mouth daily.   FLUOCINONIDE CREAM (LIDEX) 0.05 %    Apply 1 application topically 2 (two) times daily. Monday through Thursday to legs   FLUTICASONE (FLONASE) 50 MCG/ACT NASAL SPRAY    Place 1 spray into both nostrils daily.   LISINOPRIL (PRINIVIL,ZESTRIL) 5 MG TABLET    1/2 tablet by mouth daily   MIRTAZAPINE (REMERON) 15 MG TABLET    Take 15 mg by mouth at bedtime.   OMEPRAZOLE (PRILOSEC) 20 MG CAPSULE    Take 20 mg by mouth daily.   POLYETHYLENE GLYCOL (MIRALAX / GLYCOLAX) PACKET    Take 17 g by mouth daily as needed for moderate constipation.    POLYVINYL ALCOHOL (LIQUIFILM TEARS) 1.4 % OPHTHALMIC SOLUTION    Place 2 drops into both eyes 3 (three) times daily. For dry eyes   PROMETHAZINE (  PHENERGAN) 25 MG TABLET    Take 25 mg by mouth every 6 (six) hours as needed for nausea or vomiting.   SENNOSIDES-DOCUSATE SODIUM (SENOKOT-S) 8.6-50 MG TABLET    Take 2 tablets by mouth 2 (two) times daily.   TAMSULOSIN (FLOMAX) 0.4 MG CAPS CAPSULE    Take 0.4 mg by mouth daily after supper.    VITAMIN B-12 (CYANOCOBALAMIN) 1000 MCG TABLET    Take 1,000 mcg by mouth daily. For supplement  Modified Medications   No medications on file  Discontinued Medications   No medications on file    Physical Exam: Filed Vitals:   10/18/13 1135  BP: 99/77  Pulse: 65  Temp: 97.3 F (36.3 C)  Resp: 20   Physical Exam  Constitutional: He is well-developed, well-nourished,  and in no distress. No distress.  HENT:  Head: Normocephalic and atraumatic.  Right Ear: External ear normal.  Left Ear: External ear normal.  Nose: Nose normal.  Mouth/Throat: Oropharynx is clear and moist. No oropharyngeal exudate.  Eyes: Conjunctivae and EOM are normal. Pupils are equal, round, and reactive to light.  Neck: Normal range of motion. Neck supple.  Cardiovascular: Normal rate, regular rhythm and normal heart sounds.   Pulmonary/Chest: Effort normal and breath sounds normal.  Abdominal: Soft. Bowel sounds are normal. He exhibits no distension. There is no tenderness.  Musculoskeletal: He exhibits no edema and no tenderness.  Neurological: He is alert.  Skin: Skin is warm and dry.  Psychiatric: He expresses impulsivity. He exhibits disordered thought content and abnormal recent memory.     Labs reviewed: Basic Metabolic Panel:  Recent Labs  13/24/4001/02/09 0455  05/11/13 0415 05/12/13 1017 05/14/13 0350 10/04/13  NA 148*  --  144  --  146 139  K 3.6*  < > 3.5*  --  3.5* 4.7  CL 111  --  105  --  107  --   CO2 25  --  27  --  27  --   GLUCOSE 154*  --  124*  --  103*  --   BUN 22  --  12  --  11 27*  CREATININE 1.11  --  0.87 0.73 0.91 0.8  CALCIUM 8.0*  --  8.3*  --  8.4  --   < > = values in this interval not displayed.  Liver Function Tests:  Recent Labs  05/05/13 1823 05/06/13 0430  AST 11 11  ALT 14 14  ALKPHOS 34* 32*  BILITOT 0.8 0.8  PROT 6.1 6.2  ALBUMIN 2.3* 2.3*    CBC:  Recent Labs  05/05/13 1823 05/06/13 0430 05/08/13 0455 05/11/13 0415 05/14/13 0350 10/04/13  WBC 8.7 8.7 7.2 6.4 8.9 8.5  NEUTROABS 7.3 6.7  --   --   --   --   HGB 8.1* 8.5* 7.8* 8.0* 9.3* 9.8*  HCT 25.7* 27.5* 25.4* 25.8* 30.4* 31*  MCV 67.3* 67.4* 67.2* 66.3* 67.1*  --   PLT 252 244 248 217 265 198    Lab Results  Component Value Date   HGBA1C 6.6* 08/28/2013    Assessment/Plan 1. BPH (benign prostatic hyperplasia) -foley has been dc'd and doing  well -conts on proscar and flomax   2. Diabetes mellitus without complication Not on medication, diet controlled with A1c of 6.6  3. Constipation, unspecified constipation type -without problems at this time  4. Essential hypertension Stable on current medications  5. Chronic systolic CHF (congestive heart failure) Stable at this time.

## 2013-11-18 ENCOUNTER — Encounter: Payer: Self-pay | Admitting: Internal Medicine

## 2013-11-18 ENCOUNTER — Non-Acute Institutional Stay (SKILLED_NURSING_FACILITY): Payer: Medicare Other | Admitting: Internal Medicine

## 2013-11-18 DIAGNOSIS — I10 Essential (primary) hypertension: Secondary | ICD-10-CM

## 2013-11-18 DIAGNOSIS — D518 Other vitamin B12 deficiency anemias: Secondary | ICD-10-CM

## 2013-11-18 DIAGNOSIS — F03918 Unspecified dementia, unspecified severity, with other behavioral disturbance: Secondary | ICD-10-CM

## 2013-11-18 DIAGNOSIS — E119 Type 2 diabetes mellitus without complications: Secondary | ICD-10-CM

## 2013-11-18 DIAGNOSIS — E870 Hyperosmolality and hypernatremia: Secondary | ICD-10-CM

## 2013-11-18 DIAGNOSIS — F0391 Unspecified dementia with behavioral disturbance: Secondary | ICD-10-CM

## 2013-11-18 DIAGNOSIS — K219 Gastro-esophageal reflux disease without esophagitis: Secondary | ICD-10-CM

## 2013-11-18 DIAGNOSIS — I5022 Chronic systolic (congestive) heart failure: Secondary | ICD-10-CM

## 2013-11-18 DIAGNOSIS — I509 Heart failure, unspecified: Secondary | ICD-10-CM

## 2013-11-18 NOTE — Assessment & Plan Note (Signed)
Controlled on CHF meds as above

## 2013-11-18 NOTE — Assessment & Plan Note (Signed)
A1c  6.6  In 08/2013; on aACE

## 2013-11-18 NOTE — Assessment & Plan Note (Signed)
Continue omeprazole 

## 2013-11-18 NOTE — Assessment & Plan Note (Signed)
When pt doesn't get his way he functions on the level of a 80 yo, but everyone is used to that and in general there are no problems except for yelling

## 2013-11-18 NOTE — Assessment & Plan Note (Signed)
NA 08/2013 was 140

## 2013-11-18 NOTE — Assessment & Plan Note (Signed)
No exacerations;stable no coreg and ACE

## 2013-11-18 NOTE — Progress Notes (Signed)
MRN: [id number] Name: Riley Martinez  Sex: male Age: 80 y.o. DOB: 1914/06/19  [facility] #:[email or web address]  Facility/Room: 204B Level Of Care: SNF Provider: Merrilee Seashore D Emergency Contacts: Extended Emergency Contact Information Primary Emergency Contact: Martinez,Riley Address: [address]          [place], [place] [address] [place] Home Phone: [phone] Relation: Son Secondary Emergency Contact: [facility] Address: [address]           [place], [place] [address] [place] Home Phone: [phone] Relation: Son  Code Status: DNR  Allergies: Ativan  Chief Complaint  Patient presents with  . Medical Management of Chronic Issues    HPI: Patient is 80 y.o. male who is being seen for routine issues.  Past Medical History  Diagnosis Date  . GERD (gastroesophageal reflux disease)   . Hypertension   . BPH (benign prostatic hyperplasia)   . Diabetes mellitus without complication   . Constipation     Past Surgical History  Procedure Laterality Date  . Hip surgery    . Hernia repair        Medication List       This list is accurate as of: 11/18/13  1:17 PM.  Always use your most recent med list.               acetaminophen 500 MG tablet  Commonly known as:  TYLENOL  Take 1,000 mg by mouth every 6 (six) hours as needed for mild pain.     albuterol (2.5 MG/3ML) 0.083% nebulizer solution  Commonly known as:  PROVENTIL  Take 2.5 mg by nebulization every 8 (eight) hours as needed for wheezing or shortness of breath.     aspirin 81 MG tablet  Take 81 mg by mouth daily.     bisacodyl 10 MG suppository  Commonly known as:  DULCOLAX  Place 10 mg rectally daily. If constipation is not relieved by by MOM give 10 mg Bisacodyl suppository rectally x 1 dose in 24 hours as needed     carbamide peroxide 6.5 % otic solution  Commonly known as:  DEBROX  Place 5 drops into both ears daily as needed (cerum impaction).     carvedilol 3.125 MG  tablet  Commonly known as:  COREG  Take 3.125 mg by mouth 2 (two) times daily with a meal.     clotrimazole 1 % cream  Commonly known as:  LOTRIMIN  Apply 1 application topically 2 (two) times daily.     desonide 0.05 % lotion  Commonly known as:  DESOWEN  Apply 1 application topically 2 (two) times daily. Monday - Thursday - to lower legs     feeding supplement (ENSURE) Pudg  Take 1 Container by mouth 3 (three) times daily between meals.     finasteride 5 MG tablet  Commonly known as:  PROSCAR  Take 5 mg by mouth daily.     fluocinonide cream 0.05 %  Commonly known as:  LIDEX  Apply 1 application topically 2 (two) times daily. Monday through Thursday to legs     fluticasone 50 MCG/ACT nasal spray  Commonly known as:  FLONASE  Place 1 spray into both nostrils daily.     lisinopril 5 MG tablet  Commonly known as:  PRINIVIL,ZESTRIL  1/2 tablet by mouth daily     mirtazapine 15 MG tablet  Commonly known as:  REMERON  Take 15 mg by mouth at bedtime.     omeprazole 20 MG capsule  Commonly known as:  PRILOSEC  Take 20 mg by mouth daily.     polyethylene glycol packet  Commonly known as:  MIRALAX / GLYCOLAX  Take 17 g by mouth daily as needed for moderate constipation.     polyvinyl alcohol 1.4 % ophthalmic solution  Commonly known as:  LIQUIFILM TEARS  Place 2 drops into both eyes 3 (three) times daily. For dry eyes     promethazine 25 MG tablet  Commonly known as:  PHENERGAN  Take 25 mg by mouth every 6 (six) hours as needed for nausea or vomiting.     sennosides-docusate sodium 8.6-50 MG tablet  Commonly known as:  SENOKOT-S  Take 2 tablets by mouth 2 (two) times daily.     tamsulosin 0.4 MG Caps capsule  Commonly known as:  FLOMAX  Take 0.4 mg by mouth daily after supper.     vitamin B-12 1000 MCG tablet  Commonly known as:  CYANOCOBALAMIN  Take 1,000 mcg by mouth daily. For supplement        No orders of the defined types were placed in this  encounter.    Immunization History  Administered Date(s) Administered  . PPD Test 05/15/2013    History  Substance Use Topics  . Smoking status: Former Games developer  . Smokeless tobacco: Never Used  . Alcohol Use: No     Comment: occasionally has not had for years.    Review of Systems    UTO -pt may have been able but he was interested in doing that   Filed Vitals:   11/18/13 1300  BP: 136/75  Pulse: 72  Temp: 97.6 F (36.4 C)  Resp: 20    Physical Exam  GENERAL APPEARANCE: Alert, conversant. Appropriately groomed. No acute distress  SKIN: No diaphoresis rash HEENT: Unremarkable RESPIRATORY: Breathing is even, unlabored. Lung sounds are clear   CARDIOVASCULAR: Heart RRR no murmurs, rubs or gallops. No peripheral edema  GASTROINTESTINAL: Abdomen is soft, non-tender, not distended w/ normal bowel sounds.  GENITOURINARY: Bladder non tender, not distended  MUSCULOSKELETAL: No abnormal joints or musculature NEUROLOGIC: Cranial nerves 2-12 grossly intact. Moves all extremities no tremor. PSYCHIATRIC: dementia behavioral issues; sherry took his extension cord which is illegal in SNF and he had a temper tantrum, yelling out repeadedly  Patient Active Problem List   Diagnosis Date Noted  . Dementia with behavioral disturbance 11/18/2013  . FTT (failure to thrive) in adult 06/03/2013  . Chronic systolic CHF (congestive heart failure) 05/16/2013  . Status post PICC central line placement 05/16/2013  . Fungemia 05/09/2013  . Acute low back pain 05/09/2013  . Acute encephalopathy 05/05/2013  . Hypernatremia 05/05/2013  . Fever 05/05/2013  . Anemia 05/05/2013  . Influenza 05/03/2013  . Dysphagia 05/03/2013  . Bacteremia due to Enterococcus 05/03/2013  . GERD (gastroesophageal reflux disease)   . Hypertension   . BPH (benign prostatic hyperplasia)   . Diabetes mellitus without complication   . Constipation     CBC    Component Value Date/Time   WBC 8.5 10/04/2013   WBC  8.9 05/14/2013 0350   RBC 4.53 05/14/2013 0350   HGB 9.8* 10/04/2013   HCT 31* 10/04/2013   PLT 198 10/04/2013   MCV 67.1* 05/14/2013 0350   LYMPHSABS 1.0 05/06/2013 0430   MONOABS 0.7 05/06/2013 0430   EOSABS 0.3 05/06/2013 0430   BASOSABS 0.0 05/06/2013 0430    CMP     Component Value Date/Time   NA 139 10/04/2013   NA 146  05/14/2013 0350   K 4.7 10/04/2013   CL 107 05/14/2013 0350   CO2 27 05/14/2013 0350   GLUCOSE 103* 05/14/2013 0350   BUN 27* 10/04/2013   BUN 11 05/14/2013 0350   CREATININE 0.8 10/04/2013   CREATININE 0.91 05/14/2013 0350   CALCIUM 8.4 05/14/2013 0350   PROT 6.2 05/06/2013 0430   ALBUMIN 2.3* 05/06/2013 0430   AST 11 05/06/2013 0430   ALT 14 05/06/2013 0430   ALKPHOS 32* 05/06/2013 0430   BILITOT 0.8 05/06/2013 0430   GFRNONAA 69* 05/14/2013 0350   GFRAA 80* 05/14/2013 0350    Assessment and Plan  Chronic systolic CHF (congestive heart failure) No exacerations;stable no coreg and ACE  Hypertension Controlled on CHF meds as above  GERD (gastroesophageal reflux disease) Continue omeprazole  Diabetes mellitus without complication A1c  6.6  In 08/2013; on aACE  Dementia with behavioral disturbance When pt doesn't get his way he functions on the level of a 80 yo, but everyone is used to that and in general there are no problems except for yelling  Anemia Hb stable as of July 2015; iron studies in June were normal  Hypernatremia NA 08/2013 was 140    Margit Hanks, MD

## 2013-11-18 NOTE — Assessment & Plan Note (Signed)
Hb stable as of July 2015; iron studies in June were normal

## 2013-11-19 ENCOUNTER — Ambulatory Visit (HOSPITAL_COMMUNITY): Payer: Medicare Other

## 2013-12-13 ENCOUNTER — Non-Acute Institutional Stay (SKILLED_NURSING_FACILITY): Payer: Medicare Other | Admitting: Nurse Practitioner

## 2013-12-13 DIAGNOSIS — E119 Type 2 diabetes mellitus without complications: Secondary | ICD-10-CM

## 2013-12-13 DIAGNOSIS — I10 Essential (primary) hypertension: Secondary | ICD-10-CM

## 2013-12-13 DIAGNOSIS — R627 Adult failure to thrive: Secondary | ICD-10-CM

## 2013-12-13 DIAGNOSIS — I509 Heart failure, unspecified: Secondary | ICD-10-CM

## 2013-12-13 DIAGNOSIS — F03918 Unspecified dementia, unspecified severity, with other behavioral disturbance: Secondary | ICD-10-CM

## 2013-12-13 DIAGNOSIS — I5022 Chronic systolic (congestive) heart failure: Secondary | ICD-10-CM

## 2013-12-13 DIAGNOSIS — K5900 Constipation, unspecified: Secondary | ICD-10-CM

## 2013-12-13 DIAGNOSIS — R1310 Dysphagia, unspecified: Secondary | ICD-10-CM

## 2013-12-13 DIAGNOSIS — F0391 Unspecified dementia with behavioral disturbance: Secondary | ICD-10-CM

## 2013-12-13 NOTE — Progress Notes (Signed)
Patient ID: Riley Martinez, male   DOB: 1914/06/01, 80 y.o.   MRN: [id number]  Location: [facility] Provider:  Candelaria Celeste, NP.  Code Status: DNR  Chief Complaint  Patient presents with  . Medical Management of Chronic Issues    HPI:  80 year old male with a PMH of dementia, HTN, BPH (s/p TURP) GERD who is being seen today for routine follow up; doing well per staff without concerns. Pt with good PO intake. Remains coughing with meals. No shortness of breath.   Review of Systems:  Review of Systems  Constitutional: Negative for fever, chills, weight loss and malaise/fatigue.  HENT: Negative for congestion and sore throat.   Respiratory: Negative for cough and shortness of breath.   Cardiovascular: Negative for chest pain.  Gastrointestinal: Negative for abdominal pain, diarrhea and constipation.  Genitourinary: Negative for dysuria.  Musculoskeletal: Negative for falls and myalgias.  Skin: Negative.   Neurological: Negative for dizziness and headaches.  Psychiatric/Behavioral: Positive for memory loss. Negative for depression. The patient is not nervous/anxious and does not have insomnia.     Medications: Patient's Medications  New Prescriptions   No medications on file  Previous Medications   ACETAMINOPHEN (TYLENOL) 500 MG TABLET    Take 1,000 mg by mouth every 6 (six) hours as needed for mild pain.    ALBUTEROL (PROVENTIL) (2.5 MG/3ML) 0.083% NEBULIZER SOLUTION    Take 2.5 mg by nebulization every 8 (eight) hours as needed for wheezing or shortness of breath.    ASPIRIN 81 MG TABLET    Take 81 mg by mouth daily.   BISACODYL (DULCOLAX) 10 MG SUPPOSITORY    Place 10 mg rectally daily. If constipation is not relieved by by MOM give 10 mg Bisacodyl suppository rectally x 1 dose in 24 hours as needed   CARBAMIDE PEROXIDE (DEBROX) 6.5 % OTIC SOLUTION    Place 5 drops into both ears daily as needed (cerum impaction).    CARVEDILOL (COREG) 3.125 MG TABLET    Take 3.125 mg by mouth 2  (two) times daily with a meal.   CLOTRIMAZOLE (LOTRIMIN) 1 % CREAM    Apply 1 application topically 2 (two) times daily.   DESONIDE (DESOWEN) 0.05 % LOTION    Apply 1 application topically 2 (two) times daily. Monday - Thursday - to lower legs   FEEDING SUPPLEMENT, ENSURE, (ENSURE) PUDG    Take 1 Container by mouth 3 (three) times daily between meals.   FINASTERIDE (PROSCAR) 5 MG TABLET    Take 5 mg by mouth daily.   FLUOCINONIDE CREAM (LIDEX) 0.05 %    Apply 1 application topically 2 (two) times daily. Monday through Thursday to legs   FLUTICASONE (FLONASE) 50 MCG/ACT NASAL SPRAY    Place 1 spray into both nostrils daily.   LISINOPRIL (PRINIVIL,ZESTRIL) 5 MG TABLET    1/2 tablet by mouth daily   MIRTAZAPINE (REMERON) 15 MG TABLET    Take 15 mg by mouth at bedtime.   OMEPRAZOLE (PRILOSEC) 20 MG CAPSULE    Take 20 mg by mouth daily.   POLYETHYLENE GLYCOL (MIRALAX / GLYCOLAX) PACKET    Take 17 g by mouth daily as needed for moderate constipation.    POLYVINYL ALCOHOL (LIQUIFILM TEARS) 1.4 % OPHTHALMIC SOLUTION    Place 2 drops into both eyes 3 (three) times daily. For dry eyes   PROMETHAZINE (PHENERGAN) 25 MG TABLET    Take 25 mg by mouth every 6 (six) hours as needed for nausea or vomiting.  SENNOSIDES-DOCUSATE SODIUM (SENOKOT-S) 8.6-50 MG TABLET    Take 2 tablets by mouth 2 (two) times daily.   TAMSULOSIN (FLOMAX) 0.4 MG CAPS CAPSULE    Take 0.4 mg by mouth daily after supper.    VITAMIN B-12 (CYANOCOBALAMIN) 1000 MCG TABLET    Take 1,000 mcg by mouth daily. For supplement  Modified Medications   No medications on file  Discontinued Medications   No medications on file    Physical Exam: Filed Vitals:   12/13/13 1624  BP: 128/68  Pulse: 87  Temp: 97 F (36.1 C)  Resp: 20  Weight: 166 lb (75.297 kg)   Physical Exam  Constitutional: He is well-developed, well-nourished, and in no distress. No distress.  HENT:  Head: Normocephalic and atraumatic.  Right Ear: External ear normal.    Left Ear: External ear normal.  Nose: Nose normal.  Mouth/Throat: Oropharynx is clear and moist. No oropharyngeal exudate.  Eyes: Conjunctivae and EOM are normal. Pupils are equal, round, and reactive to light.  Neck: Normal range of motion. Neck supple.  Cardiovascular: Normal rate, regular rhythm and normal heart sounds.   Pulmonary/Chest: Effort normal and breath sounds normal.  Abdominal: Soft. Bowel sounds are normal. He exhibits no distension. There is no tenderness.  Musculoskeletal: He exhibits no edema and no tenderness.  Neurological: He is alert.  Skin: Skin is warm and dry.  Psychiatric: He expresses impulsivity. He exhibits disordered thought content and abnormal recent memory.     Labs reviewed: Basic Metabolic Panel:  Recent Labs  16/10/96 0455  05/11/13 0415 05/12/13 1017 05/14/13 0350 10/04/13  NA 148*  --  144  --  146 139  K 3.6*  < > 3.5*  --  3.5* 4.7  CL 111  --  105  --  107  --   CO2 25  --  27  --  27  --   GLUCOSE 154*  --  124*  --  103*  --   BUN 22  --  12  --  11 27*  CREATININE 1.11  --  0.87 0.73 0.91 0.8  CALCIUM 8.0*  --  8.3*  --  8.4  --   < > = values in this interval not displayed.  Liver Function Tests:  Recent Labs  05/05/13 1823 05/06/13 0430  AST 11 11  ALT 14 14  ALKPHOS 34* 32*  BILITOT 0.8 0.8  PROT 6.1 6.2  ALBUMIN 2.3* 2.3*    CBC:  Recent Labs  05/05/13 1823 05/06/13 0430 05/08/13 0455 05/11/13 0415 05/14/13 0350 10/04/13  WBC 8.7 8.7 7.2 6.4 8.9 8.5  NEUTROABS 7.3 6.7  --   --   --   --   HGB 8.1* 8.5* 7.8* 8.0* 9.3* 9.8*  HCT 25.7* 27.5* 25.4* 25.8* 30.4* 31*  MCV 67.3* 67.4* 67.2* 66.3* 67.1*  --   PLT 252 244 248 217 265 198    Lab Results  Component Value Date   HGBA1C 6.6* 08/28/2013    Assessment/Plan  1. Chronic systolic CHF (congestive heart failure) Euvolemic, conts on coreg  2. Essential hypertension Patients blood pressure is stable; continue current regimen. Will monitor and make  changes as necessary.  3. Diabetes mellitus without complication -following via a1c, no medication needed  4. Dysphagia Unchanged    5. Constipation, unspecified constipation type Controlled on current medications  6. Dementia with behavioral disturbance Without significant change in the last month, dementia is advanced, no recent increase in behaviors   7.  FTT (failure to thrive) in adult -pt with positive weight gain

## 2014-01-02 ENCOUNTER — Non-Acute Institutional Stay (SKILLED_NURSING_FACILITY): Payer: Medicare Other | Admitting: Internal Medicine

## 2014-01-02 ENCOUNTER — Encounter: Payer: Self-pay | Admitting: Internal Medicine

## 2014-01-02 DIAGNOSIS — I10 Essential (primary) hypertension: Secondary | ICD-10-CM

## 2014-01-02 DIAGNOSIS — D518 Other vitamin B12 deficiency anemias: Secondary | ICD-10-CM

## 2014-01-02 DIAGNOSIS — F0391 Unspecified dementia with behavioral disturbance: Secondary | ICD-10-CM

## 2014-01-02 DIAGNOSIS — E119 Type 2 diabetes mellitus without complications: Secondary | ICD-10-CM

## 2014-01-02 DIAGNOSIS — I5022 Chronic systolic (congestive) heart failure: Secondary | ICD-10-CM

## 2014-01-02 DIAGNOSIS — R627 Adult failure to thrive: Secondary | ICD-10-CM

## 2014-01-02 DIAGNOSIS — R1310 Dysphagia, unspecified: Secondary | ICD-10-CM

## 2014-01-02 DIAGNOSIS — F03918 Unspecified dementia, unspecified severity, with other behavioral disturbance: Secondary | ICD-10-CM

## 2014-01-02 NOTE — Assessment & Plan Note (Signed)
No new A1c;pt still on ACE

## 2014-01-02 NOTE — Progress Notes (Signed)
MRN: [id number] Name: Riley Martinez  Sex: male Age: 80 y.o. DOB: October 11, 1914  PSC #: [facility] Facility/Room: 204 Level Of Care: SNF Provider: Merrilee Seashore D Emergency Contacts: Extended Emergency Contact Information Primary Emergency Contact: Sequeira,[name] Address: [address]          [place], [place] [address] [place] Home Phone: [phone] Relation: Son Secondary Emergency Contact: [facility] Address: [address]           [place], [place] [address] [place] Home Phone: [phone] Relation: Son  Code Status:DNR   Allergies: Ativan  Chief Complaint  Patient presents with  . Medical Management of Chronic Issues    HPI: Patient is 80 y.o. male who has been doing well and is being seen for routine issues.  Past Medical History  Diagnosis Date  . GERD (gastroesophageal reflux disease)   . Hypertension   . BPH (benign prostatic hyperplasia)   . Diabetes mellitus without complication   . Constipation     Past Surgical History  Procedure Laterality Date  . Hip surgery    . Hernia repair        Medication List       This list is accurate as of: 01/02/14  7:06 PM.  Always use your most recent med list.               acetaminophen 500 MG tablet  Commonly known as:  TYLENOL  Take 1,000 mg by mouth every 6 (six) hours as needed for mild pain.     albuterol (2.5 MG/3ML) 0.083% nebulizer solution  Commonly known as:  PROVENTIL  Take 2.5 mg by nebulization every 8 (eight) hours as needed for wheezing or shortness of breath.     aspirin 81 MG tablet  Take 81 mg by mouth daily.     bisacodyl 10 MG suppository  Commonly known as:  DULCOLAX  Place 10 mg rectally daily. If constipation is not relieved by by MOM give 10 mg Bisacodyl suppository rectally x 1 dose in 24 hours as needed     carbamide peroxide 6.5 % otic solution  Commonly known as:  DEBROX  Place 5 drops into both ears daily as needed (cerum impaction).     carvedilol 3.125 MG tablet  Commonly known as:  COREG  Take 3.125 mg by mouth 2 (two) times daily with a meal.     clotrimazole 1 % cream  Commonly known as:  LOTRIMIN  Apply 1 application topically 2 (two) times daily.     desonide 0.05 % lotion  Commonly known as:  DESOWEN  Apply 1 application topically 2 (two) times daily. Monday - Thursday - to lower legs     feeding supplement (ENSURE) Pudg  Take 1 Container by mouth 3 (three) times daily between meals.     finasteride 5 MG tablet  Commonly known as:  PROSCAR  Take 5 mg by mouth daily.     fluocinonide cream 0.05 %  Commonly known as:  LIDEX  Apply 1 application topically 2 (two) times daily. Monday through Thursday to legs     fluticasone 50 MCG/ACT nasal spray  Commonly known as:  FLONASE  Place 1 spray into both nostrils daily.     lisinopril 5 MG tablet  Commonly known as:  PRINIVIL,ZESTRIL  1/2 tablet by mouth daily     mirtazapine 15 MG tablet  Commonly known as:  REMERON  Take 15 mg by mouth at bedtime.  omeprazole 20 MG capsule  Commonly known as:  PRILOSEC  Take 20 mg by mouth daily.     polyethylene glycol packet  Commonly known as:  MIRALAX / GLYCOLAX  Take 17 g by mouth daily as needed for moderate constipation.     polyvinyl alcohol 1.4 % ophthalmic solution  Commonly known as:  LIQUIFILM TEARS  Place 2 drops into both eyes 3 (three) times daily. For dry eyes     promethazine 25 MG tablet  Commonly known as:  PHENERGAN  Take 25 mg by mouth every 6 (six) hours as needed for nausea or vomiting.     sennosides-docusate sodium 8.6-50 MG tablet  Commonly known as:  SENOKOT-S  Take 2 tablets by mouth 2 (two) times daily.     tamsulosin 0.4 MG Caps capsule  Commonly known as:  FLOMAX  Take 0.4 mg by mouth daily after supper.     vitamin B-12 1000 MCG tablet  Commonly known as:  CYANOCOBALAMIN  Take 1,000 mcg by mouth daily. For supplement        No orders of the defined types were  placed in this encounter.    Immunization History  Administered Date(s) Administered  . PPD Test 05/15/2013    History  Substance Use Topics  . Smoking status: Former Games developermoker  . Smokeless tobacco: Never Used  . Alcohol Use: No     Comment: occasionally has not had for years.    Review of Systems    UTO;nursing voice no concerns    Filed Vitals:   01/02/14 1653  BP: 132/58  Pulse: 85  Temp: 98.8 F (37.1 C)  Resp: 18    Physical Exam  GENERAL APPEARANCE: Alert,mod conversant, No acute distress  SKIN: No diaphoresis rash, or wounds HEENT: Unremarkable RESPIRATORY: Breathing is even, unlabored. Lung sounds are diffusely dec but clear   CARDIOVASCULAR: Heart RRR no murmurs, rubs or gallops. No peripheral edema  GASTROINTESTINAL: Abdomen is soft, non-tender, not distended w/ normal bowel sounds.  GENITOURINARY: Bladder non tender, not distended  MUSCULOSKELETAL: No abnormal joints or musculature NEUROLOGIC: Cranial nerves 2-12 grossly intact. Moves all extremities PSYCHIATRIC: dementia  Patient Active Problem List   Diagnosis Date Noted  . Dementia with behavioral disturbance 11/18/2013  . FTT (failure to thrive) in adult 06/03/2013  . Chronic systolic CHF (congestive heart failure) 05/16/2013  . Status post PICC central line placement 05/16/2013  . Fungemia 05/09/2013  . Acute low back pain 05/09/2013  . Acute encephalopathy 05/05/2013  . Hypernatremia 05/05/2013  . Fever 05/05/2013  . Anemia 05/05/2013  . Influenza 05/03/2013  . Dysphagia 05/03/2013  . Bacteremia due to Enterococcus 05/03/2013  . GERD (gastroesophageal reflux disease)   . Hypertension   . BPH (benign prostatic hyperplasia)   . Diabetes mellitus type 2, controlled, without complications   . Constipation     CBC    Component Value Date/Time   WBC 8.5 10/04/2013   WBC 8.9 05/14/2013 0350   RBC 4.53 05/14/2013 0350   HGB 9.8* 10/04/2013   HCT 31* 10/04/2013   PLT 198 10/04/2013   MCV 67.1*  05/14/2013 0350   LYMPHSABS 1.0 05/06/2013 0430   MONOABS 0.7 05/06/2013 0430   EOSABS 0.3 05/06/2013 0430   BASOSABS 0.0 05/06/2013 0430    CMP     Component Value Date/Time   NA 139 10/04/2013   NA 146 05/14/2013 0350   K 4.7 10/04/2013   CL 107 05/14/2013 0350   CO2 27 05/14/2013 0350  GLUCOSE 103* 05/14/2013 0350   BUN 27* 10/04/2013   BUN 11 05/14/2013 0350   CREATININE 0.8 10/04/2013   CREATININE 0.91 05/14/2013 0350   CALCIUM 8.4 05/14/2013 0350   PROT 6.2 05/06/2013 0430   ALBUMIN 2.3* 05/06/2013 0430   AST 11 05/06/2013 0430   ALT 14 05/06/2013 0430   ALKPHOS 32* 05/06/2013 0430   BILITOT 0.8 05/06/2013 0430   GFRNONAA 69* 05/14/2013 0350   GFRAA 80* 05/14/2013 0350    Assessment and Plan  FTT (failure to thrive) in adult Pt has been stable and has had some wight gain;participates in some activities outside his room  Anemia Improved from prior with vitamin B12 supplements  Dementia with behavioral disturbance No behavoirs today;pt was semi pleasant;dementia is not visibly worsening at this time  Diabetes mellitus type 2, controlled, without complications No new A1c;pt still on ACE  Dysphagia Always at risk for aspiration, drinking thin liquids as comfort, no PNA's  Chronic systolic CHF (congestive heart failure) On bblocker and ACE, no diuretics;stable  Hypertension Controlled on low dose coreg and Lisinoprol    Margit Hanks, MD

## 2014-01-02 NOTE — Assessment & Plan Note (Signed)
No behavoirs today;pt was semi pleasant;dementia is not visibly worsening at this time

## 2014-01-02 NOTE — Assessment & Plan Note (Signed)
Controlled on low dose coreg and Lisinoprol

## 2014-01-02 NOTE — Assessment & Plan Note (Signed)
Pt has been stable and has had some wight gain;participates in some activities outside his room

## 2014-01-02 NOTE — Assessment & Plan Note (Signed)
Always at risk for aspiration, drinking thin liquids as comfort, no PNA's

## 2014-01-02 NOTE — Assessment & Plan Note (Signed)
On bblocker and ACE, no diuretics;stable

## 2014-01-02 NOTE — Assessment & Plan Note (Signed)
Improved from prior with vitamin B12 supplements

## 2014-02-18 ENCOUNTER — Non-Acute Institutional Stay (SKILLED_NURSING_FACILITY): Payer: Medicare Other | Admitting: Nurse Practitioner

## 2014-02-18 DIAGNOSIS — F0391 Unspecified dementia with behavioral disturbance: Secondary | ICD-10-CM

## 2014-02-18 DIAGNOSIS — I10 Essential (primary) hypertension: Secondary | ICD-10-CM

## 2014-02-18 DIAGNOSIS — F03918 Unspecified dementia, unspecified severity, with other behavioral disturbance: Secondary | ICD-10-CM

## 2014-02-18 DIAGNOSIS — N400 Enlarged prostate without lower urinary tract symptoms: Secondary | ICD-10-CM

## 2014-02-18 DIAGNOSIS — E119 Type 2 diabetes mellitus without complications: Secondary | ICD-10-CM

## 2014-02-18 DIAGNOSIS — K5900 Constipation, unspecified: Secondary | ICD-10-CM

## 2014-02-18 DIAGNOSIS — I5022 Chronic systolic (congestive) heart failure: Secondary | ICD-10-CM

## 2014-02-18 DIAGNOSIS — D649 Anemia, unspecified: Secondary | ICD-10-CM

## 2014-02-18 NOTE — Progress Notes (Signed)
Patient ID: Riley Martinez, male   DOB: 1914/05/09, 80 y.o.   MRN: [id number]    Nursing Home Location:  [place] of Service: SNF (31)  PCP: No primary care provider on file.  Allergies  Allergen Reactions  . Ativan [Lorazepam] Other (See Comments)    unknown    Chief Complaint  Patient presents with  . Medical Management of Chronic Issues    HPI:  Patient is a 80 y.o. male seen today at [place], for routine follow up on chronic conditions. Pt with  a PMH of dementia, HTN, BPH (s/p TURP) GERD. Pt has had some cough and congestion over the weekend but this has started to improve. Many residents have had a "cold" per staff. Pt repots he is doing well.   Review of Systems:  Review of Systems  Constitutional: Negative for activity change, appetite change, fatigue and unexpected weight change.  HENT: Positive for congestion and rhinorrhea. Negative for hearing loss.   Eyes: Negative.   Respiratory: Negative for cough and shortness of breath.   Cardiovascular: Negative for chest pain, palpitations and leg swelling.  Gastrointestinal: Negative for abdominal pain, diarrhea and constipation.  Genitourinary: Negative for dysuria and difficulty urinating.  Musculoskeletal: Negative for myalgias and arthralgias.  Skin: Negative for color change and wound.  Neurological: Negative for dizziness and weakness.  Psychiatric/Behavioral: Positive for confusion (poor memory). Negative for behavioral problems and agitation.    Past Medical History  Diagnosis Date  . GERD (gastroesophageal reflux disease)   . Hypertension   . BPH (benign prostatic hyperplasia)   . Diabetes mellitus without complication   . Constipation    Past Surgical History  Procedure Laterality Date  . Hip surgery    . Hernia repair     Social History:   reports that he has quit smoking. He has never used smokeless tobacco. He reports that he does not drink alcohol or use  illicit drugs.  Family History  Problem Relation Age of Onset  . Diabetes Mellitus II Mother     Medications: Patient's Medications  New Prescriptions   No medications on file  Previous Medications   ACETAMINOPHEN (TYLENOL) 500 MG TABLET    Take 1,000 mg by mouth every 6 (six) hours as needed for mild pain.    ALBUTEROL (PROVENTIL) (2.5 MG/3ML) 0.083% NEBULIZER SOLUTION    Take 2.5 mg by nebulization every 8 (eight) hours as needed for wheezing or shortness of breath.    ASPIRIN 81 MG TABLET    Take 81 mg by mouth daily.   BISACODYL (DULCOLAX) 10 MG SUPPOSITORY    Place 10 mg rectally daily. If constipation is not relieved by by MOM give 10 mg Bisacodyl suppository rectally x 1 dose in 24 hours as needed   CARBAMIDE PEROXIDE (DEBROX) 6.5 % OTIC SOLUTION    Place 5 drops into both ears daily as needed (cerum impaction).    CARVEDILOL (COREG) 3.125 MG TABLET    Take 3.125 mg by mouth 2 (two) times daily with a meal.   CLOTRIMAZOLE (LOTRIMIN) 1 % CREAM    Apply 1 application topically 2 (two) times daily.   DESONIDE (DESOWEN) 0.05 % LOTION    Apply 1 application topically 2 (two) times daily. Monday - Thursday - to lower legs   FEEDING SUPPLEMENT, ENSURE, (ENSURE) PUDG    Take 1 Container by mouth 3 (three) times daily between meals.   FINASTERIDE (PROSCAR) 5 MG TABLET  Take 5 mg by mouth daily.   FLUOCINONIDE CREAM (LIDEX) 0.05 %    Apply 1 application topically 2 (two) times daily. Monday through Thursday to legs   FLUTICASONE (FLONASE) 50 MCG/ACT NASAL SPRAY    Place 1 spray into both nostrils daily.   LISINOPRIL (PRINIVIL,ZESTRIL) 5 MG TABLET    1/2 tablet by mouth daily   MIRTAZAPINE (REMERON) 15 MG TABLET    Take 15 mg by mouth at bedtime.   OMEPRAZOLE (PRILOSEC) 20 MG CAPSULE    Take 20 mg by mouth daily.   POLYETHYLENE GLYCOL (MIRALAX / GLYCOLAX) PACKET    Take 17 g by mouth daily as needed for moderate constipation.    POLYVINYL ALCOHOL (LIQUIFILM TEARS) 1.4 % OPHTHALMIC SOLUTION     Place 2 drops into both eyes 3 (three) times daily. For dry eyes   PROMETHAZINE (PHENERGAN) 25 MG TABLET    Take 25 mg by mouth every 6 (six) hours as needed for nausea or vomiting.   SENNOSIDES-DOCUSATE SODIUM (SENOKOT-S) 8.6-50 MG TABLET    Take 2 tablets by mouth 2 (two) times daily.   TAMSULOSIN (FLOMAX) 0.4 MG CAPS CAPSULE    Take 0.4 mg by mouth daily after supper.    VITAMIN B-12 (CYANOCOBALAMIN) 1000 MCG TABLET    Take 1,000 mcg by mouth daily. For supplement  Modified Medications   No medications on file  Discontinued Medications   No medications on file     Physical Exam: Filed Vitals:   02/18/14 1320  BP: 136/71  Pulse: 80  Temp: 97.7 F (36.5 C)  Resp: 20  Weight: 168 lb (76.204 kg)    Physical Exam  Constitutional: He appears well-developed and well-nourished. No distress.  HENT:  Head: Normocephalic and atraumatic.  Mouth/Throat: Oropharynx is clear and moist. No oropharyngeal exudate.  Eyes: Conjunctivae and EOM are normal. Pupils are equal, round, and reactive to light.  Neck: Normal range of motion. Neck supple.  Cardiovascular: Normal rate, regular rhythm and normal heart sounds.   Pulmonary/Chest: Effort normal and breath sounds normal.  Abdominal: Soft. Bowel sounds are normal.  Musculoskeletal: He exhibits no edema or tenderness.  Neurological: He is alert.  STML  Skin: Skin is warm and dry. He is not diaphoretic.  Psychiatric: He has a normal mood and affect.    Labs reviewed: Basic Metabolic Panel:  Recent Labs  05/08/13 0455  05/11/13 0415 05/12/13 1017 05/14/13 0350 10/04/13  NA 148*  --  144  --  146 139  K 3.6*  < > 3.5*  --  3.5* 4.7  CL 111  --  105  --  107  --   CO2 25  --  27  --  27  --   GLUCOSE 154*  --  124*  --  103*  --   BUN 22  --  12  --  11 27*  CREATININE 1.11  --  0.87 0.73 0.91 0.8  CALCIUM 8.0*  --  8.3*  --  8.4  --   < > = values in this interval not displayed. Liver Function Tests:  Recent Labs   05/05/13 1823 05/06/13 0430  AST 11 11  ALT 14 14  ALKPHOS 34* 32*  BILITOT 0.8 0.8  PROT 6.1 6.2  ALBUMIN 2.3* 2.3*   No results for input(s): LIPASE, AMYLASE in the last 8760 hours. No results for input(s): AMMONIA in the last 8760 hours. CBC:  Recent Labs  05/05/13 1823 05/06/13 0430 05/08/13 0455 05/11/13  6387 05/14/13 0350 10/04/13  WBC 8.7 8.7 7.2 6.4 8.9 8.5  NEUTROABS 7.3 6.7  --   --   --   --   HGB 8.1* 8.5* 7.8* 8.0* 9.3* 9.8*  HCT 25.7* 27.5* 25.4* 25.8* 30.4* 31*  MCV 67.3* 67.4* 67.2* 66.3* 67.1*  --   PLT 252 244 248 217 265 198   TSH:  Recent Labs  05/06/13 0430  TSH 2.037   A1C: Lab Results  Component Value Date   HGBA1C 6.6* 08/28/2013   Lipid Panel: No results for input(s): CHOL, HDL, LDLCALC, TRIG, CHOLHDL, LDLDIRECT in the last 8760 hours.  CBC with Diff    Result: 02/02/2014 6:18 PM   ( Status: F )       WBC 6.6     4.0-10.5 K/uL SLN   RBC 4.94     4.22-5.81 MIL/uL SLN   Hemoglobin 10.1   L 13.0-17.0 g/dL SLN   Hematocrit 32.3   L 39.0-52.0 % SLN   MCV 65.4   L 78.0-100.0 fL SLN   MCH 20.4   L 26.0-34.0 pg SLN   MCHC 31.3     30.0-36.0 g/dL SLN   RDW 16.1   H 11.5-15.5 % SLN   Platelet Count 187     150-400 K/uL SLN   Granulocyte % 58     43-77 % SLN   Absolute Gran 3.8     1.7-7.7 K/uL SLN   Lymph % 26     12-46 % SLN   Absolute Lymph 1.7     0.7-4.0 K/uL SLN   Mono % 12     3-12 % SLN   Absolute Mono 0.8     0.1-1.0 K/uL SLN   Eos % 4     0-5 % SLN   Absolute Eos 0.3     0.0-0.7 K/uL SLN   Baso % 0     0-1 % SLN   Absolute Baso 0.0     0.0-0.1 K/uL SLN   Smear Review Criteria for review not met  SLN   Basic Metabolic Panel    Result: 02/02/2014 4:54 PM   ( Status: F )       Sodium 141     135-145 mEq/L SLN   Potassium 3.8     3.5-5.3 mEq/L SLN   Chloride 107     96-112 mEq/L SLN   CO2 25     19-32 mEq/L SLN   Glucose 138   H 70-99 mg/dL SLN   BUN 22     6-23 mg/dL SLN   Creatinine 0.86     0.50-1.35 mg/dL SLN    Calcium 8.9     8.4-10.5 mg/dL SLN   Assessment/Plan 1. Essential hypertension Controlled at this time.   2. Chronic systolic CHF (congestive heart failure) conts on coreg. No worsening of CHF noted, no recent exacerbations  3. Constipation, unspecified constipation type Controlled on current regimen  4. Diabetes mellitus type 2, controlled, without complications -diet controlled, last A1c was in appropriate range   5. Dementia with behavioral disturbance Advanced, does well in current environment, without acute changes in functional or cognitive status  6. BPH (benign prostatic hyperplasia) Does well on proscar and flomax   7. Anemia, unspecified anemia type conts on b12 for b12 def however now with a microcytic anemia, will add ferrous sulfate BID with meals and follow up cbc in 1 month

## 2014-03-06 ENCOUNTER — Other Ambulatory Visit (HOSPITAL_COMMUNITY): Payer: Self-pay | Admitting: Internal Medicine

## 2014-03-06 ENCOUNTER — Non-Acute Institutional Stay (SKILLED_NURSING_FACILITY): Payer: Medicare Other | Admitting: Internal Medicine

## 2014-03-06 DIAGNOSIS — F03918 Unspecified dementia, unspecified severity, with other behavioral disturbance: Secondary | ICD-10-CM

## 2014-03-06 DIAGNOSIS — F0391 Unspecified dementia with behavioral disturbance: Secondary | ICD-10-CM

## 2014-03-06 DIAGNOSIS — R1310 Dysphagia, unspecified: Secondary | ICD-10-CM

## 2014-03-06 DIAGNOSIS — N3000 Acute cystitis without hematuria: Secondary | ICD-10-CM

## 2014-03-06 DIAGNOSIS — N182 Chronic kidney disease, stage 2 (mild): Secondary | ICD-10-CM

## 2014-03-06 NOTE — Progress Notes (Signed)
MRN: [id number] Name: Riley Martinez  Sex: male Age: 80 y.o. DOB: March 26, 1915  [facility] Facility/Room: 204 Level Of Care: SNF Provider: Merrilee SeashoreALEXANDER, ANNE D Emergency Contacts: Extended Emergency Contact Information Primary Emergency Contact: Roemer,[name] Address: [address]          [place], [place] [id number] [place] Home Phone: [phone] Relation: Son Secondary Emergency Contact: [facility],Mark Address: [address]           [place], [place] [id number] [place] Home Phone: [phone] Relation: Son  Code Status: DNR  Allergies: Ativan  Chief Complaint  Patient presents with  . Acute Visit    HPI: Patient is 80 y.o. male who is being seen for treatment of UTI.  Past Medical History  Diagnosis Date  . GERD (gastroesophageal reflux disease)   . Hypertension   . BPH (benign prostatic hyperplasia)   . Diabetes mellitus without complication   . Constipation     Past Surgical History  Procedure Laterality Date  . Hip surgery    . Hernia repair        Medication List       This list is accurate as of: 03/06/14 11:59 PM.  Always use your most recent med list.               acetaminophen 500 MG tablet  Commonly known as:  TYLENOL  Take 1,000 mg by mouth every 6 (six) hours as needed for mild pain.     albuterol (2.5 MG/3ML) 0.083% nebulizer solution  Commonly known as:  PROVENTIL  Take 2.5 mg by nebulization every 8 (eight) hours as needed for wheezing or shortness of breath.     aspirin 81 MG tablet  Take 81 mg by mouth daily.     bisacodyl 10 MG suppository  Commonly known as:  DULCOLAX  Place 10 mg rectally daily. If constipation is not relieved by by MOM give 10 mg Bisacodyl suppository rectally x 1 dose in 24 hours as needed     carbamide peroxide 6.5 % otic solution  Commonly known as:  DEBROX  Place 5 drops into both ears daily as needed (cerum impaction).     carvedilol 3.125 MG tablet  Commonly  known as:  COREG  Take 3.125 mg by mouth 2 (two) times daily with a meal.     clotrimazole 1 % cream  Commonly known as:  LOTRIMIN  Apply 1 application topically 2 (two) times daily.     desonide 0.05 % lotion  Commonly known as:  DESOWEN  Apply 1 application topically 2 (two) times daily. Monday - Thursday - to lower legs     feeding supplement (ENSURE) Pudg  Take 1 Container by mouth 3 (three) times daily between meals.     finasteride 5 MG tablet  Commonly known as:  PROSCAR  Take 5 mg by mouth daily.     fluocinonide cream 0.05 %  Commonly known as:  LIDEX  Apply 1 application topically 2 (two) times daily. Monday through Thursday to legs     fluticasone 50 MCG/ACT nasal spray  Commonly known as:  FLONASE  Place 1 spray into both nostrils daily.     lisinopril 5 MG tablet  Commonly known as:  PRINIVIL,ZESTRIL  1/2 tablet by mouth daily     mirtazapine 15 MG tablet  Commonly known as:  REMERON  Take 15 mg by mouth at bedtime.     omeprazole 20 MG capsule  Commonly known  as:  PRILOSEC  Take 20 mg by mouth daily.     polyethylene glycol packet  Commonly known as:  MIRALAX / GLYCOLAX  Take 17 g by mouth daily as needed for moderate constipation.     polyvinyl alcohol 1.4 % ophthalmic solution  Commonly known as:  LIQUIFILM TEARS  Place 2 drops into both eyes 3 (three) times daily. For dry eyes     promethazine 25 MG tablet  Commonly known as:  PHENERGAN  Take 25 mg by mouth every 6 (six) hours as needed for nausea or vomiting.     sennosides-docusate sodium 8.6-50 MG tablet  Commonly known as:  SENOKOT-S  Take 2 tablets by mouth 2 (two) times daily.     tamsulosin 0.4 MG Caps capsule  Commonly known as:  FLOMAX  Take 0.4 mg by mouth daily after supper.     vitamin B-12 1000 MCG tablet  Commonly known as:  CYANOCOBALAMIN  Take 1,000 mcg by mouth daily. For supplement        No orders of the defined types were placed in this encounter.    Immunization  History  Administered Date(s) Administered  . PPD Test 05/15/2013    History  Substance Use Topics  . Smoking status: Former Games developer  . Smokeless tobacco: Never Used  . Alcohol Use: No     Comment: occasionally has not had for years.    Review of Systems  Nursing voices no concerns; pt unable much but no apparent pain or fever    Filed Vitals:   03/06/14 0923  BP: 112/72  Pulse: 69  Temp: 98.3 F (36.8 C)  Resp: 18    Physical Exam  GENERAL APPEARANCE: Alert, mod conversant, No acute distress  SKIN: No diaphoresis rash HEENT: Unremarkable RESPIRATORY: Breathing is even, unlabored. Lung sounds are clear   CARDIOVASCULAR: Heart RRR no murmurs, rubs or gallops. No peripheral edema  GASTROINTESTINAL: Abdomen is soft, non-tender, not distended w/ normal bowel sounds.  GENITOURINARY: Bladder non tender, not distended  MUSCULOSKELETAL: No abnormal joints or musculature NEUROLOGIC: Cranial nerves 2-12 grossly intact.  PSYCHIATRIC: dementia, grumpy, no behavioral issues  Patient Active Problem List   Diagnosis Date Noted  . UTI (urinary tract infection) 03/11/2014  . CKD (chronic kidney disease) stage 2, GFR 60-89 ml/min 03/11/2014  . Dementia with behavioral disturbance 11/18/2013  . FTT (failure to thrive) in adult 06/03/2013  . Chronic systolic CHF (congestive heart failure) 05/16/2013  . Hypernatremia 05/05/2013  . Anemia 05/05/2013  . Dysphagia 05/03/2013  . GERD (gastroesophageal reflux disease)   . Hypertension   . BPH (benign prostatic hyperplasia)   . Diabetes mellitus type 2, controlled, without complications   . Constipation     CBC    Component Value Date/Time   WBC 8.5 10/04/2013   WBC 8.9 05/14/2013 0350   RBC 4.53 05/14/2013 0350   HGB 9.8* 10/04/2013   HCT 31* 10/04/2013   PLT 198 10/04/2013   MCV 67.1* 05/14/2013 0350   LYMPHSABS 1.0 05/06/2013 0430   MONOABS 0.7 05/06/2013 0430   EOSABS 0.3 05/06/2013 0430   BASOSABS 0.0 05/06/2013 0430     CMP     Component Value Date/Time   NA 139 10/04/2013   NA 146 05/14/2013 0350   K 4.7 10/04/2013   CL 107 05/14/2013 0350   CO2 27 05/14/2013 0350   GLUCOSE 103* 05/14/2013 0350   BUN 27* 10/04/2013   BUN 11 05/14/2013 0350   CREATININE 0.8 10/04/2013  CREATININE 0.91 05/14/2013 0350   CALCIUM 8.4 05/14/2013 0350   PROT 6.2 05/06/2013 0430   ALBUMIN 2.3* 05/06/2013 0430   AST 11 05/06/2013 0430   ALT 14 05/06/2013 0430   ALKPHOS 32* 05/06/2013 0430   BILITOT 0.8 05/06/2013 0430   GFRNONAA 69* 05/14/2013 0350   GFRAA 80* 05/14/2013 0350    Assessment and Plan  UTI (urinary tract infection) Pt's urine with > 100,000 enterobacter cloacae sensitive to multple, including cipro;placed on cipro for 7 days  CKD (chronic kidney disease) stage 2, GFR 60-89 ml/min Pt's CrCl calculated 48;will dose cipro accordingly, 500 mg day 1 then 250 mg daily for 6 more days  Dementia with behavioral disturbance Always a challenge with behavoirs; pt did not appear to be having pain with UTI or much of a mental status change    Margit HanksALEXANDER, ANNE D, MD

## 2014-03-11 ENCOUNTER — Encounter: Payer: Self-pay | Admitting: Internal Medicine

## 2014-03-11 DIAGNOSIS — N182 Chronic kidney disease, stage 2 (mild): Secondary | ICD-10-CM | POA: Insufficient documentation

## 2014-03-11 DIAGNOSIS — N390 Urinary tract infection, site not specified: Secondary | ICD-10-CM | POA: Insufficient documentation

## 2014-03-11 NOTE — Assessment & Plan Note (Signed)
Pt's urine with > 100,000 enterobacter cloacae sensitive to multple, including cipro;placed on cipro for 7 days

## 2014-03-11 NOTE — Assessment & Plan Note (Signed)
Always a challenge with behavoirs; pt did not appear to be having pain with UTI or much of a mental status change

## 2014-03-11 NOTE — Assessment & Plan Note (Signed)
Pt's CrCl calculated 48;will dose cipro accordingly, 500 mg day 1 then 250 mg daily for 6 more days

## 2014-03-18 ENCOUNTER — Ambulatory Visit (HOSPITAL_COMMUNITY)
Admission: RE | Admit: 2014-03-18 | Discharge: 2014-03-18 | Disposition: A | Discharge status: Home / Self Care | Payer: Medicare Other | Source: Ambulatory Visit | Attending: Internal Medicine | Admitting: Internal Medicine

## 2014-03-18 ENCOUNTER — Non-Acute Institutional Stay (SKILLED_NURSING_FACILITY): Payer: Medicare Other | Admitting: Nurse Practitioner

## 2014-03-18 DIAGNOSIS — F03918 Unspecified dementia, unspecified severity, with other behavioral disturbance: Secondary | ICD-10-CM

## 2014-03-18 DIAGNOSIS — N3000 Acute cystitis without hematuria: Secondary | ICD-10-CM

## 2014-03-18 DIAGNOSIS — N182 Chronic kidney disease, stage 2 (mild): Secondary | ICD-10-CM

## 2014-03-18 DIAGNOSIS — Z7982 Long term (current) use of aspirin: Secondary | ICD-10-CM | POA: Insufficient documentation

## 2014-03-18 DIAGNOSIS — I130 Hypertensive heart and chronic kidney disease with heart failure and stage 1 through stage 4 chronic kidney disease, or unspecified chronic kidney disease: Secondary | ICD-10-CM | POA: Diagnosis not present

## 2014-03-18 DIAGNOSIS — E1122 Type 2 diabetes mellitus with diabetic chronic kidney disease: Secondary | ICD-10-CM | POA: Diagnosis not present

## 2014-03-18 DIAGNOSIS — I509 Heart failure, unspecified: Secondary | ICD-10-CM | POA: Insufficient documentation

## 2014-03-18 DIAGNOSIS — E119 Type 2 diabetes mellitus without complications: Secondary | ICD-10-CM

## 2014-03-18 DIAGNOSIS — I10 Essential (primary) hypertension: Secondary | ICD-10-CM

## 2014-03-18 DIAGNOSIS — F0391 Unspecified dementia with behavioral disturbance: Secondary | ICD-10-CM

## 2014-03-18 DIAGNOSIS — R1310 Dysphagia, unspecified: Secondary | ICD-10-CM

## 2014-03-18 DIAGNOSIS — K219 Gastro-esophageal reflux disease without esophagitis: Secondary | ICD-10-CM | POA: Diagnosis not present

## 2014-03-18 DIAGNOSIS — D649 Anemia, unspecified: Secondary | ICD-10-CM

## 2014-03-18 DIAGNOSIS — R05 Cough: Secondary | ICD-10-CM | POA: Insufficient documentation

## 2014-03-18 DIAGNOSIS — Z79899 Other long term (current) drug therapy: Secondary | ICD-10-CM | POA: Insufficient documentation

## 2014-03-18 DIAGNOSIS — N400 Enlarged prostate without lower urinary tract symptoms: Secondary | ICD-10-CM

## 2014-03-18 NOTE — Progress Notes (Signed)
Patient ID: Riley Martinez, male   DOB: [birth year], 80 y.o.   MRN: [id number]    Nursing Home Location:  [place] of Service: SNF (31)  PCP: No primary care provider on file.  Allergies  Allergen Reactions  . Ativan [Lorazepam] Other (See Comments)    unknown    Chief Complaint  Patient presents with  . Medical Management of Chronic Issues    HPI:  Patient is a 80 y.o. male seen today at [facility], for routine follow up on chronic conditions. Pt with  a PMH of dementia, HTN, BPH (s/p TURP) GERD.over the past month pt was seen and treated for UTI by Dr Sheppard Coil.  Had done well with treatment. Son was worried about pts hands being cold and temp of 96.5 per staff. Pt with good pulses, cap refill and warm hands during exam. pts appears to have baseline temp of 96.5 on review of VS. Staff to educate son.    Review of Systems:  Review of Systems  Constitutional: Negative for activity change, appetite change, fatigue and unexpected weight change.  HENT: Negative for congestion, hearing loss and rhinorrhea.   Eyes: Negative.   Respiratory: Negative for cough and shortness of breath.   Cardiovascular: Negative for chest pain, palpitations and leg swelling.  Gastrointestinal: Negative for abdominal pain, diarrhea and constipation.  Genitourinary: Negative for dysuria and difficulty urinating.  Musculoskeletal: Negative for myalgias and arthralgias.  Skin: Negative for color change and wound.  Neurological: Negative for dizziness and weakness.  Psychiatric/Behavioral: Positive for confusion (poor memory). Negative for behavioral problems and agitation.    Past Medical History  Diagnosis Date  . GERD (gastroesophageal reflux disease)   . Hypertension   . BPH (benign prostatic hyperplasia)   . Diabetes mellitus without complication   . Constipation    Past Surgical History  Procedure Laterality Date  . Hip surgery    . Hernia repair       Social History:   reports that he has quit smoking. He has never used smokeless tobacco. He reports that he does not drink alcohol or use illicit drugs.  Family History  Problem Relation Age of Onset  . Diabetes Mellitus II Mother     Medications: Patient's Medications  New Prescriptions   No medications on file  Previous Medications   ACETAMINOPHEN (TYLENOL) 500 MG TABLET    Take 1,000 mg by mouth every 6 (six) hours as needed for mild pain.    ALBUTEROL (PROVENTIL) (2.5 MG/3ML) 0.083% NEBULIZER SOLUTION    Take 2.5 mg by nebulization every 8 (eight) hours as needed for wheezing or shortness of breath.    ASPIRIN 81 MG TABLET    Take 81 mg by mouth daily.   BISACODYL (DULCOLAX) 10 MG SUPPOSITORY    Place 10 mg rectally daily. If constipation is not relieved by by MOM give 10 mg Bisacodyl suppository rectally x 1 dose in 24 hours as needed   CARBAMIDE PEROXIDE (DEBROX) 6.5 % OTIC SOLUTION    Place 5 drops into both ears daily as needed (cerum impaction).    CARVEDILOL (COREG) 3.125 MG TABLET    Take 3.125 mg by mouth 2 (two) times daily with a meal.   CLOTRIMAZOLE (LOTRIMIN) 1 % CREAM    Apply 1 application topically 2 (two) times daily.   DESONIDE (DESOWEN) 0.05 % LOTION    Apply 1 application topically 2 (two) times daily. Monday - Thursday - to lower legs  FEEDING SUPPLEMENT, ENSURE, (ENSURE) PUDG    Take 1 Container by mouth 3 (three) times daily between meals.   FINASTERIDE (PROSCAR) 5 MG TABLET    Take 5 mg by mouth daily.   FLUOCINONIDE CREAM (LIDEX) 0.05 %    Apply 1 application topically 2 (two) times daily. Monday through Thursday to legs   FLUTICASONE (FLONASE) 50 MCG/ACT NASAL SPRAY    Place 1 spray into both nostrils daily.   LISINOPRIL (PRINIVIL,ZESTRIL) 5 MG TABLET    1/2 tablet by mouth daily   MIRTAZAPINE (REMERON) 15 MG TABLET    Take 15 mg by mouth at bedtime.   OMEPRAZOLE (PRILOSEC) 20 MG CAPSULE    Take 20 mg by mouth daily.   POLYETHYLENE GLYCOL (MIRALAX /  GLYCOLAX) PACKET    Take 17 g by mouth daily as needed for moderate constipation.    POLYVINYL ALCOHOL (LIQUIFILM TEARS) 1.4 % OPHTHALMIC SOLUTION    Place 2 drops into both eyes 3 (three) times daily. For dry eyes   PROMETHAZINE (PHENERGAN) 25 MG TABLET    Take 25 mg by mouth every 6 (six) hours as needed for nausea or vomiting.   SENNOSIDES-DOCUSATE SODIUM (SENOKOT-S) 8.6-50 MG TABLET    Take 2 tablets by mouth 2 (two) times daily.   TAMSULOSIN (FLOMAX) 0.4 MG CAPS CAPSULE    Take 0.4 mg by mouth daily after supper.    VITAMIN B-12 (CYANOCOBALAMIN) 1000 MCG TABLET    Take 1,000 mcg by mouth daily. For supplement  Modified Medications   No medications on file  Discontinued Medications   No medications on file     Physical Exam: Filed Vitals:   03/18/14 1547  BP: 120/78  Pulse: 76  Temp: 97.8 F (36.6 C)  Resp: 20  Weight: 169 lb (76.658 kg)    Physical Exam  Constitutional: He appears well-developed and well-nourished. No distress.  HENT:  Head: Normocephalic and atraumatic.  Mouth/Throat: Oropharynx is clear and moist. No oropharyngeal exudate.  Eyes: Conjunctivae and EOM are normal. Pupils are equal, round, and reactive to light.  Neck: Normal range of motion. Neck supple.  Cardiovascular: Normal rate, regular rhythm and normal heart sounds.   Pulmonary/Chest: Effort normal and breath sounds normal.  Abdominal: Soft. Bowel sounds are normal.  Musculoskeletal: He exhibits no edema or tenderness.  Neurological: He is alert.  STML  Skin: Skin is warm and dry. He is not diaphoretic.  Psychiatric: He has a normal mood and affect.    Labs reviewed: Basic Metabolic Panel:  Recent Labs  05/08/13 0455  05/11/13 0415 05/12/13 1017 05/14/13 0350 10/04/13  NA 148*  --  144  --  146 139  K 3.6*  < > 3.5*  --  3.5* 4.7  CL 111  --  105  --  107  --   CO2 25  --  27  --  27  --   GLUCOSE 154*  --  124*  --  103*  --   BUN 22  --  12  --  11 27*  CREATININE 1.11  --  0.87  0.73 0.91 0.8  CALCIUM 8.0*  --  8.3*  --  8.4  --   < > = values in this interval not displayed. Liver Function Tests:  Recent Labs  05/05/13 1823 05/06/13 0430  AST 11 11  ALT 14 14  ALKPHOS 34* 32*  BILITOT 0.8 0.8  PROT 6.1 6.2  ALBUMIN 2.3* 2.3*   No results for input(s):  LIPASE, AMYLASE in the last 8760 hours. No results for input(s): AMMONIA in the last 8760 hours. CBC:  Recent Labs  05/05/13 1823 05/06/13 0430 05/08/13 0455 05/11/13 0415 05/14/13 0350 10/04/13  WBC 8.7 8.7 7.2 6.4 8.9 8.5  NEUTROABS 7.3 6.7  --   --   --   --   HGB 8.1* 8.5* 7.8* 8.0* 9.3* 9.8*  HCT 25.7* 27.5* 25.4* 25.8* 30.4* 31*  MCV 67.3* 67.4* 67.2* 66.3* 67.1*  --   PLT 252 244 248 217 265 198   TSH:  Recent Labs  05/06/13 0430  TSH 2.037   A1C: Lab Results  Component Value Date   HGBA1C 6.6* 08/28/2013   Lipid Panel: No results for input(s): CHOL, HDL, LDLCALC, TRIG, CHOLHDL, LDLDIRECT in the last 8760 hours.  CBC with Diff    Result: 02/02/2014 6:18 PM   ( Status: F )       WBC 6.6     4.0-10.5 K/uL SLN   RBC 4.94     4.22-5.81 MIL/uL SLN   Hemoglobin 10.1   L 13.0-17.0 g/dL SLN   Hematocrit 32.3   L 39.0-52.0 % SLN   MCV 65.4   L 78.0-100.0 fL SLN   MCH 20.4   L 26.0-34.0 pg SLN   MCHC 31.3     30.0-36.0 g/dL SLN   RDW 16.1   H 11.5-15.5 % SLN   Platelet Count 187     150-400 K/uL SLN   Granulocyte % 58     43-77 % SLN   Absolute Gran 3.8     1.7-7.7 K/uL SLN   Lymph % 26     12-46 % SLN   Absolute Lymph 1.7     0.7-4.0 K/uL SLN   Mono % 12     3-12 % SLN   Absolute Mono 0.8     0.1-1.0 K/uL SLN   Eos % 4     0-5 % SLN   Absolute Eos 0.3     0.0-0.7 K/uL SLN   Baso % 0     0-1 % SLN   Absolute Baso 0.0     0.0-0.1 K/uL SLN   Smear Review Criteria for review not met  SLN   Basic Metabolic Panel    Result: 02/02/2014 4:54 PM   ( Status: F )       Sodium 141     135-145 mEq/L SLN   Potassium 3.8     3.5-5.3 mEq/L SLN   Chloride 107     96-112 mEq/L SLN   CO2 25      19-32 mEq/L SLN   Glucose 138   H 70-99 mg/dL SLN   BUN 22     6-23 mg/dL SLN   Creatinine 0.86     0.50-1.35 mg/dL SLN   Calcium 8.9     8.4-10.5 mg/dL SLN   Assessment/Plan 1. Essential hypertension conts on lisinopril coreg and ASA  2. Dysphagia Followed by ST, diet recommendation are regular with nectar thick liquid  3. Diabetes mellitus type 2, controlled, without complications Diet controlled, will monitor  4. Dementia with behavioral disturbance -unchanged, no changes in behaviors, worsening cognitive or functional status noted by staff.   5. BPH (benign prostatic hyperplasia) conts on flomax and proscar  6. CKD (chronic kidney disease) stage 2, GFR 60-89 ml/min BUN/Cr stable  7. Acute cystitis without hematuria Has been treated with cipro for 7 days, no further issues noted  8. Anemia, unspecified anemia type  Was started on iron, CBC scheduled for follow up   9. CHF -remains stable, conts off lasix  -conts on betablocker, and ACE inhibitor, no signs of fluid overload

## 2014-03-18 NOTE — Procedures (Signed)
Objective Swallowing Evaluation: Modified Barium Swallowing Study  Patient Details  Name: Riley Martinez MRN: [id number] Date of Birth: [birth year]  Today's Date: 03/18/2014 Time: 1020-1050 SLP Time Calculation (min) (ACUTE ONLY): 30 min  Past Medical History:  Past Medical History  Diagnosis Date  . GERD (gastroesophageal reflux disease)   . Hypertension   . BPH (benign prostatic hyperplasia)   . Diabetes mellitus without complication   . Constipation    Past Surgical History:  Past Surgical History  Procedure Laterality Date  . Hip surgery    . Hernia repair     HPI:  80 yr old accompanied by his son for outpatient MBS. [facility] for hypernatremia, hypokalemia and found to have pna.  Family reported that pt had FEES at SNF with honey thick liquids recommended.  Results of BSE continued honey thick liquids and Dys 2.  Purpose of today's MBS is for possible upgrade from honey thick and mechanical soft texture.Marland Kitchen.  PMH:  GERD, HTN, DM.      Assessment / Plan / Recommendation Clinical Impression  Dysphagia Diagnosis: Severe pharyngeal phase dysphagia Clinical impression: Pt. exhibited severe pharyngeal dysphagia as evidenced by delayed swallow initiation to valleculae, decreased laryngeal elevation and laryngeal closure with resultant aspiration of thin, nectar and honey thick (slight, intermittent throat clear; virtually silent for gross aspiration).  Mild vallecular and pyriform sinus residue observed due to reduced tongue base retraction and decreased laryngeal elevation.  Aspiration thin was immediate and frank; no significant difference in amount aspiration with nectar and honey.  Pt provided mod-max verbal cues for hard cough/throat clear effective in clearing some aspirates/penetrates. Oral masticaltion, manipulation and transit with solid texture was WFL's.  He is at risk with all liquid consistencies, son present and comprehended results.  SLP recommended  regular texture diet and nectar liquids and cues to cough/throat clear after every 2 bites/sips and requires full supervision to follow strategies.      Treatment Recommendation  Defer treatment plan to SLP at (Comment)    Diet Recommendation Regular;Nectar-thick liquid   Liquid Administration via: Cup Medication Administration: Whole meds with puree Supervision: Patient able to self feed;Full supervision/cueing for compensatory strategies Compensations: Slow rate;Small sips/bites;Clear throat after each swallow;Hard cough after swallow (after every 2 bites/sips) Postural Changes and/or Swallow Maneuvers: Out of bed for meals;Seated upright 90 degrees;Upright 30-60 min after meal    Other  Recommendations Oral Care Recommendations: Oral care BID   Follow Up Recommendations  Skilled Nursing facility    Frequency and Duration        Pertinent Vitals/Pain No pain         Reason for Referral Objectively evaluate swallowing function   Oral Phase Oral Preparation/Oral Phase Oral Phase: WFL   Pharyngeal Phase Pharyngeal Phase Pharyngeal Phase: Impaired Pharyngeal - Honey Pharyngeal - Honey Teaspoon: Delayed swallow initiation;Premature spillage to valleculae;Pharyngeal residue - valleculae;Pharyngeal residue - pyriform sinuses;Reduced tongue base retraction;Reduced laryngeal elevation;Penetration/Aspiration during swallow;Reduced airway/laryngeal closure Penetration/Aspiration details (honey teaspoon): Material enters airway, remains ABOVE vocal cords and not ejected out Pharyngeal - Honey Cup: Pharyngeal residue - valleculae;Pharyngeal residue - pyriform sinuses;Reduced tongue base retraction;Reduced laryngeal elevation;Delayed swallow initiation;Premature spillage to valleculae;Penetration/Aspiration during swallow;Reduced airway/laryngeal closure Penetration/Aspiration details (honey cup): Material enters airway, CONTACTS cords and not ejected out;Material enters airway, passes BELOW  cords without attempt by patient to eject out (silent aspiration) Pharyngeal - Nectar Pharyngeal - Nectar Teaspoon: Pharyngeal residue - valleculae;Pharyngeal residue - pyriform sinuses;Reduced tongue base retraction;Reduced laryngeal elevation;Penetration/Aspiration during swallow;Penetration/Aspiration after  swallow;Reduced airway/laryngeal closure Penetration/Aspiration details (nectar teaspoon): Material enters airway, passes BELOW cords without attempt by patient to eject out (silent aspiration) Pharyngeal - Nectar Cup: Pharyngeal residue - valleculae;Pharyngeal residue - pyriform sinuses;Reduced tongue base retraction;Reduced laryngeal elevation;Penetration/Aspiration during swallow;Penetration/Aspiration after swallow;Reduced airway/laryngeal closure Penetration/Aspiration details (nectar cup): Material enters airway, passes BELOW cords without attempt by patient to eject out (silent aspiration) Pharyngeal - Thin Pharyngeal - Thin Cup: Penetration/Aspiration during swallow;Reduced laryngeal elevation;Pharyngeal residue - valleculae;Pharyngeal residue - pyriform sinuses;Reduced tongue base retraction;Reduced airway/laryngeal closure Penetration/Aspiration details (thin cup): Material enters airway, passes BELOW cords without attempt by patient to eject out (silent aspiration) Pharyngeal - Solids Pharyngeal - Regular: Delayed swallow initiation;Premature spillage to valleculae  Cervical Esophageal Phase    GO    Cervical Esophageal Phase Cervical Esophageal Phase: [facility]    Functional Assessment Tool Used:  (clinical judgement) Functional Limitations: Swallowing Swallow Current Status ([id number]): At least 80 percent but less than 100 percent impaired, limited or restricted Swallow Goal Status [phone]([id number]): At least 80 percent but less than 100 percent impaired, limited or restricted Swallow Discharge Status [phone]([id number]): At least 80 percent but less than 100 percent impaired, limited or restricted     Royce MacadamiaLitaker, Romelle Reiley Willis 03/18/2014, 2:24 PM   Breck CoonsLisa Willis Lonell FaceLitaker M.Ed [employer]SLP Pager [phone]

## 2014-05-12 ENCOUNTER — Non-Acute Institutional Stay (SKILLED_NURSING_FACILITY): Payer: Medicare Other | Admitting: Nurse Practitioner

## 2014-05-12 DIAGNOSIS — N182 Chronic kidney disease, stage 2 (mild): Secondary | ICD-10-CM

## 2014-05-12 DIAGNOSIS — I10 Essential (primary) hypertension: Secondary | ICD-10-CM

## 2014-05-12 DIAGNOSIS — F0391 Unspecified dementia with behavioral disturbance: Secondary | ICD-10-CM

## 2014-05-12 DIAGNOSIS — D649 Anemia, unspecified: Secondary | ICD-10-CM

## 2014-05-12 DIAGNOSIS — K5900 Constipation, unspecified: Secondary | ICD-10-CM

## 2014-05-12 DIAGNOSIS — N400 Enlarged prostate without lower urinary tract symptoms: Secondary | ICD-10-CM

## 2014-05-12 DIAGNOSIS — I5022 Chronic systolic (congestive) heart failure: Secondary | ICD-10-CM

## 2014-05-12 DIAGNOSIS — F03918 Unspecified dementia, unspecified severity, with other behavioral disturbance: Secondary | ICD-10-CM

## 2014-05-12 DIAGNOSIS — E119 Type 2 diabetes mellitus without complications: Secondary | ICD-10-CM

## 2014-05-12 NOTE — Progress Notes (Signed)
Patient ID: Riley Martinez, male   DOB: 31-Jan-1915, 81 y.o.   MRN: [id number]    Nursing Home Location:  [place] of Service: SNF (31)  PCP: No primary care provider on file.  Allergies  Allergen Reactions  . Ativan [Lorazepam] Other (See Comments)    unknown    Chief Complaint  Patient presents with  . Medical Management of Chronic Issues    HPI:  Patient is a 81 y.o. male seen today at [facility], for routine follow up on chronic conditions. Pt with a PMH of dementia, HTN, BPH (s/p TURP) GERD, CHF. Pt has been in his usual state of health without any acute issues over the last month. Pt states he is doing well and has no complaints. Staff reports pt remains at baseline function and does not have any concerns.   Review of Systems:  Review of Systems  Constitutional: Negative for activity change, appetite change, fatigue and unexpected weight change.  HENT: Negative for congestion, hearing loss and rhinorrhea.   Eyes: Negative.   Respiratory: Negative for cough and shortness of breath.   Cardiovascular: Negative for chest pain, palpitations and leg swelling.  Gastrointestinal: Negative for abdominal pain, diarrhea and constipation.  Genitourinary: Negative for dysuria and difficulty urinating.  Musculoskeletal: Negative for myalgias and arthralgias.  Skin: Negative for color change and wound.  Neurological: Negative for dizziness and weakness.  Psychiatric/Behavioral: Positive for confusion (poor memory). Negative for behavioral problems and agitation.    Past Medical History  Diagnosis Date  . GERD (gastroesophageal reflux disease)   . Hypertension   . BPH (benign prostatic hyperplasia)   . Diabetes mellitus without complication   . Constipation    Past Surgical History  Procedure Laterality Date  . Hip surgery    . Hernia repair     Social History:   reports that he has quit smoking. He has never used smokeless tobacco.  He reports that he does not drink alcohol or use illicit drugs.  Family History  Problem Relation Age of Onset  . Diabetes Mellitus II Mother     Medications: Patient's Medications  New Prescriptions   No medications on file  Previous Medications   ACETAMINOPHEN (TYLENOL) 500 MG TABLET    Take 1,000 mg by mouth every 6 (six) hours as needed for mild pain.    ALBUTEROL (PROVENTIL) (2.5 MG/3ML) 0.083% NEBULIZER SOLUTION    Take 2.5 mg by nebulization every 8 (eight) hours as needed for wheezing or shortness of breath.    ASPIRIN 81 MG TABLET    Take 81 mg by mouth daily.   BISACODYL (DULCOLAX) 10 MG SUPPOSITORY    Place 10 mg rectally daily. If constipation is not relieved by by MOM give 10 mg Bisacodyl suppository rectally x 1 dose in 24 hours as needed   CARBAMIDE PEROXIDE (DEBROX) 6.5 % OTIC SOLUTION    Place 5 drops into both ears daily as needed (cerum impaction).    CARVEDILOL (COREG) 3.125 MG TABLET    Take 3.125 mg by mouth 2 (two) times daily with a meal.   CLOTRIMAZOLE (LOTRIMIN) 1 % CREAM    Apply 1 application topically 2 (two) times daily.   DESONIDE (DESOWEN) 0.05 % LOTION    Apply 1 application topically 2 (two) times daily. Monday - Thursday - to lower legs   FEEDING SUPPLEMENT, ENSURE, (ENSURE) PUDG    Take 1 Container by mouth 3 (three) times daily between meals.  FERROUS SULFATE 325 (65 FE) MG TABLET    Take 325 mg by mouth 2 (two) times daily with a meal.   FINASTERIDE (PROSCAR) 5 MG TABLET    Take 5 mg by mouth daily.   FLUOCINONIDE CREAM (LIDEX) 0.05 %    Apply 1 application topically 2 (two) times daily. Monday through Thursday to legs   FLUTICASONE (FLONASE) 50 MCG/ACT NASAL SPRAY    Place 1 spray into both nostrils daily.   LISINOPRIL (PRINIVIL,ZESTRIL) 5 MG TABLET    1/2 tablet by mouth daily   MIRTAZAPINE (REMERON) 15 MG TABLET    Take 15 mg by mouth at bedtime.   OMEPRAZOLE (PRILOSEC) 20 MG CAPSULE    Take 20 mg by mouth daily.   POLYETHYLENE GLYCOL (MIRALAX /  GLYCOLAX) PACKET    Take 17 g by mouth daily as needed for moderate constipation.    POLYVINYL ALCOHOL (LIQUIFILM TEARS) 1.4 % OPHTHALMIC SOLUTION    Place 2 drops into both eyes 3 (three) times daily. For dry eyes   PROMETHAZINE (PHENERGAN) 25 MG TABLET    Take 25 mg by mouth every 6 (six) hours as needed for nausea or vomiting.   SENNOSIDES-DOCUSATE SODIUM (SENOKOT-S) 8.6-50 MG TABLET    Take 2 tablets by mouth 2 (two) times daily.   TAMSULOSIN (FLOMAX) 0.4 MG CAPS CAPSULE    Take 0.4 mg by mouth daily after supper.    VITAMIN B-12 (CYANOCOBALAMIN) 1000 MCG TABLET    Take 1,000 mcg by mouth daily. For supplement  Modified Medications   No medications on file  Discontinued Medications   No medications on file     Physical Exam: Filed Vitals:   05/12/14 1800  BP: 134/74  Pulse: 69  Temp: 96.2 F (35.7 C)  Resp: 20  Weight: 172 lb (78.019 kg)    Physical Exam  Constitutional: He appears well-developed and well-nourished. No distress.  HENT:  Head: Normocephalic and atraumatic.  Mouth/Throat: Oropharynx is clear and moist. No oropharyngeal exudate.  Eyes: Conjunctivae and EOM are normal. Pupils are equal, round, and reactive to light.  Neck: Normal range of motion. Neck supple.  Cardiovascular: Normal rate, regular rhythm and normal heart sounds.   Pulmonary/Chest: Effort normal and breath sounds normal.  Abdominal: Soft. Bowel sounds are normal.  Musculoskeletal: He exhibits no edema or tenderness.  Neurological: He is alert.  STML  Skin: Skin is warm and dry. He is not diaphoretic.  Psychiatric: He has a normal mood and affect.    Labs reviewed: Basic Metabolic Panel:  Recent Labs  05/14/13 0350 10/04/13  NA 146 139  K 3.5* 4.7  CL 107  --   CO2 27  --   GLUCOSE 103*  --   BUN 11 27*  CREATININE 0.91 0.8  CALCIUM 8.4  --    Liver Function Tests: No results for input(s): AST, ALT, ALKPHOS, BILITOT, PROT, ALBUMIN in the last 8760 hours. No results for input(s):  LIPASE, AMYLASE in the last 8760 hours. No results for input(s): AMMONIA in the last 8760 hours. CBC:  Recent Labs  05/14/13 0350 10/04/13  WBC 8.9 8.5  HGB 9.3* 9.8*  HCT 30.4* 31*  MCV 67.1*  --   PLT 265 198   TSH: No results for input(s): TSH in the last 8760 hours. A1C: Lab Results  Component Value Date   HGBA1C 6.6* 08/28/2013   Lipid Panel: No results for input(s): CHOL, HDL, LDLCALC, TRIG, CHOLHDL, LDLDIRECT in the last 8760 hours.  CBC  with Diff    Result: 02/02/2014 6:18 PM   ( Status: F )       WBC 6.6     4.0-10.5 K/uL SLN   RBC 4.94     4.22-5.81 MIL/uL SLN   Hemoglobin 10.1   L 13.0-17.0 g/dL SLN   Hematocrit 32.3   L 39.0-52.0 % SLN   MCV 65.4   L 78.0-100.0 fL SLN   MCH 20.4   L 26.0-34.0 pg SLN   MCHC 31.3     30.0-36.0 g/dL SLN   RDW 16.1   H 11.5-15.5 % SLN   Platelet Count 187     150-400 K/uL SLN   Granulocyte % 58     43-77 % SLN   Absolute Gran 3.8     1.7-7.7 K/uL SLN   Lymph % 26     12-46 % SLN   Absolute Lymph 1.7     0.7-4.0 K/uL SLN   Mono % 12     3-12 % SLN   Absolute Mono 0.8     0.1-1.0 K/uL SLN   Eos % 4     0-5 % SLN   Absolute Eos 0.3     0.0-0.7 K/uL SLN   Baso % 0     0-1 % SLN   Absolute Baso 0.0     0.0-0.1 K/uL SLN   Smear Review Criteria for review not met  SLN   Basic Metabolic Panel    Result: 02/02/2014 4:54 PM   ( Status: F )       Sodium 141     135-145 mEq/L SLN   Potassium 3.8     3.5-5.3 mEq/L SLN   Chloride 107     96-112 mEq/L SLN   CO2 25     19-32 mEq/L SLN   Glucose 138   H 70-99 mg/dL SLN   BUN 22     6-23 mg/dL SLN   Creatinine 0.86     0.50-1.35 mg/dL SLN   Calcium 8.9     8.4-10.5 mg/dL SLN   Assessment/Plan  1. Diabetes mellitus type 2, controlled, without complications Not currently on medications, diet controlled. Will follow up A1c  2. Constipation, unspecified constipation type Without complaints. Stable on current regimen   3. Essential hypertension Controlled on on lisinopril coreg and  ASA  4. Chronic systolic CHF (congestive heart failure) -remains stable-- has been off lasix since pt had poor PO intake, volume status remains stable. conts on betablocker, and ACE inhibitor, no signs of fluid overload  5. Dementia with behavioral disturbance -dementia remains stable, there has been no acute changes in behaviors, worsening cognitive or functional status noted.   6. BPH (benign prostatic hyperplasia) conts on flomax and proscar, urinary status remains stable  7. CKD (chronic kidney disease) stage 2, GFR 60-89 ml/min Good PO intake, currently on ace, will follow up bmp  8. Anemia, unspecified anemia type conts on iron, follow up CBC

## 2014-05-13 LAB — HM DIABETES EYE EXAM

## 2014-06-03 ENCOUNTER — Non-Acute Institutional Stay (SKILLED_NURSING_FACILITY): Payer: Medicare Other | Admitting: Nurse Practitioner

## 2014-06-03 DIAGNOSIS — D649 Anemia, unspecified: Secondary | ICD-10-CM

## 2014-06-03 DIAGNOSIS — K219 Gastro-esophageal reflux disease without esophagitis: Secondary | ICD-10-CM | POA: Diagnosis not present

## 2014-06-03 DIAGNOSIS — I5022 Chronic systolic (congestive) heart failure: Secondary | ICD-10-CM

## 2014-06-03 DIAGNOSIS — I10 Essential (primary) hypertension: Secondary | ICD-10-CM

## 2014-06-03 DIAGNOSIS — N400 Enlarged prostate without lower urinary tract symptoms: Secondary | ICD-10-CM | POA: Diagnosis not present

## 2014-06-03 DIAGNOSIS — F0391 Unspecified dementia with behavioral disturbance: Secondary | ICD-10-CM

## 2014-06-03 DIAGNOSIS — E119 Type 2 diabetes mellitus without complications: Secondary | ICD-10-CM | POA: Diagnosis not present

## 2014-06-03 DIAGNOSIS — F03918 Unspecified dementia, unspecified severity, with other behavioral disturbance: Secondary | ICD-10-CM

## 2014-06-03 NOTE — Progress Notes (Signed)
Patient ID: Riley Martinez, male   DOB: 08-14-1914, 81 y.o.   MRN: [id number]    Nursing Home Location:  [place] of Service: SNF (31)  PCP: No primary care provider on file.  Allergies  Allergen Reactions  . Ativan [Lorazepam] Other (See Comments)    unknown    Chief Complaint  Patient presents with  . Medical Management of Chronic Issues    HPI:  Patient is a 81 y.o. male seen today at [place], for routine follow up on chronic conditions. Pt with a PMH of dementia, HTN, BPH (s/p TURP) GERD, CHF. Pt has been in his usual state of health without any acute issues over the last month. conts with good appetite and PO intake. No acute decline in cognitive or functional status noted.  Review of Systems:  Review of Systems  Constitutional: Negative for activity change, appetite change, fatigue and unexpected weight change.  HENT: Negative for congestion, hearing loss and rhinorrhea.   Eyes: Negative.   Respiratory: Negative for cough and shortness of breath.   Cardiovascular: Negative for chest pain, palpitations and leg swelling.  Gastrointestinal: Negative for abdominal pain, diarrhea and constipation.  Genitourinary: Negative for dysuria and difficulty urinating.  Musculoskeletal: Negative for myalgias and arthralgias.  Skin: Negative for color change and wound.  Neurological: Negative for dizziness and weakness.  Psychiatric/Behavioral: Positive for confusion (poor memory). Negative for behavioral problems and agitation.    Past Medical History  Diagnosis Date  . GERD (gastroesophageal reflux disease)   . Hypertension   . BPH (benign prostatic hyperplasia)   . Diabetes mellitus without complication   . Constipation    Past Surgical History  Procedure Laterality Date  . Hip surgery    . Hernia repair     Social History:   reports that he has quit smoking. He has never used smokeless tobacco. He reports that he does not  drink alcohol or use illicit drugs.  Family History  Problem Relation Age of Onset  . Diabetes Mellitus II Mother     Medications: Patient's Medications  New Prescriptions   No medications on file  Previous Medications   ACETAMINOPHEN (TYLENOL) 500 MG TABLET    Take 1,000 mg by mouth every 6 (six) hours as needed for mild pain.    ALBUTEROL (PROVENTIL) (2.5 MG/3ML) 0.083% NEBULIZER SOLUTION    Take 2.5 mg by nebulization every 8 (eight) hours as needed for wheezing or shortness of breath.    ASPIRIN 81 MG TABLET    Take 81 mg by mouth daily.   BISACODYL (DULCOLAX) 10 MG SUPPOSITORY    Place 10 mg rectally daily. If constipation is not relieved by by MOM give 10 mg Bisacodyl suppository rectally x 1 dose in 24 hours as needed   CARBAMIDE PEROXIDE (DEBROX) 6.5 % OTIC SOLUTION    Place 5 drops into both ears daily as needed (cerum impaction).    CARVEDILOL (COREG) 3.125 MG TABLET    Take 3.125 mg by mouth 2 (two) times daily with a meal.   CLOTRIMAZOLE (LOTRIMIN) 1 % CREAM    Apply 1 application topically 2 (two) times daily.   DESONIDE (DESOWEN) 0.05 % LOTION    Apply 1 application topically 2 (two) times daily. Monday - Thursday - to lower legs   FEEDING SUPPLEMENT, ENSURE, (ENSURE) PUDG    Take 1 Container by mouth 3 (three) times daily between meals.   FERROUS SULFATE 325 (65 FE) MG TABLET  Take 325 mg by mouth 2 (two) times daily with a meal.   FINASTERIDE (PROSCAR) 5 MG TABLET    Take 5 mg by mouth daily.   FLUOCINONIDE CREAM (LIDEX) 0.05 %    Apply 1 application topically 2 (two) times daily. Monday through Thursday to legs   FLUTICASONE (FLONASE) 50 MCG/ACT NASAL SPRAY    Place 1 spray into both nostrils daily.   LISINOPRIL (PRINIVIL,ZESTRIL) 5 MG TABLET    1/2 tablet by mouth daily   MIRTAZAPINE (REMERON) 15 MG TABLET    Take 15 mg by mouth at bedtime.   OMEPRAZOLE (PRILOSEC) 20 MG CAPSULE    Take 20 mg by mouth daily.   POLYETHYLENE GLYCOL (MIRALAX / GLYCOLAX) PACKET    Take 17 g  by mouth daily as needed for moderate constipation.    POLYVINYL ALCOHOL (LIQUIFILM TEARS) 1.4 % OPHTHALMIC SOLUTION    Place 2 drops into both eyes 3 (three) times daily. For dry eyes   PROMETHAZINE (PHENERGAN) 25 MG TABLET    Take 25 mg by mouth every 6 (six) hours as needed for nausea or vomiting.   SENNOSIDES-DOCUSATE SODIUM (SENOKOT-S) 8.6-50 MG TABLET    Take 2 tablets by mouth 2 (two) times daily.   TAMSULOSIN (FLOMAX) 0.4 MG CAPS CAPSULE    Take 0.4 mg by mouth daily after supper.    VITAMIN B-12 (CYANOCOBALAMIN) 1000 MCG TABLET    Take 1,000 mcg by mouth daily. For supplement  Modified Medications   No medications on file  Discontinued Medications   No medications on file     Physical Exam: Filed Vitals:   06/03/14 1339  BP: 122/76  Pulse: 77  Temp: 97.1 F (36.2 C)  Resp: 20  Weight: 174 lb (78.926 kg)    Physical Exam  Constitutional: He appears well-developed and well-nourished. No distress.  HENT:  Head: Normocephalic and atraumatic.  Mouth/Throat: Oropharynx is clear and moist. No oropharyngeal exudate.  Eyes: Conjunctivae and EOM are normal. Pupils are equal, round, and reactive to light.  Neck: Normal range of motion. Neck supple.  Cardiovascular: Normal rate, regular rhythm and normal heart sounds.   Pulmonary/Chest: Effort normal and breath sounds normal.  Abdominal: Soft. Bowel sounds are normal.  Musculoskeletal: He exhibits no edema or tenderness.  Neurological: He is alert.  STML  Skin: Skin is warm and dry. He is not diaphoretic.  Psychiatric: He has a normal mood and affect.    Labs reviewed: Basic Metabolic Panel:  Recent Labs  10/04/13  NA 139  K 4.7  BUN 27*  CREATININE 0.8   Liver Function Tests: No results for input(s): AST, ALT, ALKPHOS, BILITOT, PROT, ALBUMIN in the last 8760 hours. No results for input(s): LIPASE, AMYLASE in the last 8760 hours. No results for input(s): AMMONIA in the last 8760 hours. CBC:  Recent Labs   10/04/13  WBC 8.5  HGB 9.8*  HCT 31*  PLT 198   TSH: No results for input(s): TSH in the last 8760 hours. A1C: Lab Results  Component Value Date   HGBA1C 6.6* 08/28/2013   Lipid Panel: No results for input(s): CHOL, HDL, LDLCALC, TRIG, CHOLHDL, LDLDIRECT in the last 8760 hours.  CBC with Diff    Result: 02/02/2014 6:18 PM   ( Status: F )       WBC 6.6     4.0-10.5 K/uL SLN   RBC 4.94     4.22-5.81 MIL/uL SLN   Hemoglobin 10.1   L 13.0-17.0 g/dL SLN  Hematocrit 32.3   L 39.0-52.0 % SLN   MCV 65.4   L 78.0-100.0 fL SLN   MCH 20.4   L 26.0-34.0 pg SLN   MCHC 31.3     30.0-36.0 g/dL SLN   RDW 16.1   H 11.5-15.5 % SLN   Platelet Count 187     150-400 K/uL SLN   Granulocyte % 58     43-77 % SLN   Absolute Gran 3.8     1.7-7.7 K/uL SLN   Lymph % 26     12-46 % SLN   Absolute Lymph 1.7     0.7-4.0 K/uL SLN   Mono % 12     3-12 % SLN   Absolute Mono 0.8     0.1-1.0 K/uL SLN   Eos % 4     0-5 % SLN   Absolute Eos 0.3     0.0-0.7 K/uL SLN   Baso % 0     0-1 % SLN   Absolute Baso 0.0     0.0-0.1 K/uL SLN   Smear Review Criteria for review not met  SLN   Basic Metabolic Panel    Result: 02/02/2014 4:54 PM   ( Status: F )       Sodium 141     135-145 mEq/L SLN   Potassium 3.8     3.5-5.3 mEq/L SLN   Chloride 107     96-112 mEq/L SLN   CO2 25     19-32 mEq/L SLN   Glucose 138   H 70-99 mg/dL SLN   BUN 22     6-23 mg/dL SLN   Creatinine 0.86     0.50-1.35 mg/dL SLN   Calcium 8.9     8.4-10.5 mg/dL SLN  CBC with Diff    Result: 05/19/2014 7:01 PM   ( Status: F )     C WBC 7.6     4.0-10.5 K/uL SLN   RBC 4.97     4.22-5.81 MIL/uL SLN   Hemoglobin 10.3   L 13.0-17.0 g/dL SLN   Hematocrit 32.6   L 39.0-52.0 % SLN   MCV 65.6   L 78.0-100.0 fL SLN   MCH 20.7   L 26.0-34.0 pg SLN   MCHC 31.6     30.0-36.0 g/dL SLN   RDW 16.1   H 11.5-15.5 % SLN   Platelet Count 160     150-400 K/uL SLN   MPV 10.1     8.6-12.4 fL SLN   Granulocyte % 63     43-77 % SLN   Absolute Gran 4.8       1.7-7.7 K/uL SLN   Lymph % 23     12-46 % SLN   Absolute Lymph 1.7     0.7-4.0 K/uL SLN   Mono % 10     3-12 % SLN   Absolute Mono 0.8     0.1-1.0 K/uL SLN   Eos % 4     0-5 % SLN   Absolute Eos 0.3     0.0-0.7 K/uL SLN   Baso % 0     0-1 % SLN   Absolute Baso 0.0     0.0-0.1 K/uL SLN   Smear Review Criteria for review not met  SLN   Basic Metabolic Panel    Result: 05/19/2014 10:39 PM   ( Status: F )       Sodium 142     135-145 mEq/L SLN   Potassium 3.7  3.5-5.3 mEq/L SLN   Chloride 108     96-112 mEq/L SLN   CO2 26     19-32 mEq/L SLN   Glucose 137   H 70-99 mg/dL SLN   BUN 23     6-23 mg/dL SLN   Creatinine 0.86     0.50-1.35 mg/dL SLN   Calcium 8.9     8.4-10.5 mg/dL SLN   Hemoglobin A1c with eAG    Result: 05/19/2014 8:35 PM   ( Status: F )       Hemoglobin A1C 6.8   H <5.7 % SLN C Estimated Average Glucose 148   H <117 mg/dL SLN   Assessment/Plan  1. Essential hypertension Blood pressure remains well controlled  2. Chronic systolic CHF (congestive heart failure) Fluid status is stable, conts on betablocker only   3. Gastroesophageal reflux disease without esophagitis conts on prilosec daily  4. Dementia with behavioral disturbance Without acute changes in cognitive or functional status. Behaviors stable at this time   5. BPH (benign prostatic hyperplasia) -conts with chronic foley and finasteride   6. ANEMIA -conts ferrous sulfate, hgb stable  7. Diabetes type 2 Not currently requiring medications. A1c being monitored and currently 6.8 which is appropriate for pts age

## 2014-06-06 ENCOUNTER — Non-Acute Institutional Stay (SKILLED_NURSING_FACILITY): Payer: Medicare Other | Admitting: Nurse Practitioner

## 2014-06-06 DIAGNOSIS — J00 Acute nasopharyngitis [common cold]: Secondary | ICD-10-CM | POA: Diagnosis not present

## 2014-06-06 NOTE — Progress Notes (Signed)
Patient ID: Riley Martinez, male   DOB: [birth year], 81 y.o.   MRN: [id number]    Nursing Home Location:  [place] of Service: SNF (31)  PCP: No primary care provider on file.  Allergies  Allergen Reactions  . Ativan [Lorazepam] Other (See Comments)    unknown    Chief Complaint  Patient presents with  . Acute Visit    HPI:  Patient is a 81 y.o. male seen today at [facility], for acute visit, son reports pt has a cold and wants him seen per nursing. Pt with a PMH of dementia, HTN, BPH (s/p TURP) GERD, CHF. Pt has been in his usual state of health without any acute issues per nursing. Good appetite. Pt denies cough, congestion, runny nose, sore throat, itchy eyes. Reports he is perfectly fine and is annoyed over the visit. Pt sound congested when talking.   Review of Systems:  Review of Systems  Constitutional: Negative for activity change, appetite change, fatigue and unexpected weight change.  HENT: Negative for congestion, hearing loss and rhinorrhea.   Eyes: Negative.   Respiratory: Negative for cough and shortness of breath.   Cardiovascular: Negative for chest pain, palpitations and leg swelling.  Gastrointestinal: Negative for abdominal pain, diarrhea and constipation.  Genitourinary: Negative for dysuria and difficulty urinating.  Musculoskeletal: Negative for myalgias and arthralgias.  Skin: Negative for color change and wound.  Neurological: Negative for dizziness and weakness.  Psychiatric/Behavioral: Positive for confusion (poor memory). Negative for behavioral problems and agitation.    Past Medical History  Diagnosis Date  . GERD (gastroesophageal reflux disease)   . Hypertension   . BPH (benign prostatic hyperplasia)   . Diabetes mellitus without complication   . Constipation    Past Surgical History  Procedure Laterality Date  . Hip surgery    . Hernia repair     Social History:   reports that he has quit  smoking. He has never used smokeless tobacco. He reports that he does not drink alcohol or use illicit drugs.  Family History  Problem Relation Age of Onset  . Diabetes Mellitus II Mother     Medications: Patient's Medications  New Prescriptions   No medications on file  Previous Medications   ACETAMINOPHEN (TYLENOL) 500 MG TABLET    Take 1,000 mg by mouth every 6 (six) hours as needed for mild pain.    ALBUTEROL (PROVENTIL) (2.5 MG/3ML) 0.083% NEBULIZER SOLUTION    Take 2.5 mg by nebulization every 8 (eight) hours as needed for wheezing or shortness of breath.    ASPIRIN 81 MG TABLET    Take 81 mg by mouth daily.   BISACODYL (DULCOLAX) 10 MG SUPPOSITORY    Place 10 mg rectally daily. If constipation is not relieved by by MOM give 10 mg Bisacodyl suppository rectally x 1 dose in 24 hours as needed   CARBAMIDE PEROXIDE (DEBROX) 6.5 % OTIC SOLUTION    Place 5 drops into both ears daily as needed (cerum impaction).    CARVEDILOL (COREG) 3.125 MG TABLET    Take 3.125 mg by mouth 2 (two) times daily with a meal.   CLOTRIMAZOLE (LOTRIMIN) 1 % CREAM    Apply 1 application topically 2 (two) times daily.   DESONIDE (DESOWEN) 0.05 % LOTION    Apply 1 application topically 2 (two) times daily. Monday - Thursday - to lower legs   FEEDING SUPPLEMENT, ENSURE, (ENSURE) PUDG    Take 1 Container by  mouth 3 (three) times daily between meals.   FERROUS SULFATE 325 (65 FE) MG TABLET    Take 325 mg by mouth 2 (two) times daily with a meal.   FINASTERIDE (PROSCAR) 5 MG TABLET    Take 5 mg by mouth daily.   FLUOCINONIDE CREAM (LIDEX) 0.05 %    Apply 1 application topically 2 (two) times daily. Monday through Thursday to legs   FLUTICASONE (FLONASE) 50 MCG/ACT NASAL SPRAY    Place 1 spray into both nostrils daily.   LISINOPRIL (PRINIVIL,ZESTRIL) 5 MG TABLET    1/2 tablet by mouth daily   MIRTAZAPINE (REMERON) 15 MG TABLET    Take 15 mg by mouth at bedtime.   OMEPRAZOLE (PRILOSEC) 20 MG CAPSULE    Take 20 mg by  mouth daily.   POLYETHYLENE GLYCOL (MIRALAX / GLYCOLAX) PACKET    Take 17 g by mouth daily as needed for moderate constipation.    POLYVINYL ALCOHOL (LIQUIFILM TEARS) 1.4 % OPHTHALMIC SOLUTION    Place 2 drops into both eyes 3 (three) times daily. For dry eyes   PROMETHAZINE (PHENERGAN) 25 MG TABLET    Take 25 mg by mouth every 6 (six) hours as needed for nausea or vomiting.   SENNOSIDES-DOCUSATE SODIUM (SENOKOT-S) 8.6-50 MG TABLET    Take 2 tablets by mouth 2 (two) times daily.   TAMSULOSIN (FLOMAX) 0.4 MG CAPS CAPSULE    Take 0.4 mg by mouth daily after supper.    VITAMIN B-12 (CYANOCOBALAMIN) 1000 MCG TABLET    Take 1,000 mcg by mouth daily. For supplement  Modified Medications   No medications on file  Discontinued Medications   No medications on file     Physical Exam: Filed Vitals:   06/06/14 1511  BP: 122/76  Pulse: 77  Temp: 97.1 F (36.2 C)  Resp: 20    Physical Exam  Constitutional: He appears well-developed and well-nourished. No distress.  HENT:  Head: Normocephalic and atraumatic.  Mouth/Throat: Oropharynx is clear and moist. No oropharyngeal exudate.  Eyes: Conjunctivae and EOM are normal. Pupils are equal, round, and reactive to light.  Neck: Normal range of motion. Neck supple.  Cardiovascular: Normal rate, regular rhythm and normal heart sounds.   Pulmonary/Chest: Effort normal and breath sounds normal.  Abdominal: Soft. Bowel sounds are normal.  Musculoskeletal: He exhibits no edema or tenderness.  Neurological: He is alert.  STML  Skin: Skin is warm and dry. He is not diaphoretic.  Psychiatric: He has a normal mood and affect.    Labs reviewed: Basic Metabolic Panel:  Recent Labs  10/04/13  NA 139  K 4.7  BUN 27*  CREATININE 0.8   Liver Function Tests: No results for input(s): AST, ALT, ALKPHOS, BILITOT, PROT, ALBUMIN in the last 8760 hours. No results for input(s): LIPASE, AMYLASE in the last 8760 hours. No results for input(s): AMMONIA in the  last 8760 hours. CBC:  Recent Labs  10/04/13  WBC 8.5  HGB 9.8*  HCT 31*  PLT 198   TSH: No results for input(s): TSH in the last 8760 hours. A1C: Lab Results  Component Value Date   HGBA1C 6.6* 08/28/2013   Lipid Panel: No results for input(s): CHOL, HDL, LDLCALC, TRIG, CHOLHDL, LDLDIRECT in the last 8760 hours.  CBC with Diff    Result: 02/02/2014 6:18 PM   ( Status: F )       WBC 6.6     4.0-10.5 K/uL SLN   RBC 4.94     4.22-5.81  MIL/uL SLN   Hemoglobin 10.1   L 13.0-17.0 g/dL SLN   Hematocrit 32.3   L 39.0-52.0 % SLN   MCV 65.4   L 78.0-100.0 fL SLN   MCH 20.4   L 26.0-34.0 pg SLN   MCHC 31.3     30.0-36.0 g/dL SLN   RDW 16.1   H 11.5-15.5 % SLN   Platelet Count 187     150-400 K/uL SLN   Granulocyte % 58     43-77 % SLN   Absolute Gran 3.8     1.7-7.7 K/uL SLN   Lymph % 26     12-46 % SLN   Absolute Lymph 1.7     0.7-4.0 K/uL SLN   Mono % 12     3-12 % SLN   Absolute Mono 0.8     0.1-1.0 K/uL SLN   Eos % 4     0-5 % SLN   Absolute Eos 0.3     0.0-0.7 K/uL SLN   Baso % 0     0-1 % SLN   Absolute Baso 0.0     0.0-0.1 K/uL SLN   Smear Review Criteria for review not met  SLN   Basic Metabolic Panel    Result: 02/02/2014 4:54 PM   ( Status: F )       Sodium 141     135-145 mEq/L SLN   Potassium 3.8     3.5-5.3 mEq/L SLN   Chloride 107     96-112 mEq/L SLN   CO2 25     19-32 mEq/L SLN   Glucose 138   H 70-99 mg/dL SLN   BUN 22     6-23 mg/dL SLN   Creatinine 0.86     0.50-1.35 mg/dL SLN   Calcium 8.9     8.4-10.5 mg/dL SLN  CBC with Diff    Result: 05/19/2014 7:01 PM   ( Status: F )     C WBC 7.6     4.0-10.5 K/uL SLN   RBC 4.97     4.22-5.81 MIL/uL SLN   Hemoglobin 10.3   L 13.0-17.0 g/dL SLN   Hematocrit 32.6   L 39.0-52.0 % SLN   MCV 65.6   L 78.0-100.0 fL SLN   MCH 20.7   L 26.0-34.0 pg SLN   MCHC 31.6     30.0-36.0 g/dL SLN   RDW 16.1   H 11.5-15.5 % SLN   Platelet Count 160     150-400 K/uL SLN   MPV 10.1     8.6-12.4 fL SLN   Granulocyte % 63       43-77 % SLN   Absolute Gran 4.8     1.7-7.7 K/uL SLN   Lymph % 23     12-46 % SLN   Absolute Lymph 1.7     0.7-4.0 K/uL SLN   Mono % 10     3-12 % SLN   Absolute Mono 0.8     0.1-1.0 K/uL SLN   Eos % 4     0-5 % SLN   Absolute Eos 0.3     0.0-0.7 K/uL SLN   Baso % 0     0-1 % SLN   Absolute Baso 0.0     0.0-0.1 K/uL SLN   Smear Review Criteria for review not met  SLN   Basic Metabolic Panel    Result: 05/19/2014 10:39 PM   ( Status: F )       Sodium 142  135-145 mEq/L SLN   Potassium 3.7     3.5-5.3 mEq/L SLN   Chloride 108     96-112 mEq/L SLN   CO2 26     19-32 mEq/L SLN   Glucose 137   H 70-99 mg/dL SLN   BUN 23     6-23 mg/dL SLN   Creatinine 0.86     0.50-1.35 mg/dL SLN   Calcium 8.9     8.4-10.5 mg/dL SLN   Hemoglobin A1c with eAG    Result: 05/19/2014 8:35 PM   ( Status: F )       Hemoglobin A1C 6.8   H <5.7 % SLN C Estimated Average Glucose 148   H <117 mg/dL SLN   Assessment/Plan  1. Acute nasopharyngitis (common cold) -reports he is fine, pt denies all symptoms, will monitor -nursing to provide supportive care as needed

## 2014-06-16 ENCOUNTER — Non-Acute Institutional Stay (SKILLED_NURSING_FACILITY): Payer: Medicare Other | Admitting: Internal Medicine

## 2014-06-16 DIAGNOSIS — I5022 Chronic systolic (congestive) heart failure: Secondary | ICD-10-CM

## 2014-06-16 DIAGNOSIS — E119 Type 2 diabetes mellitus without complications: Secondary | ICD-10-CM

## 2014-06-16 DIAGNOSIS — I110 Hypertensive heart disease with heart failure: Secondary | ICD-10-CM

## 2014-06-16 DIAGNOSIS — K219 Gastro-esophageal reflux disease without esophagitis: Secondary | ICD-10-CM | POA: Diagnosis not present

## 2014-06-16 DIAGNOSIS — R1310 Dysphagia, unspecified: Secondary | ICD-10-CM

## 2014-06-16 DIAGNOSIS — I509 Heart failure, unspecified: Secondary | ICD-10-CM | POA: Diagnosis not present

## 2014-06-16 NOTE — Progress Notes (Signed)
MRN: [id number] Name: Riley Martinez  Sex: male Age: 81 y.o. DOB: Jan 18, 1915  [facility] Level Of Care: SNF Provider: Merrilee SeashoreALEXANDER, Phillip Maffei D Emergency Contacts: Extended Emergency Contact Information Primary Emergency Contact: Siebert,[name] Address: [address]          [place], [place] [address] [place] Home Phone: [phone] Relation: Son Secondary Emergency Contact: [facility],Mark Address: [address]           [place], [place] [id number] [place] Home Phone: [phone] Relation: Son  Code Status: DNR  Allergies: Ativan  Chief Complaint  Patient presents with  . Medical Management of Chronic Issues    HPI: Patient is 81 y.o. male who is being seen for routine issues.  Past Medical History  Diagnosis Date  . GERD (gastroesophageal reflux disease)   . Hypertension   . BPH (benign prostatic hyperplasia)   . Diabetes mellitus without complication   . Constipation     Past Surgical History  Procedure Laterality Date  . Hip surgery    . Hernia repair        Medication List       This list is accurate as of: 06/16/14 11:59 PM.  Always use your most recent med list.               acetaminophen 500 MG tablet  Commonly known as:  TYLENOL  Take 1,000 mg by mouth every 6 (six) hours as needed for mild pain.     albuterol (2.5 MG/3ML) 0.083% nebulizer solution  Commonly known as:  PROVENTIL  Take 2.5 mg by nebulization every 8 (eight) hours as needed for wheezing or shortness of breath.     aspirin 81 MG tablet  Take 81 mg by mouth daily.     bisacodyl 10 MG suppository  Commonly known as:  DULCOLAX  Place 10 mg rectally daily. If constipation is not relieved by by MOM give 10 mg Bisacodyl suppository rectally x 1 dose in 24 hours as needed     carbamide peroxide 6.5 % otic solution  Commonly known as:  DEBROX  Place 5 drops into both ears daily as needed (cerum impaction).     carvedilol 3.125 MG  tablet  Commonly known as:  COREG  Take 3.125 mg by mouth 2 (two) times daily with a meal.     clotrimazole 1 % cream  Commonly known as:  LOTRIMIN  Apply 1 application topically 2 (two) times daily.     desonide 0.05 % lotion  Commonly known as:  DESOWEN  Apply 1 application topically 2 (two) times daily. Monday - Thursday - to lower legs     feeding supplement (ENSURE) Pudg  Take 1 Container by mouth 3 (three) times daily between meals.     ferrous sulfate 325 (65 FE) MG tablet  Take 325 mg by mouth 2 (two) times daily with a meal.     finasteride 5 MG tablet  Commonly known as:  PROSCAR  Take 5 mg by mouth daily.     fluocinonide cream 0.05 %  Commonly known as:  LIDEX  Apply 1 application topically 2 (two) times daily. Monday through Thursday to legs     fluticasone 50 MCG/ACT nasal spray  Commonly known as:  FLONASE  Place 1 spray into both nostrils daily.     lisinopril 5 MG tablet  Commonly known as:  PRINIVIL,ZESTRIL  1/2 tablet by mouth daily     mirtazapine 15 MG tablet  Commonly known as:  REMERON  Take 15 mg by mouth at bedtime.     omeprazole 20 MG capsule  Commonly known as:  PRILOSEC  Take 20 mg by mouth daily.     polyethylene glycol packet  Commonly known as:  MIRALAX / GLYCOLAX  Take 17 g by mouth daily as needed for moderate constipation.     polyvinyl alcohol 1.4 % ophthalmic solution  Commonly known as:  LIQUIFILM TEARS  Place 2 drops into both eyes 3 (three) times daily. For dry eyes     promethazine 25 MG tablet  Commonly known as:  PHENERGAN  Take 25 mg by mouth every 6 (six) hours as needed for nausea or vomiting.     sennosides-docusate sodium 8.6-50 MG tablet  Commonly known as:  SENOKOT-S  Take 2 tablets by mouth 2 (two) times daily.     tamsulosin 0.4 MG Caps capsule  Commonly known as:  FLOMAX  Take 0.4 mg by mouth daily after supper.     vitamin B-12 1000 MCG tablet  Commonly known as:  CYANOCOBALAMIN  Take 1,000 mcg by  mouth daily. For supplement        No orders of the defined types were placed in this encounter.    Immunization History  Administered Date(s) Administered  . PPD Test 05/15/2013    History  Substance Use Topics  . Smoking status: Former Games developer  . Smokeless tobacco: Never Used  . Alcohol Use: No     Comment: occasionally has not had for years.    Review of Systems  DATA OBTAINED: from patient, nurse, medical record GENERAL:  no fevers, fatigue, appetite changes SKIN: No itching, rash HEENT: No complaint RESPIRATORY: No cough, wheezing, SOB CARDIAC: No chest pain, palpitations, lower extremity edema  GI: No abdominal pain, No N/V/D or constipation, No heartburn or reflux  GU: No dysuria, frequency or urgency, or incontinence  MUSCULOSKELETAL: No unrelieved bone/joint pain NEUROLOGIC: No new findings PSYCHIATRIC: No overt anxiety or sadness  Filed Vitals:   06/16/14 2102  BP: 136/68  Pulse: 68  Temp: 98 F (36.7 C)  Resp: 18    Physical Exam  GENERAL APPEARANCE: Alert, minconversant, No acute distress  SKIN: No diaphoresis rash HEENT: Unremarkable RESPIRATORY: Breathing is even, unlabored. Lung sounds are clear   CARDIOVASCULAR: Heart RRR no murmurs, rubs or gallops. No peripheral edema  GASTROINTESTINAL: Abdomen is soft, non-tender, not distended w/ normal bowel sounds.  GENITOURINARY: Bladder non tender, not distended  MUSCULOSKELETAL: No abnormal joints or musculature NEUROLOGIC: Cranial nerves 2-12 grossly intact. Moves all extremities PSYCHIATRIC: dementia, pt is almost always very ornery  Patient Active Problem List   Diagnosis Date Noted  . CKD (chronic kidney disease) stage 2, GFR 60-89 ml/min 03/11/2014  . Dementia with behavioral disturbance 11/18/2013  . FTT (failure to thrive) in adult 06/03/2013  . Chronic systolic CHF (congestive heart failure) 05/16/2013  . Hypernatremia 05/05/2013  . Anemia 05/05/2013  . Dysphagia 05/03/2013  . GERD  (gastroesophageal reflux disease)   . Hypertensive heart disease with congestive heart failure   . BPH (benign prostatic hyperplasia)   . Diabetes mellitus type 2, controlled, without complications   . Constipation     CBC    Component Value Date/Time   WBC 8.5 10/04/2013   WBC 8.9 05/14/2013 0350   RBC 4.53 05/14/2013 0350   HGB 9.8* 10/04/2013   HCT 31* 10/04/2013   PLT 198 10/04/2013   MCV 67.1* 05/14/2013 0350   LYMPHSABS 1.0  05/06/2013 0430   MONOABS 0.7 05/06/2013 0430   EOSABS 0.3 05/06/2013 0430   BASOSABS 0.0 05/06/2013 0430    CMP     Component Value Date/Time   NA 139 10/04/2013   NA 146 05/14/2013 0350   K 4.7 10/04/2013   CL 107 05/14/2013 0350   CO2 27 05/14/2013 0350   GLUCOSE 103* 05/14/2013 0350   BUN 27* 10/04/2013   BUN 11 05/14/2013 0350   CREATININE 0.8 10/04/2013   CREATININE 0.91 05/14/2013 0350   CALCIUM 8.4 05/14/2013 0350   PROT 6.2 05/06/2013 0430   ALBUMIN 2.3* 05/06/2013 0430   AST 11 05/06/2013 0430   ALT 14 05/06/2013 0430   ALKPHOS 32* 05/06/2013 0430   BILITOT 0.8 05/06/2013 0430   GFRNONAA 69* 05/14/2013 0350   GFRAA 80* 05/14/2013 0350    Assessment and Plan  Hypertensive heart disease with congestive heart failure Nicely controlled on low dose coreg and low dose lisinopril   Chronic systolic CHF (congestive heart failure) No recent problems or exacerbations;Plan- continue BBlocker and ACE without diuretic   Diabetes mellitus type 2, controlled, without complications Recent A1c 6.8 on no meds;Plan- continue Lisinopril 2.5 mg   GERD (gastroesophageal reflux disease) Chronic and stable on omeprazole 20 mg with no sign/sx of reflux;Plan- continue omeprazole   Dysphagia Pt without  PNA even with high risk for aspiration;Plan- continue thin liquids for comfort     Margit Hanks, MD

## 2014-07-03 LAB — BASIC METABOLIC PANEL
BUN: 28 mg/dL — AB (ref 4–21)
Creatinine: 0.8 mg/dL (ref 0.6–1.3)
Glucose: 83 mg/dL
POTASSIUM: 4.5 mmol/L (ref 3.4–5.3)
Sodium: 142 mmol/L (ref 137–147)

## 2014-07-03 LAB — HEMOGLOBIN A1C: HEMOGLOBIN A1C: 6.9 % — AB (ref 4.0–6.0)

## 2014-07-06 ENCOUNTER — Encounter: Payer: Self-pay | Admitting: Internal Medicine

## 2014-07-06 NOTE — Assessment & Plan Note (Signed)
Nicely controlled on low dose coreg and low dose lisinopril

## 2014-07-06 NOTE — Assessment & Plan Note (Signed)
Chronic and stable on omeprazole 20 mg with no sign/sx of reflux;Plan- continue omeprazole

## 2014-07-06 NOTE — Assessment & Plan Note (Signed)
Pt without  PNA even with high risk for aspiration;Plan- continue thin liquids for comfort

## 2014-07-06 NOTE — Assessment & Plan Note (Addendum)
Recent A1c 6.8 on no meds;Plan- continue Lisinopril 2.5 mg

## 2014-07-06 NOTE — Assessment & Plan Note (Signed)
No recent problems or exacerbations;Plan- continue BBlocker and ACE without diuretic

## 2014-07-29 ENCOUNTER — Non-Acute Institutional Stay (SKILLED_NURSING_FACILITY): Payer: Medicare Other | Admitting: Nurse Practitioner

## 2014-07-29 DIAGNOSIS — N182 Chronic kidney disease, stage 2 (mild): Secondary | ICD-10-CM

## 2014-07-29 DIAGNOSIS — F0391 Unspecified dementia with behavioral disturbance: Secondary | ICD-10-CM | POA: Diagnosis not present

## 2014-07-29 DIAGNOSIS — E119 Type 2 diabetes mellitus without complications: Secondary | ICD-10-CM | POA: Diagnosis not present

## 2014-07-29 DIAGNOSIS — D649 Anemia, unspecified: Secondary | ICD-10-CM

## 2014-07-29 DIAGNOSIS — K5900 Constipation, unspecified: Secondary | ICD-10-CM

## 2014-07-29 DIAGNOSIS — I5022 Chronic systolic (congestive) heart failure: Secondary | ICD-10-CM | POA: Diagnosis not present

## 2014-07-29 DIAGNOSIS — F03918 Unspecified dementia, unspecified severity, with other behavioral disturbance: Secondary | ICD-10-CM

## 2014-07-29 DIAGNOSIS — K219 Gastro-esophageal reflux disease without esophagitis: Secondary | ICD-10-CM | POA: Diagnosis not present

## 2014-07-29 DIAGNOSIS — N400 Enlarged prostate without lower urinary tract symptoms: Secondary | ICD-10-CM | POA: Diagnosis not present

## 2014-07-29 NOTE — Progress Notes (Signed)
Patient ID: Riley Martinez, male   DOB: 1915/03/17, 81 y.o.   MRN: [id number]    Nursing Home Location:  [place] of Service: SNF (31)  PCP: No primary care provider on file.  Allergies  Allergen Reactions  . Ativan [Lorazepam] Other (See Comments)    unknown    Chief Complaint  Patient presents with  . Medical Management of Chronic Issues    HPI:  Patient is a 81 y.o. male seen today at [facility], for routine follow up on chronic conditions. Pt with a PMH of dementia, HTN, BPH (s/p TURP) GERD, CHF. Pt has been in his usual state of health over the last month. No nursing concerns today. Pt is a poor historian with hx of dementia and therefore HPI and ROM limited, pt denies any problems.  Review of Systems:  Review of Systems  Constitutional: Negative for activity change, appetite change, fatigue and unexpected weight change.  HENT: Negative for congestion, hearing loss and rhinorrhea.   Eyes: Negative.   Respiratory: Negative for cough and shortness of breath.   Cardiovascular: Negative for chest pain, palpitations and leg swelling.  Gastrointestinal: Negative for abdominal pain, diarrhea and constipation.  Genitourinary: Negative for dysuria and difficulty urinating.  Musculoskeletal: Negative for myalgias and arthralgias.  Skin: Negative for color change and wound.  Neurological: Negative for dizziness and weakness.  Psychiatric/Behavioral: Positive for confusion (poor memory). Negative for behavioral problems and agitation.    Past Medical History  Diagnosis Date  . GERD (gastroesophageal reflux disease)   . Hypertension   . BPH (benign prostatic hyperplasia)   . Diabetes mellitus without complication   . Constipation    Past Surgical History  Procedure Laterality Date  . Hip surgery    . Hernia repair     Social History:   reports that he has quit smoking. He has never used smokeless tobacco. He reports that he does not  drink alcohol or use illicit drugs.  Family History  Problem Relation Age of Onset  . Diabetes Mellitus II Mother     Medications: Patient's Medications  New Prescriptions   No medications on file  Previous Medications   ACETAMINOPHEN (TYLENOL) 500 MG TABLET    Take 1,000 mg by mouth every 6 (six) hours as needed for mild pain.    ALBUTEROL (PROVENTIL) (2.5 MG/3ML) 0.083% NEBULIZER SOLUTION    Take 2.5 mg by nebulization every 8 (eight) hours as needed for wheezing or shortness of breath.    ASPIRIN 81 MG TABLET    Take 81 mg by mouth daily.   BISACODYL (DULCOLAX) 10 MG SUPPOSITORY    Place 10 mg rectally daily. If constipation is not relieved by by MOM give 10 mg Bisacodyl suppository rectally x 1 dose in 24 hours as needed   CARBAMIDE PEROXIDE (DEBROX) 6.5 % OTIC SOLUTION    Place 5 drops into both ears daily as needed (cerum impaction).    CARVEDILOL (COREG) 3.125 MG TABLET    Take 3.125 mg by mouth 2 (two) times daily with a meal.   CLOTRIMAZOLE (LOTRIMIN) 1 % CREAM    Apply 1 application topically 2 (two) times daily.   DESONIDE (DESOWEN) 0.05 % LOTION    Apply 1 application topically 2 (two) times daily. Monday - Thursday - to lower legs   FEEDING SUPPLEMENT, ENSURE, (ENSURE) PUDG    Take 1 Container by mouth 3 (three) times daily between meals.   FERROUS SULFATE 325 (65  FE) MG TABLET    Take 325 mg by mouth 2 (two) times daily with a meal.   FINASTERIDE (PROSCAR) 5 MG TABLET    Take 5 mg by mouth daily.   FLUOCINONIDE CREAM (LIDEX) 0.05 %    Apply 1 application topically 2 (two) times daily. Monday through Thursday to legs   FLUTICASONE (FLONASE) 50 MCG/ACT NASAL SPRAY    Place 1 spray into both nostrils daily.   LISINOPRIL (PRINIVIL,ZESTRIL) 5 MG TABLET    1/2 tablet by mouth daily   MIRTAZAPINE (REMERON) 15 MG TABLET    Take 15 mg by mouth at bedtime.   OMEPRAZOLE (PRILOSEC) 20 MG CAPSULE    Take 20 mg by mouth daily.   POLYETHYLENE GLYCOL (MIRALAX / GLYCOLAX) PACKET    Take 17 g  by mouth daily as needed for moderate constipation.    POLYVINYL ALCOHOL (LIQUIFILM TEARS) 1.4 % OPHTHALMIC SOLUTION    Place 2 drops into both eyes 3 (three) times daily. For dry eyes   PROMETHAZINE (PHENERGAN) 25 MG TABLET    Take 25 mg by mouth every 6 (six) hours as needed for nausea or vomiting.   SENNOSIDES-DOCUSATE SODIUM (SENOKOT-S) 8.6-50 MG TABLET    Take 2 tablets by mouth 2 (two) times daily.   TAMSULOSIN (FLOMAX) 0.4 MG CAPS CAPSULE    Take 0.4 mg by mouth daily after supper.    VITAMIN B-12 (CYANOCOBALAMIN) 1000 MCG TABLET    Take 1,000 mcg by mouth daily. For supplement  Modified Medications   No medications on file  Discontinued Medications   No medications on file     Physical Exam: Filed Vitals:   07/29/14 1749  BP: 132/74  Pulse: 72  Temp: 98.3 F (36.8 C)  Resp: 20  Weight: 172 lb (78.019 kg)    Physical Exam  Constitutional: He appears well-developed and well-nourished. No distress.  HENT:  Head: Normocephalic and atraumatic.  Mouth/Throat: Oropharynx is clear and moist. No oropharyngeal exudate.  Eyes: Conjunctivae and EOM are normal. Pupils are equal, round, and reactive to light.  Neck: Normal range of motion. Neck supple.  Cardiovascular: Normal rate, regular rhythm and normal heart sounds.   Pulmonary/Chest: Effort normal and breath sounds normal.  Abdominal: Soft. Bowel sounds are normal.  Musculoskeletal: He exhibits no edema or tenderness.  Neurological: He is alert.  STML  Skin: Skin is warm and dry. He is not diaphoretic.  Psychiatric: He has a normal mood and affect.    Labs reviewed: Basic Metabolic Panel:  Recent Labs  10/04/13  NA 139  K 4.7  BUN 27*  CREATININE 0.8   Liver Function Tests: No results for input(s): AST, ALT, ALKPHOS, BILITOT, PROT, ALBUMIN in the last 8760 hours. No results for input(s): LIPASE, AMYLASE in the last 8760 hours. No results for input(s): AMMONIA in the last 8760 hours. CBC:  Recent Labs   10/04/13  WBC 8.5  HGB 9.8*  HCT 31*  PLT 198   TSH: No results for input(s): TSH in the last 8760 hours. A1C: Lab Results  Component Value Date   HGBA1C 6.6* 08/28/2013   Lipid Panel: No results for input(s): CHOL, HDL, LDLCALC, TRIG, CHOLHDL, LDLDIRECT in the last 8760 hours.  CBC with Diff    Result: 02/02/2014 6:18 PM   ( Status: F )       WBC 6.6     4.0-10.5 K/uL SLN   RBC 4.94     4.22-5.81 MIL/uL SLN   Hemoglobin 10.1  L 13.0-17.0 g/dL SLN   Hematocrit 32.3   L 39.0-52.0 % SLN   MCV 65.4   L 78.0-100.0 fL SLN   MCH 20.4   L 26.0-34.0 pg SLN   MCHC 31.3     30.0-36.0 g/dL SLN   RDW 16.1   H 11.5-15.5 % SLN   Platelet Count 187     150-400 K/uL SLN   Granulocyte % 58     43-77 % SLN   Absolute Gran 3.8     1.7-7.7 K/uL SLN   Lymph % 26     12-46 % SLN   Absolute Lymph 1.7     0.7-4.0 K/uL SLN   Mono % 12     3-12 % SLN   Absolute Mono 0.8     0.1-1.0 K/uL SLN   Eos % 4     0-5 % SLN   Absolute Eos 0.3     0.0-0.7 K/uL SLN   Baso % 0     0-1 % SLN   Absolute Baso 0.0     0.0-0.1 K/uL SLN   Smear Review Criteria for review not met  SLN   Basic Metabolic Panel    Result: 02/02/2014 4:54 PM   ( Status: F )       Sodium 141     135-145 mEq/L SLN   Potassium 3.8     3.5-5.3 mEq/L SLN   Chloride 107     96-112 mEq/L SLN   CO2 25     19-32 mEq/L SLN   Glucose 138   H 70-99 mg/dL SLN   BUN 22     6-23 mg/dL SLN   Creatinine 0.86     0.50-1.35 mg/dL SLN   Calcium 8.9     8.4-10.5 mg/dL SLN  CBC with Diff    Result: 05/19/2014 7:01 PM   ( Status: F )     C WBC 7.6     4.0-10.5 K/uL SLN   RBC 4.97     4.22-5.81 MIL/uL SLN   Hemoglobin 10.3   L 13.0-17.0 g/dL SLN   Hematocrit 32.6   L 39.0-52.0 % SLN   MCV 65.6   L 78.0-100.0 fL SLN   MCH 20.7   L 26.0-34.0 pg SLN   MCHC 31.6     30.0-36.0 g/dL SLN   RDW 16.1   H 11.5-15.5 % SLN   Platelet Count 160     150-400 K/uL SLN   MPV 10.1     8.6-12.4 fL SLN   Granulocyte % 63     43-77 % SLN   Absolute Gran 4.8       1.7-7.7 K/uL SLN   Lymph % 23     12-46 % SLN   Absolute Lymph 1.7     0.7-4.0 K/uL SLN   Mono % 10     3-12 % SLN   Absolute Mono 0.8     0.1-1.0 K/uL SLN   Eos % 4     0-5 % SLN   Absolute Eos 0.3     0.0-0.7 K/uL SLN   Baso % 0     0-1 % SLN   Absolute Baso 0.0     0.0-0.1 K/uL SLN   Smear Review Criteria for review not met  SLN   Basic Metabolic Panel    Result: 05/19/2014 10:39 PM   ( Status: F )       Sodium 142     135-145 mEq/L SLN  Potassium 3.7     3.5-5.3 mEq/L SLN   Chloride 108     96-112 mEq/L SLN   CO2 26     19-32 mEq/L SLN   Glucose 137   H 70-99 mg/dL SLN   BUN 23     6-23 mg/dL SLN   Creatinine 0.86     0.50-1.35 mg/dL SLN   Calcium 8.9     8.4-10.5 mg/dL SLN   Hemoglobin A1c with eAG    Result: 05/19/2014 8:35 PM   ( Status: F )       Hemoglobin A1C 6.8   H <5.7 % SLN C Estimated Average Glucose 148   H <117 mg/dL SLN    CBC NO Diff (Complete Blood Count)    Result: 07/03/2014 8:16 AM   ( Status: F )       WBC 6.0     4.0-10.5 K/uL SLN   RBC 4.90     4.22-5.81 MIL/uL SLN   Hemoglobin 10.0   L 13.0-17.0 g/dL SLN   Hematocrit 32.4   L 39.0-52.0 % SLN   MCV 66.1   L 78.0-100.0 fL SLN   MCH 20.4   L 26.0-34.0 pg SLN   MCHC 30.9     30.0-36.0 g/dL SLN   RDW 17.3   H 11.5-15.5 % SLN   Platelet Count 145   L 150-400 K/uL SLN   MPV Not Performed     8.6-12.4 fL SLN   Basic Metabolic Panel    Result: 07/03/2014 1:14 AM   ( Status: F )       Sodium 142     135-145 mEq/L SLN   Potassium 4.5     3.5-5.3 mEq/L SLN   Chloride 108     96-112 mEq/L SLN   CO2 19     19-32 mEq/L SLN   Glucose 83     70-99 mg/dL SLN   BUN 28   H 6-23 mg/dL SLN   Creatinine 0.82     0.50-1.35 mg/dL SLN   Calcium 8.6     8.4-10.5 mg/dL SLN   Hemoglobin A1c with eAG    Result: 07/03/2014 1:29 AM   ( Status: F )       Hemoglobin A1C 6.9   H <5.7 % SLN C Estimated Average Glucose 151   H <117 mg/dL SLN   Assessment/Plan  1. Chronic systolic CHF (congestive heart failure) -stable, conts  off diuretics without signs of fluid overload. conts on beta blocker   2. Constipation, unspecified constipation type -controlled on miralax daily  3. Gastroesophageal reflux disease without esophagitis Without symptoms on prilosec   4. BPH (benign prostatic hyperplasia) Conts on flomax  5. Dementia with behavioral disturbance Dementia remains unchanged, pt without acute cognitive or functional change in the last month.   6. CKD (chronic kidney disease) stage 2, GFR 60-89 ml/min -BUN and CR stable on recent blood work  7. Anemia, unspecified anemia type -conts on iron, hgb stable.   8. Diabetes mellitus type 2, controlled, without complications Diet controlled, A1c in appropriate range for age and co-morbities

## 2014-08-11 ENCOUNTER — Encounter: Payer: Self-pay | Admitting: Internal Medicine

## 2014-08-11 ENCOUNTER — Non-Acute Institutional Stay (SKILLED_NURSING_FACILITY): Payer: Medicare Other | Admitting: Internal Medicine

## 2014-08-11 DIAGNOSIS — N3000 Acute cystitis without hematuria: Secondary | ICD-10-CM

## 2014-08-11 DIAGNOSIS — N390 Urinary tract infection, site not specified: Secondary | ICD-10-CM | POA: Insufficient documentation

## 2014-08-11 NOTE — Assessment & Plan Note (Signed)
For increased behavoirs pt had U/A done which grew out > 100,000 E Coli resistant to fluroquinolones but sensitive to cephalosporins; CrCl calculated to be 56. omniceff 100 mg q 12 for 7 days

## 2014-08-11 NOTE — Progress Notes (Signed)
MRN: [id number] Name: Riley Martinez  Sex: male Age: 81 y.o. DOB: 12-10-1914  [facility] Level Of Care: SNF Provider: Merrilee SeashoreALEXANDER, ANNE D Emergency [id number]: Extended Emergency Contact Information Primary Emergency Contact: Bragdon,[name] Address: [address]          [place], [place] [address] [place] Home Phone: [phone] Relation: Son Secondary Emergency Contact: [facility] Address: [address]           [place], [place] [id number] [place] Home Phone: [phone] Relation: Son  Code Status: DNR  Allergies: Ativan  Chief Complaint  Patient presents with  . Acute Visit    HPI: Patient is 81 y.o. male who is being seen for treatment of acute UTI.  Past Medical History  Diagnosis Date  . GERD (gastroesophageal reflux disease)   . Hypertension   . BPH (benign prostatic hyperplasia)   . Diabetes mellitus without complication   . Constipation     Past Surgical History  Procedure Laterality Date  . Hip surgery    . Hernia repair        Medication List       This list is accurate as of: 08/11/14  8:12 PM.  Always use your most recent med list.               acetaminophen 500 MG tablet  Commonly known as:  TYLENOL  Take 1,000 mg by mouth every 6 (six) hours as needed for mild pain.     albuterol (2.5 MG/3ML) 0.083% nebulizer solution  Commonly known as:  PROVENTIL  Take 2.5 mg by nebulization every 8 (eight) hours as needed for wheezing or shortness of breath.     aspirin 81 MG tablet  Take 81 mg by mouth daily.     bisacodyl 10 MG suppository  Commonly known as:  DULCOLAX  Place 10 mg rectally daily. If constipation is not relieved by by MOM give 10 mg Bisacodyl suppository rectally x 1 dose in 24 hours as needed     carbamide peroxide 6.5 % otic solution  Commonly known as:  DEBROX  Place 5 drops into both ears daily as needed (cerum impaction).     carvedilol 3.125 MG tablet  Commonly  known as:  COREG  Take 3.125 mg by mouth 2 (two) times daily with a meal.     clotrimazole 1 % cream  Commonly known as:  LOTRIMIN  Apply 1 application topically 2 (two) times daily.     desonide 0.05 % lotion  Commonly known as:  DESOWEN  Apply 1 application topically 2 (two) times daily. Monday - Thursday - to lower legs     feeding supplement (ENSURE) Pudg  Take 1 Container by mouth 3 (three) times daily between meals.     ferrous sulfate 325 (65 FE) MG tablet  Take 325 mg by mouth 2 (two) times daily with a meal.     finasteride 5 MG tablet  Commonly known as:  PROSCAR  Take 5 mg by mouth daily.     fluocinonide cream 0.05 %  Commonly known as:  LIDEX  Apply 1 application topically 2 (two) times daily. Monday through Thursday to legs     fluticasone 50 MCG/ACT nasal spray  Commonly known as:  FLONASE  Place 1 spray into both nostrils daily.     lisinopril 5 MG tablet  Commonly known as:  PRINIVIL,ZESTRIL  1/2 tablet by mouth daily     mirtazapine 15 MG tablet  Commonly known as:  REMERON  Take 15 mg by mouth at bedtime.     omeprazole 20 MG capsule  Commonly known as:  PRILOSEC  Take 20 mg by mouth daily.     polyethylene glycol packet  Commonly known as:  MIRALAX / GLYCOLAX  Take 17 g by mouth daily as needed for moderate constipation.     polyvinyl alcohol 1.4 % ophthalmic solution  Commonly known as:  LIQUIFILM TEARS  Place 2 drops into both eyes 3 (three) times daily. For dry eyes     promethazine 25 MG tablet  Commonly known as:  PHENERGAN  Take 25 mg by mouth every 6 (six) hours as needed for nausea or vomiting.     sennosides-docusate sodium 8.6-50 MG tablet  Commonly known as:  SENOKOT-S  Take 2 tablets by mouth 2 (two) times daily.     tamsulosin 0.4 MG Caps capsule  Commonly known as:  FLOMAX  Take 0.4 mg by mouth daily after supper.     vitamin B-12 1000 MCG tablet  Commonly known as:  CYANOCOBALAMIN  Take 1,000 mcg by mouth daily. For  supplement        No orders of the defined types were placed in this encounter.    Immunization History  Administered Date(s) Administered  . PPD Test 05/15/2013    History  Substance Use Topics  . Smoking status: Former Games developermoker  . Smokeless tobacco: Never Used  . Alcohol Use: No     Comment: occasionally has not had for years.    Review of Systems  Dementia  In pt; per nursing has been more difficult than usual    Filed Vitals:   08/11/14 1952  BP: 124/70  Pulse: 105  Temp: 96.9 F (36.1 C)  Resp: 20    Physical Exam  GENERAL APPEARANCE: Alert, conversant, No acute distress, very ornoery WM  SKIN: No diaphoresis rash HEENT: Unremarkable RESPIRATORY: Breathing is even, unlabored   CARDIOVASCULAR: . No peripheral edema  GASTROINTESTINAL: Abdomen is not distended  GENITOURINARY: Bladder  not distended  MUSCULOSKELETAL: No abnormal joints or musculature NEUROLOGIC: Cranial nerves 2-12 grossly intact PSYCHIATRIC: dementia;pt has good days and bad days  Patient Active Problem List   Diagnosis Date Noted  . UTI (urinary tract infection) 08/11/2014  . CKD (chronic kidney disease) stage 2, GFR 60-89 ml/min 03/11/2014  . Dementia with behavioral disturbance 11/18/2013  . FTT (failure to thrive) in adult 06/03/2013  . Chronic systolic CHF (congestive heart failure) 05/16/2013  . Hypernatremia 05/05/2013  . Anemia 05/05/2013  . Dysphagia 05/03/2013  . GERD (gastroesophageal reflux disease)   . Hypertensive heart disease with congestive heart failure   . BPH (benign prostatic hyperplasia)   . Diabetes mellitus type 2, controlled, without complications   . Constipation     CBC    Component Value Date/Time   WBC 8.5 10/04/2013   WBC 8.9 05/14/2013 0350   RBC 4.53 05/14/2013 0350   HGB 9.8* 10/04/2013   HCT 31* 10/04/2013   PLT 198 10/04/2013   MCV 67.1* 05/14/2013 0350   LYMPHSABS 1.0 05/06/2013 0430   MONOABS 0.7 05/06/2013 0430   EOSABS 0.3 05/06/2013  0430   BASOSABS 0.0 05/06/2013 0430    CMP     Component Value Date/Time   NA 139 10/04/2013   NA 146 05/14/2013 0350   K 4.7 10/04/2013   CL 107 05/14/2013 0350   CO2 27 05/14/2013 0350   GLUCOSE 103* 05/14/2013 0350   BUN  27* 10/04/2013   BUN 11 05/14/2013 0350   CREATININE 0.8 10/04/2013   CREATININE 0.91 05/14/2013 0350   CALCIUM 8.4 05/14/2013 0350   PROT 6.2 05/06/2013 0430   ALBUMIN 2.3* 05/06/2013 0430   AST 11 05/06/2013 0430   ALT 14 05/06/2013 0430   ALKPHOS 32* 05/06/2013 0430   BILITOT 0.8 05/06/2013 0430   GFRNONAA 69* 05/14/2013 0350   GFRAA 80* 05/14/2013 0350    Assessment and Plan  UTI (urinary tract infection) For increased behavoirs pt had U/A done which grew out > 100,000 E Coli resistant to fluroquinolones but sensitive to cephalosporins; CrCl calculated to be 56. omniceff 100 mg q 12 for 7 days     Margit Hanks, MD

## 2014-08-15 ENCOUNTER — Non-Acute Institutional Stay (SKILLED_NURSING_FACILITY): Payer: Medicare Other | Admitting: Nurse Practitioner

## 2014-08-15 DIAGNOSIS — N3000 Acute cystitis without hematuria: Secondary | ICD-10-CM

## 2014-08-15 DIAGNOSIS — R1310 Dysphagia, unspecified: Secondary | ICD-10-CM | POA: Diagnosis not present

## 2014-08-15 NOTE — Progress Notes (Signed)
Patient ID: Riley Martinez, male   DOB: 22-Jan-1915, 81 y.o.   MRN: [id number]    Nursing Home Location:  [place] of Service: SNF (31)  PCP: No primary care provider on file.  Allergies  Allergen Reactions  . Ativan [Lorazepam] Other (See Comments)    unknown    Chief Complaint  Patient presents with  . Acute Visit    HPI:  Patient is a 81 y.o. male seen today at [facility], per nursing for family concerns. Pt with a PMH of dementia, HTN, BPH (s/p TURP) GERD, CHF. Pts son is worried about pts frequency he is having with his UTI. Staff reports this has gotten much better. Pt has been on omnicef for 4 days for UTI. No fevers or chills. Pt reports he feels fine. Son worried about urosepsis due to hx.    Review of Systems:  Review of Systems  Constitutional: Negative for activity change, appetite change, fatigue and unexpected weight change.  HENT: Negative for congestion, hearing loss and rhinorrhea.   Eyes: Negative.   Respiratory: Negative for cough, shortness of breath and wheezing.   Cardiovascular: Negative for chest pain, palpitations and leg swelling.  Gastrointestinal: Negative for abdominal pain, diarrhea and constipation.  Genitourinary: Positive for frequency (reports frequency, has not noticed if it is improving). Negative for dysuria, hematuria and difficulty urinating.  Musculoskeletal: Negative for myalgias and arthralgias.  Skin: Negative for color change and wound.  Neurological: Negative for dizziness and weakness.  Psychiatric/Behavioral: Positive for confusion (poor memory). Negative for behavioral problems and agitation.    Past Medical History  Diagnosis Date  . GERD (gastroesophageal reflux disease)   . Hypertension   . BPH (benign prostatic hyperplasia)   . Diabetes mellitus without complication   . Constipation    Past Surgical History  Procedure Laterality Date  . Hip surgery    . Hernia repair      Social History:   reports that he has quit smoking. He has never used smokeless tobacco. He reports that he does not drink alcohol or use illicit drugs.  Family History  Problem Relation Age of Onset  . Diabetes Mellitus II Mother     Medications: Patient's Medications  New Prescriptions   No medications on file  Previous Medications   ACETAMINOPHEN (TYLENOL) 500 MG TABLET    Take 1,000 mg by mouth every 6 (six) hours as needed for mild pain.    ALBUTEROL (PROVENTIL) (2.5 MG/3ML) 0.083% NEBULIZER SOLUTION    Take 2.5 mg by nebulization every 8 (eight) hours as needed for wheezing or shortness of breath.    ASPIRIN 81 MG TABLET    Take 81 mg by mouth daily.   BISACODYL (DULCOLAX) 10 MG SUPPOSITORY    Place 10 mg rectally daily. If constipation is not relieved by by MOM give 10 mg Bisacodyl suppository rectally x 1 dose in 24 hours as needed   CARBAMIDE PEROXIDE (DEBROX) 6.5 % OTIC SOLUTION    Place 5 drops into both ears daily as needed (cerum impaction).    CARVEDILOL (COREG) 3.125 MG TABLET    Take 3.125 mg by mouth 2 (two) times daily with a meal.   CLOTRIMAZOLE (LOTRIMIN) 1 % CREAM    Apply 1 application topically 2 (two) times daily.   DESONIDE (DESOWEN) 0.05 % LOTION    Apply 1 application topically 2 (two) times daily. Monday - Thursday - to lower legs   FEEDING SUPPLEMENT, ENSURE, (ENSURE)  PUDG    Take 1 Container by mouth 3 (three) times daily between meals.   FERROUS SULFATE 325 (65 FE) MG TABLET    Take 325 mg by mouth 2 (two) times daily with a meal.   FINASTERIDE (PROSCAR) 5 MG TABLET    Take 5 mg by mouth daily.   FLUOCINONIDE CREAM (LIDEX) 0.05 %    Apply 1 application topically 2 (two) times daily. Monday through Thursday to legs   FLUTICASONE (FLONASE) 50 MCG/ACT NASAL SPRAY    Place 1 spray into both nostrils daily.   LISINOPRIL (PRINIVIL,ZESTRIL) 5 MG TABLET    1/2 tablet by mouth daily   MIRTAZAPINE (REMERON) 15 MG TABLET    Take 15 mg by mouth at bedtime.    OMEPRAZOLE (PRILOSEC) 20 MG CAPSULE    Take 20 mg by mouth daily.   POLYETHYLENE GLYCOL (MIRALAX / GLYCOLAX) PACKET    Take 17 g by mouth daily as needed for moderate constipation.    POLYVINYL ALCOHOL (LIQUIFILM TEARS) 1.4 % OPHTHALMIC SOLUTION    Place 2 drops into both eyes 3 (three) times daily. For dry eyes   PROMETHAZINE (PHENERGAN) 25 MG TABLET    Take 25 mg by mouth every 6 (six) hours as needed for nausea or vomiting.   SENNOSIDES-DOCUSATE SODIUM (SENOKOT-S) 8.6-50 MG TABLET    Take 2 tablets by mouth 2 (two) times daily.   TAMSULOSIN (FLOMAX) 0.4 MG CAPS CAPSULE    Take 0.4 mg by mouth daily after supper.    VITAMIN B-12 (CYANOCOBALAMIN) 1000 MCG TABLET    Take 1,000 mcg by mouth daily. For supplement  Modified Medications   No medications on file  Discontinued Medications   No medications on file     Physical Exam: Filed Vitals:   08/15/14 1752  BP: 146/72  Pulse: 68  Temp: 97.1 F (36.2 C)  Resp: 20    Physical Exam  Constitutional: He appears well-developed and well-nourished. No distress.  HENT:  Head: Normocephalic and atraumatic.  Mouth/Throat: Oropharynx is clear and moist. No oropharyngeal exudate.  Eyes: Conjunctivae and EOM are normal. Pupils are equal, round, and reactive to light.  Neck: Normal range of motion. Neck supple.  Cardiovascular: Normal rate, regular rhythm and normal heart sounds.   Pulmonary/Chest: Effort normal. He has rhonchi in the right lower field and the left upper field.  Abdominal: Soft. Bowel sounds are normal.  Musculoskeletal: He exhibits no edema or tenderness.  Neurological: He is alert.  STML  Skin: Skin is warm and dry. He is not diaphoretic.  Psychiatric: He has a normal mood and affect.    Labs reviewed: Basic Metabolic Panel:  Recent Labs  10/04/13  NA 139  K 4.7  BUN 27*  CREATININE 0.8   Liver Function Tests: No results for input(s): AST, ALT, ALKPHOS, BILITOT, PROT, ALBUMIN in the last 8760 hours. No results  for input(s): LIPASE, AMYLASE in the last 8760 hours. No results for input(s): AMMONIA in the last 8760 hours. CBC:  Recent Labs  10/04/13  WBC 8.5  HGB 9.8*  HCT 31*  PLT 198   TSH: No results for input(s): TSH in the last 8760 hours. A1C: Lab Results  Component Value Date   HGBA1C 6.6* 08/28/2013   Lipid Panel: No results for input(s): CHOL, HDL, LDLCALC, TRIG, CHOLHDL, LDLDIRECT in the last 8760 hours.  CBC with Diff    Result: 02/02/2014 6:18 PM   ( Status: F )       WBC  6.6     4.0-10.5 K/uL SLN   RBC 4.94     4.22-5.81 MIL/uL SLN   Hemoglobin 10.1   L 13.0-17.0 g/dL SLN   Hematocrit 32.3   L 39.0-52.0 % SLN   MCV 65.4   L 78.0-100.0 fL SLN   MCH 20.4   L 26.0-34.0 pg SLN   MCHC 31.3     30.0-36.0 g/dL SLN   RDW 16.1   H 11.5-15.5 % SLN   Platelet Count 187     150-400 K/uL SLN   Granulocyte % 58     43-77 % SLN   Absolute Gran 3.8     1.7-7.7 K/uL SLN   Lymph % 26     12-46 % SLN   Absolute Lymph 1.7     0.7-4.0 K/uL SLN   Mono % 12     3-12 % SLN   Absolute Mono 0.8     0.1-1.0 K/uL SLN   Eos % 4     0-5 % SLN   Absolute Eos 0.3     0.0-0.7 K/uL SLN   Baso % 0     0-1 % SLN   Absolute Baso 0.0     0.0-0.1 K/uL SLN   Smear Review Criteria for review not met  SLN   Basic Metabolic Panel    Result: 02/02/2014 4:54 PM   ( Status: F )       Sodium 141     135-145 mEq/L SLN   Potassium 3.8     3.5-5.3 mEq/L SLN   Chloride 107     96-112 mEq/L SLN   CO2 25     19-32 mEq/L SLN   Glucose 138   H 70-99 mg/dL SLN   BUN 22     6-23 mg/dL SLN   Creatinine 0.86     0.50-1.35 mg/dL SLN   Calcium 8.9     8.4-10.5 mg/dL SLN  CBC with Diff    Result: 05/19/2014 7:01 PM   ( Status: F )     C WBC 7.6     4.0-10.5 K/uL SLN   RBC 4.97     4.22-5.81 MIL/uL SLN   Hemoglobin 10.3   L 13.0-17.0 g/dL SLN   Hematocrit 32.6   L 39.0-52.0 % SLN   MCV 65.6   L 78.0-100.0 fL SLN   MCH 20.7   L 26.0-34.0 pg SLN   MCHC 31.6     30.0-36.0 g/dL SLN   RDW 16.1   H 11.5-15.5 % SLN    Platelet Count 160     150-400 K/uL SLN   MPV 10.1     8.6-12.4 fL SLN   Granulocyte % 63     43-77 % SLN   Absolute Gran 4.8     1.7-7.7 K/uL SLN   Lymph % 23     12-46 % SLN   Absolute Lymph 1.7     0.7-4.0 K/uL SLN   Mono % 10     3-12 % SLN   Absolute Mono 0.8     0.1-1.0 K/uL SLN   Eos % 4     0-5 % SLN   Absolute Eos 0.3     0.0-0.7 K/uL SLN   Baso % 0     0-1 % SLN   Absolute Baso 0.0     0.0-0.1 K/uL SLN   Smear Review Criteria for review not met  SLN   Basic Metabolic Panel    Result: 05/19/2014  10:39 PM   ( Status: F )       Sodium 142     135-145 mEq/L SLN   Potassium 3.7     3.5-5.3 mEq/L SLN   Chloride 108     96-112 mEq/L SLN   CO2 26     19-32 mEq/L SLN   Glucose 137   H 70-99 mg/dL SLN   BUN 23     6-23 mg/dL SLN   Creatinine 0.86     0.50-1.35 mg/dL SLN   Calcium 8.9     8.4-10.5 mg/dL SLN   Hemoglobin A1c with eAG    Result: 05/19/2014 8:35 PM   ( Status: F )       Hemoglobin A1C 6.8   H <5.7 % SLN C Estimated Average Glucose 148   H <117 mg/dL SLN    CBC NO Diff (Complete Blood Count)    Result: 07/03/2014 8:16 AM   ( Status: F )       WBC 6.0     4.0-10.5 K/uL SLN   RBC 4.90     4.22-5.81 MIL/uL SLN   Hemoglobin 10.0   L 13.0-17.0 g/dL SLN   Hematocrit 32.4   L 39.0-52.0 % SLN   MCV 66.1   L 78.0-100.0 fL SLN   MCH 20.4   L 26.0-34.0 pg SLN   MCHC 30.9     30.0-36.0 g/dL SLN   RDW 17.3   H 11.5-15.5 % SLN   Platelet Count 145   L 150-400 K/uL SLN   MPV Not Performed     8.6-12.4 fL SLN   Basic Metabolic Panel    Result: 07/03/2014 1:14 AM   ( Status: F )       Sodium 142     135-145 mEq/L SLN   Potassium 4.5     3.5-5.3 mEq/L SLN   Chloride 108     96-112 mEq/L SLN   CO2 19     19-32 mEq/L SLN   Glucose 83     70-99 mg/dL SLN   BUN 28   H 6-23 mg/dL SLN   Creatinine 0.82     0.50-1.35 mg/dL SLN   Calcium 8.6     8.4-10.5 mg/dL SLN   Hemoglobin A1c with eAG    Result: 07/03/2014 1:29 AM   ( Status: F )       Hemoglobin A1C 6.9    H <5.7 % SLN C Estimated Average Glucose 151   H <117 mg/dL SLN   Assessment/Plan  1. Acute cystitis without hematuria -pt appears to be responding appropriately to antibiotic, no signs of urosepsis (VS stable, no tachycardia, fever, pt feels and appers at baseline)  however due to the fact he does has course lung sounds and is at risk for aspiration will change antibiotic to augmentin (which bacteria is sensitive to) 875-125 mg PO BID and DC omnicef due to better coverage  -florastor PO BID for 14 days  -will get CBC   2. Dysphagia  denies any shortness of breath, cough or congestion, however due to risk of aspiration and coarse lung sounds, improving some with cough, will change to Augmentin

## 2014-08-17 LAB — CBC AND DIFFERENTIAL
HCT: 29 % — AB (ref 41–53)
HEMOGLOBIN: 9.4 g/dL — AB (ref 13.5–17.5)
Platelets: 172 10*3/uL (ref 150–399)
WBC: 4.2 10^3/mL

## 2014-08-18 LAB — HM DIABETES FOOT EXAM

## 2014-08-29 ENCOUNTER — Non-Acute Institutional Stay (SKILLED_NURSING_FACILITY): Payer: Medicare Other | Admitting: Nurse Practitioner

## 2014-08-29 DIAGNOSIS — N400 Enlarged prostate without lower urinary tract symptoms: Secondary | ICD-10-CM

## 2014-08-29 DIAGNOSIS — E119 Type 2 diabetes mellitus without complications: Secondary | ICD-10-CM | POA: Diagnosis not present

## 2014-08-29 DIAGNOSIS — I5022 Chronic systolic (congestive) heart failure: Secondary | ICD-10-CM | POA: Diagnosis not present

## 2014-08-29 DIAGNOSIS — K219 Gastro-esophageal reflux disease without esophagitis: Secondary | ICD-10-CM | POA: Diagnosis not present

## 2014-08-29 DIAGNOSIS — I509 Heart failure, unspecified: Secondary | ICD-10-CM

## 2014-08-29 DIAGNOSIS — N3000 Acute cystitis without hematuria: Secondary | ICD-10-CM

## 2014-08-29 DIAGNOSIS — K5900 Constipation, unspecified: Secondary | ICD-10-CM

## 2014-08-29 DIAGNOSIS — I110 Hypertensive heart disease with heart failure: Secondary | ICD-10-CM | POA: Diagnosis not present

## 2014-08-29 NOTE — Progress Notes (Signed)
Patient ID: Riley Martinez, male   DOB: May 18, 1914, 81 y.o.   MRN: [id number]    Nursing Home Location:  [facility]: [facility] ([facility])  PCP: No primary care provider on file.  Allergies  Allergen Reactions  . Ativan [Lorazepam] Other (See Comments)    unknown    Chief Complaint  Patient presents with  . Medical Management of Chronic Issues    HPI:  Patient is a 81 y.o. male seen today at [facility], for routine follow up on chronic conditions. Pt with a PMH of dementia, HTN, BPH (s/p TURP) GERD, CHF. Pt was treated for UTI in the last month, has completed antibiotics without any ongoing symptoms at this time. Pt reports he is doing fine. Does not have any complaints. Staff without concerns.  Review of Systems:  Review of Systems  Constitutional: Negative for activity change, appetite change, fatigue and unexpected weight change.  HENT: Negative for congestion, hearing loss and rhinorrhea.   Eyes: Negative.   Respiratory: Negative for cough, shortness of breath and wheezing.   Cardiovascular: Negative for chest pain, palpitations and leg swelling.  Gastrointestinal: Negative for abdominal pain, diarrhea and constipation.  Genitourinary: Negative for dysuria, frequency, hematuria and difficulty urinating.  Musculoskeletal: Negative for myalgias and arthralgias.  Skin: Negative for color change and wound.  Neurological: Negative for dizziness and weakness.  Psychiatric/Behavioral: Positive for confusion (poor memory). Negative for behavioral problems and agitation.    Past Medical History  Diagnosis Date  . GERD (gastroesophageal reflux disease)   . Hypertension   . BPH (benign prostatic hyperplasia)   . Diabetes mellitus without complication   . Constipation    Past Surgical History  Procedure Laterality Date  . Hip surgery    . Hernia repair     Social History:   reports that he has quit smoking. He has never used smokeless  tobacco. He reports that he does not drink alcohol or use illicit drugs.  Family History  Problem Relation Age of Onset  . Diabetes Mellitus II Mother     Medications: Patient's Medications  New Prescriptions   No medications on file  Previous Medications   ACETAMINOPHEN (TYLENOL) 500 MG TABLET    Take 1,000 mg by mouth every 6 (six) hours as needed for mild pain.    ALBUTEROL (PROVENTIL) (2.5 MG/3ML) 0.083% NEBULIZER SOLUTION    Take 2.5 mg by nebulization every 8 (eight) hours as needed for wheezing or shortness of breath.    ASPIRIN 81 MG TABLET    Take 81 mg by mouth daily.   BISACODYL (DULCOLAX) 10 MG SUPPOSITORY    Place 10 mg rectally daily. If constipation is not relieved by by MOM give 10 mg Bisacodyl suppository rectally x 1 dose in 24 hours as needed   CARBAMIDE PEROXIDE (DEBROX) 6.5 % OTIC SOLUTION    Place 5 drops into both ears daily as needed (cerum impaction).    CARVEDILOL (COREG) 3.125 MG TABLET    Take 3.125 mg by mouth 2 (two) times daily with a meal.   CLOTRIMAZOLE (LOTRIMIN) 1 % CREAM    Apply 1 application topically 2 (two) times daily.   DESONIDE (DESOWEN) 0.05 % LOTION    Apply 1 application topically 2 (two) times daily. Monday - Thursday - to lower legs   FEEDING SUPPLEMENT, ENSURE, (ENSURE) PUDG    Take 1 Container by mouth 3 (three) times daily between meals.   FERROUS SULFATE 325 (65  FE) MG TABLET    Take 325 mg by mouth 2 (two) times daily with a meal.   FINASTERIDE (PROSCAR) 5 MG TABLET    Take 5 mg by mouth daily.   FLUOCINONIDE CREAM (LIDEX) 0.05 %    Apply 1 application topically 2 (two) times daily. Monday through Thursday to legs   FLUTICASONE (FLONASE) 50 MCG/ACT NASAL SPRAY    Place 1 spray into both nostrils daily.   LISINOPRIL (PRINIVIL,ZESTRIL) 5 MG TABLET    1/2 tablet by mouth daily   MIRTAZAPINE (REMERON) 15 MG TABLET    Take 15 mg by mouth at bedtime.   OMEPRAZOLE (PRILOSEC) 20 MG CAPSULE    Take 20 mg by mouth daily.   POLYETHYLENE GLYCOL  (MIRALAX / GLYCOLAX) PACKET    Take 17 g by mouth daily as needed for moderate constipation.    POLYVINYL ALCOHOL (LIQUIFILM TEARS) 1.4 % OPHTHALMIC SOLUTION    Place 2 drops into both eyes 3 (three) times daily. For dry eyes   PROMETHAZINE (PHENERGAN) 25 MG TABLET    Take 25 mg by mouth every 6 (six) hours as needed for nausea or vomiting.   SENNOSIDES-DOCUSATE SODIUM (SENOKOT-S) 8.6-50 MG TABLET    Take 2 tablets by mouth 2 (two) times daily.   TAMSULOSIN (FLOMAX) 0.4 MG CAPS CAPSULE    Take 0.4 mg by mouth daily after supper.    VITAMIN B-12 (CYANOCOBALAMIN) 1000 MCG TABLET    Take 1,000 mcg by mouth daily. For supplement  Modified Medications   No medications on file  Discontinued Medications   No medications on file     Physical Exam: Filed Vitals:   08/29/14 1935  BP: 118/64  Pulse: 68  Temp: 98.4 F (36.9 C)  Resp: 20  Weight: 174 lb (78.926 kg)    Physical Exam  Constitutional: He appears well-developed and well-nourished. No distress.  HENT:  Head: Normocephalic and atraumatic.  Mouth/Throat: Oropharynx is clear and moist. No oropharyngeal exudate.  Eyes: Conjunctivae and EOM are normal. Pupils are equal, round, and reactive to light.  Neck: Normal range of motion. Neck supple.  Cardiovascular: Normal rate, regular rhythm and normal heart sounds.   Pulmonary/Chest: Effort normal.  Abdominal: Soft. Bowel sounds are normal.  Musculoskeletal: He exhibits no edema or tenderness.  Neurological: He is alert.  STML  Skin: Skin is warm and dry. He is not diaphoretic.  Psychiatric: He has a normal mood and affect.    Labs reviewed: Basic Metabolic Panel:  Recent Labs  10/04/13  NA 139  K 4.7  BUN 27*  CREATININE 0.8   Liver Function Tests: No results for input(s): AST, ALT, ALKPHOS, BILITOT, PROT, ALBUMIN in the last 8760 hours. No results for input(s): LIPASE, AMYLASE in the last 8760 hours. No results for input(s): AMMONIA in the last 8760  hours. CBC:  Recent Labs  10/04/13  WBC 8.5  HGB 9.8*  HCT 31*  PLT 198   TSH: No results for input(s): TSH in the last 8760 hours. A1C: Lab Results  Component Value Date   HGBA1C 6.6* 08/28/2013   Lipid Panel: No results for input(s): CHOL, HDL, LDLCALC, TRIG, CHOLHDL, LDLDIRECT in the last 8760 hours.  CBC with Diff    Result: 02/02/2014 6:18 PM   ( Status: F )       WBC 6.6     4.0-10.5 K/uL SLN   RBC 4.94     4.22-5.81 MIL/uL SLN   Hemoglobin 10.1   L 13.0-17.0 g/dL  SLN   Hematocrit 32.3   L 39.0-52.0 % SLN   MCV 65.4   L 78.0-100.0 fL SLN   MCH 20.4   L 26.0-34.0 pg SLN   MCHC 31.3     30.0-36.0 g/dL SLN   RDW 16.1   H 11.5-15.5 % SLN   Platelet Count 187     150-400 K/uL SLN   Granulocyte % 58     43-77 % SLN   Absolute Gran 3.8     1.7-7.7 K/uL SLN   Lymph % 26     12-46 % SLN   Absolute Lymph 1.7     0.7-4.0 K/uL SLN   Mono % 12     3-12 % SLN   Absolute Mono 0.8     0.1-1.0 K/uL SLN   Eos % 4     0-5 % SLN   Absolute Eos 0.3     0.0-0.7 K/uL SLN   Baso % 0     0-1 % SLN   Absolute Baso 0.0     0.0-0.1 K/uL SLN   Smear Review Criteria for review not met  SLN   Basic Metabolic Panel    Result: 02/02/2014 4:54 PM   ( Status: F )       Sodium 141     135-145 mEq/L SLN   Potassium 3.8     3.5-5.3 mEq/L SLN   Chloride 107     96-112 mEq/L SLN   CO2 25     19-32 mEq/L SLN   Glucose 138   H 70-99 mg/dL SLN   BUN 22     6-23 mg/dL SLN   Creatinine 0.86     0.50-1.35 mg/dL SLN   Calcium 8.9     8.4-10.5 mg/dL SLN  CBC with Diff    Result: 05/19/2014 7:01 PM   ( Status: F )     C WBC 7.6     4.0-10.5 K/uL SLN   RBC 4.97     4.22-5.81 MIL/uL SLN   Hemoglobin 10.3   L 13.0-17.0 g/dL SLN   Hematocrit 32.6   L 39.0-52.0 % SLN   MCV 65.6   L 78.0-100.0 fL SLN   MCH 20.7   L 26.0-34.0 pg SLN   MCHC 31.6     30.0-36.0 g/dL SLN   RDW 16.1   H 11.5-15.5 % SLN   Platelet Count 160     150-400 K/uL SLN   MPV 10.1     8.6-12.4 fL SLN   Granulocyte % 63     43-77 % SLN    Absolute Gran 4.8     1.7-7.7 K/uL SLN   Lymph % 23     12-46 % SLN   Absolute Lymph 1.7     0.7-4.0 K/uL SLN   Mono % 10     3-12 % SLN   Absolute Mono 0.8     0.1-1.0 K/uL SLN   Eos % 4     0-5 % SLN   Absolute Eos 0.3     0.0-0.7 K/uL SLN   Baso % 0     0-1 % SLN   Absolute Baso 0.0     0.0-0.1 K/uL SLN   Smear Review Criteria for review not met  SLN   Basic Metabolic Panel    Result: 05/19/2014 10:39 PM   ( Status: F )       Sodium 142     135-145 mEq/L SLN   Potassium 3.7  3.5-5.3 mEq/L SLN   Chloride 108     96-112 mEq/L SLN   CO2 26     19-32 mEq/L SLN   Glucose 137   H 70-99 mg/dL SLN   BUN 23     6-23 mg/dL SLN   Creatinine 0.86     0.50-1.35 mg/dL SLN   Calcium 8.9     8.4-10.5 mg/dL SLN   Hemoglobin A1c with eAG    Result: 05/19/2014 8:35 PM   ( Status: F )       Hemoglobin A1C 6.8   H <5.7 % SLN C Estimated Average Glucose 148   H <117 mg/dL SLN    CBC NO Diff (Complete Blood Count)    Result: 07/03/2014 8:16 AM   ( Status: F )       WBC 6.0     4.0-10.5 K/uL SLN   RBC 4.90     4.22-5.81 MIL/uL SLN   Hemoglobin 10.0   L 13.0-17.0 g/dL SLN   Hematocrit 32.4   L 39.0-52.0 % SLN   MCV 66.1   L 78.0-100.0 fL SLN   MCH 20.4   L 26.0-34.0 pg SLN   MCHC 30.9     30.0-36.0 g/dL SLN   RDW 17.3   H 11.5-15.5 % SLN   Platelet Count 145   L 150-400 K/uL SLN   MPV Not Performed     8.6-12.4 fL SLN   Basic Metabolic Panel    Result: 07/03/2014 1:14 AM   ( Status: F )       Sodium 142     135-145 mEq/L SLN   Potassium 4.5     3.5-5.3 mEq/L SLN   Chloride 108     96-112 mEq/L SLN   CO2 19     19-32 mEq/L SLN   Glucose 83     70-99 mg/dL SLN   BUN 28   H 6-23 mg/dL SLN   Creatinine 0.82     0.50-1.35 mg/dL SLN   Calcium 8.6     8.4-10.5 mg/dL SLN   Hemoglobin A1c with eAG    Result: 07/03/2014 1:29 AM   ( Status: F )       Hemoglobin A1C 6.9   H <5.7 % SLN C Estimated Average Glucose 151   H <117 mg/dL SLN   Assessment/Plan  1. Hypertensive heart disease with congestive  heart failure Blood pressure controlled on current regimen   2. Chronic systolic CHF (congestive heart failure) -conts on coreg, no signs of fluid overload or worsening heart failure noted  3. Gastroesophageal reflux disease without esophagitis Denies symptoms, cont on omeprazole  4. Constipation, unspecified constipation type -well controlled on current regimen  5. Diabetes mellitus type 2, controlled, without complications Diet controlled. Following A1c which is in appropriate range at this time.   6. BPH (benign prostatic hyperplasia) - conts on flomax  7. Acute cystitis without hematuria Completed Augmentin tolerated medication well, no signs of ongoing UTI

## 2014-10-10 ENCOUNTER — Encounter: Payer: Self-pay | Admitting: Nurse Practitioner

## 2014-10-10 ENCOUNTER — Non-Acute Institutional Stay (SKILLED_NURSING_FACILITY): Payer: Medicare Other | Admitting: Nurse Practitioner

## 2014-10-10 DIAGNOSIS — N400 Enlarged prostate without lower urinary tract symptoms: Secondary | ICD-10-CM | POA: Diagnosis not present

## 2014-10-10 DIAGNOSIS — D649 Anemia, unspecified: Secondary | ICD-10-CM

## 2014-10-10 DIAGNOSIS — I509 Heart failure, unspecified: Secondary | ICD-10-CM | POA: Diagnosis not present

## 2014-10-10 DIAGNOSIS — I5022 Chronic systolic (congestive) heart failure: Secondary | ICD-10-CM

## 2014-10-10 DIAGNOSIS — F0391 Unspecified dementia with behavioral disturbance: Secondary | ICD-10-CM | POA: Diagnosis not present

## 2014-10-10 DIAGNOSIS — I110 Hypertensive heart disease with heart failure: Secondary | ICD-10-CM | POA: Diagnosis not present

## 2014-10-10 DIAGNOSIS — K219 Gastro-esophageal reflux disease without esophagitis: Secondary | ICD-10-CM | POA: Diagnosis not present

## 2014-10-10 DIAGNOSIS — K5900 Constipation, unspecified: Secondary | ICD-10-CM

## 2014-10-10 DIAGNOSIS — F03918 Unspecified dementia, unspecified severity, with other behavioral disturbance: Secondary | ICD-10-CM

## 2014-10-10 NOTE — Progress Notes (Signed)
Patient ID: Riley Martinez, male   DOB: 09-21-1914, 81 y.o.   MRN: [id number]    Nursing Home Location:  [facility]   Place of Service: SNF (31)  PCP: No primary care provider on file.  Allergies  Allergen Reactions  . Ativan [Lorazepam] Other (See Comments)    unknown    Chief Complaint  Patient presents with  . Medical Management of Chronic Issues    Routine Visit     HPI:  Patient is a 81 y.o. male seen today at [facility] for routine follow up on chronic conditions. Pt with a PMH of dementia, HTN, BPH (s/p TURP) GERD, CHF. Pt with neurology consult place per sons request. Pt talking less and not as responsive.  Ongoing education provided to son regarding pts dementia and disease progression. Wanting another opinion. Otherwise pt has been doing well in the last month. No decrease in appetite, care needs remain the same. No complaints of dysuria or constipation.  Staff without concerns.  Review of Systems:  Review of Systems  Constitutional: Negative for activity change, appetite change, fatigue and unexpected weight change.  HENT: Negative for congestion, hearing loss and rhinorrhea.   Eyes: Negative.   Respiratory: Negative for cough, shortness of breath and wheezing.   Cardiovascular: Negative for chest pain, palpitations and leg swelling.  Gastrointestinal: Negative for abdominal pain, diarrhea and constipation.  Genitourinary: Positive for frequency (reports no changes in frequency). Negative for dysuria, hematuria and difficulty urinating.  Musculoskeletal: Negative for myalgias and arthralgias.  Skin: Negative for color change and wound.  Neurological: Negative for dizziness and weakness.  Psychiatric/Behavioral: Positive for confusion (poor memory) and decreased concentration (reports he is bored). Negative for behavioral problems, sleep disturbance and agitation.    Past Medical History  Diagnosis Date  . GERD (gastroesophageal  reflux disease)   . Hypertension   . BPH (benign prostatic hyperplasia)   . Diabetes mellitus without complication   . Constipation    Past Surgical History  Procedure Laterality Date  . Hip surgery    . Hernia repair     Social History:   reports that he has quit smoking. He has never used smokeless tobacco. He reports that he does not drink alcohol or use illicit drugs.  Family History  Problem Relation Age of Onset  . Diabetes Mellitus II Mother     Medications: Patient's Medications  New Prescriptions   No medications on file  Previous Medications   ACETAMINOPHEN (TYLENOL) 500 MG TABLET    Take 1,000 mg by mouth every 6 (six) hours as needed for mild pain.    ASPIRIN 81 MG TABLET    Take 81 mg by mouth daily.   BETA CAROTENE W/MINERALS (OCUVITE) TABLET    Take 1 tablet by mouth daily. For vision   BISACODYL (DULCOLAX) 10 MG SUPPOSITORY    Place 10 mg rectally daily. If constipation is not relieved by by MOM give 10 mg Bisacodyl suppository rectally x 1 dose in 24 hours as needed   CARVEDILOL (COREG) 3.125 MG TABLET    Take 3.125 mg by mouth 2 (two) times daily with a meal.   CLOTRIMAZOLE (LOTRIMIN) 1 % CREAM    Apply 1 application topically 2 (two) times daily.   EMOLLIENT (GOLD BOND ULTIMATE) LOTN    Apply topically. Apply to back and chest daily   FERROUS SULFATE 325 (65 FE) MG TABLET    Take 325 mg by mouth 2 (two) times daily with  a meal.   FINASTERIDE (PROSCAR) 5 MG TABLET    Take 5 mg by mouth daily.   FLUOCINONIDE CREAM (LIDEX) 0.05 %    Apply 1 application topically 2 (two) times daily. Monday through Thursday to legs   FLUTICASONE (FLONASE) 50 MCG/ACT NASAL SPRAY    Place 1 spray into both nostrils daily.   HYDROCORTISONE CREAM 0.5 %    Apply 1 application topically daily as needed for itching.   HYDROXYZINE (ATARAX/VISTARIL) 25 MG TABLET    Take 25 mg by mouth every 6 (six) hours as needed for itching.   HYDROXYZINE (VISTARIL) 50 MG CAPSULE    Take 50 mg by mouth at  bedtime. Scheduled   LISINOPRIL (PRINIVIL,ZESTRIL) 5 MG TABLET    1/2 tablet by mouth daily   LORATADINE (CLARITIN) 10 MG TABLET    Take 10 mg by mouth daily. For Allergies   MIRTAZAPINE (REMERON) 15 MG TABLET    Take 15 mg by mouth at bedtime.   OMEPRAZOLE (PRILOSEC) 20 MG CAPSULE    Take 20 mg by mouth daily.   POLYVINYL ALCOHOL (LIQUIFILM TEARS) 1.4 % OPHTHALMIC SOLUTION    Place 1 drop into both eyes 3 (three) times daily. For dry eyes   PROMETHAZINE (PHENERGAN) 25 MG SUPPOSITORY    Place 25 mg rectally every 6 (six) hours as needed for nausea or vomiting (Notify MD if symptoms persist for more than 24 hours).   PROMETHAZINE (PHENERGAN) 25 MG TABLET    Take 25 mg by mouth every 6 (six) hours as needed for nausea or vomiting.   SENNOSIDES-DOCUSATE SODIUM (SENOKOT-S) 8.6-50 MG TABLET    Take 2 tablets by mouth 2 (two) times daily.   TAMSULOSIN (FLOMAX) 0.4 MG CAPS CAPSULE    Take 0.4 mg by mouth daily after supper.    VITAMIN B-12 (CYANOCOBALAMIN) 1000 MCG TABLET    Take 1,000 mcg by mouth daily. For supplement  Modified Medications   No medications on file  Discontinued Medications   ALBUTEROL (PROVENTIL) (2.5 MG/3ML) 0.083% NEBULIZER SOLUTION    Take 2.5 mg by nebulization every 8 (eight) hours as needed for wheezing or shortness of breath.    CARBAMIDE PEROXIDE (DEBROX) 6.5 % OTIC SOLUTION    Place 5 drops into both ears daily as needed (cerum impaction).    DESONIDE (DESOWEN) 0.05 % LOTION    Apply 1 application topically 2 (two) times daily. Monday - Thursday - to lower legs   FEEDING SUPPLEMENT, ENSURE, (ENSURE) PUDG    Take 1 Container by mouth 3 (three) times daily between meals.   POLYETHYLENE GLYCOL (MIRALAX / GLYCOLAX) PACKET    Take 17 g by mouth daily as needed for moderate constipation.      Physical Exam: Filed Vitals:   10/10/14 1107  BP: 129/77  Pulse: 60  Temp: 98.5 F (36.9 C)  TempSrc: Oral  Resp: 20  Weight: 170 lb 9.6 oz (77.384 kg)    Physical Exam    Constitutional: He appears well-developed and well-nourished. No distress.  HENT:  Head: Normocephalic and atraumatic.  Mouth/Throat: Oropharynx is clear and moist. No oropharyngeal exudate.  Eyes: Conjunctivae and EOM are normal. Pupils are equal, round, and reactive to light.  Neck: Normal range of motion. Neck supple.  Cardiovascular: Normal rate, regular rhythm and normal heart sounds.   Pulmonary/Chest: Effort normal.  Abdominal: Soft. Bowel sounds are normal.  Musculoskeletal: He exhibits no edema or tenderness.  Neurological: He is alert.  STML  Skin: Skin is warm  and dry. He is not diaphoretic.  Psychiatric: He has a normal mood and affect.    Labs reviewed: Basic Metabolic Panel:  Recent Labs  16/10/96  NA 142  K 4.5  BUN 28*  CREATININE 0.8   Liver Function Tests: No results for input(s): AST, ALT, ALKPHOS, BILITOT, PROT, ALBUMIN in the last 8760 hours. No results for input(s): LIPASE, AMYLASE in the last 8760 hours. No results for input(s): AMMONIA in the last 8760 hours. CBC:  Recent Labs  08/17/14  WBC 4.2  HGB 9.4*  HCT 29*  PLT 172   TSH: No results for input(s): TSH in the last 8760 hours. A1C: Lab Results  Component Value Date   HGBA1C 6.9* 07/03/2014   Lipid Panel: No results for input(s): CHOL, HDL, LDLCALC, TRIG, CHOLHDL, LDLDIRECT in the last 8760 hours.   Assessment/Plan 1. Chronic systolic CHF (congestive heart failure) Euvolemic, not on diuretic at this time, conts on lisinopril and coreg  2. BPH (benign prostatic hyperplasia) -with chronic frequency, currently stable.  -conts on proscar and flomax  3. Anemia, unspecified anemia type -cbc reviewed, hgb stable, conts on iron twice daily  4. Dementia with behavioral disturbance -progressive decline which is to be expected. Son educated but would like neurology referral which has been placed.   5. Constipation, unspecified constipation type -  Well controlled on current  regimen  6. Gastroesophageal reflux disease without esophagitis No complaints of GERD, conts on prilosec daily  7. Hypertensive heart disease with congestive heart failure Blood pressure controlled on lisinopril and coreg     Bentzion Dauria K. Biagio Borg  [facility] [phone] 8 am - 5 pm) [phone] (after hours)

## 2014-11-19 ENCOUNTER — Non-Acute Institutional Stay (SKILLED_NURSING_FACILITY): Payer: Medicare Other | Admitting: Nurse Practitioner

## 2014-11-19 DIAGNOSIS — K5900 Constipation, unspecified: Secondary | ICD-10-CM

## 2014-11-19 DIAGNOSIS — N400 Enlarged prostate without lower urinary tract symptoms: Secondary | ICD-10-CM

## 2014-11-19 DIAGNOSIS — F03918 Unspecified dementia, unspecified severity, with other behavioral disturbance: Secondary | ICD-10-CM

## 2014-11-19 DIAGNOSIS — E119 Type 2 diabetes mellitus without complications: Secondary | ICD-10-CM

## 2014-11-19 DIAGNOSIS — K219 Gastro-esophageal reflux disease without esophagitis: Secondary | ICD-10-CM | POA: Diagnosis not present

## 2014-11-19 DIAGNOSIS — I5022 Chronic systolic (congestive) heart failure: Secondary | ICD-10-CM | POA: Diagnosis not present

## 2014-11-19 DIAGNOSIS — F0391 Unspecified dementia with behavioral disturbance: Secondary | ICD-10-CM | POA: Diagnosis not present

## 2014-11-19 DIAGNOSIS — N182 Chronic kidney disease, stage 2 (mild): Secondary | ICD-10-CM

## 2014-11-19 NOTE — Progress Notes (Signed)
Patient ID: Riley Martinez, male   DOB: 08-Aug-1914, 81 y.o.   MRN: [id number]    Nursing Home Location:  [place] of Service: SNF (31)  PCP: No primary care provider on file.  Allergies  Allergen Reactions  . Ativan [Lorazepam] Other (See Comments)    unknown    Chief Complaint  Patient presents with  . Medical Management of Chronic Issues    HPI:  Patient is a 81 y.o. male seen today at [facility] for routine follow up on chronic conditions. Pt with a PMH of dementia, HTN, BPH (s/p TURP) GERD, CHF. Pt has been doing well in the last month. No acute issues or changes per nursing. Pt in his room in bed during exam. Has no complaints, reports he is doing fine. No problems with bowel or bladder. Does not report increase in frequency or dysuria.    Review of Systems:  Review of Systems  Constitutional: Negative for activity change, appetite change, fatigue and unexpected weight change.  HENT: Negative for congestion, hearing loss and rhinorrhea.   Eyes: Negative.   Respiratory: Negative for cough, shortness of breath and wheezing.   Cardiovascular: Negative for chest pain, palpitations and leg swelling.  Gastrointestinal: Negative for abdominal pain, diarrhea and constipation.  Genitourinary: Negative for dysuria, hematuria and difficulty urinating. Frequency: reports no changes in frequency.  Musculoskeletal: Negative for myalgias and arthralgias.  Skin: Negative for color change and wound.  Neurological: Negative for dizziness and weakness.  Psychiatric/Behavioral: Positive for confusion (poor memory). Negative for behavioral problems, sleep disturbance and agitation.    Past Medical History  Diagnosis Date  . GERD (gastroesophageal reflux disease)   . Hypertension   . BPH (benign prostatic hyperplasia)   . Diabetes mellitus without complication   . Constipation    Past Surgical History  Procedure Laterality Date  . Hip surgery     . Hernia repair     Social History:   reports that he has quit smoking. He has never used smokeless tobacco. He reports that he does not drink alcohol or use illicit drugs.  Family History  Problem Relation Age of Onset  . Diabetes Mellitus II Mother     Medications: Patient's Medications  New Prescriptions   No medications on file  Previous Medications   ACETAMINOPHEN (TYLENOL) 500 MG TABLET    Take 1,000 mg by mouth every 6 (six) hours as needed for mild pain.    ASPIRIN 81 MG TABLET    Take 81 mg by mouth daily.   BETA CAROTENE W/MINERALS (OCUVITE) TABLET    Take 1 tablet by mouth daily. For vision   BISACODYL (DULCOLAX) 10 MG SUPPOSITORY    Place 10 mg rectally daily. If constipation is not relieved by by MOM give 10 mg Bisacodyl suppository rectally x 1 dose in 24 hours as needed   CARVEDILOL (COREG) 3.125 MG TABLET    Take 3.125 mg by mouth 2 (two) times daily with a meal.   CLOTRIMAZOLE (LOTRIMIN) 1 % CREAM    Apply 1 application topically 2 (two) times daily.   EMOLLIENT (GOLD BOND ULTIMATE) LOTN    Apply topically. Apply to back and chest daily   FERROUS SULFATE 325 (65 FE) MG TABLET    Take 325 mg by mouth 2 (two) times daily with a meal.   FINASTERIDE (PROSCAR) 5 MG TABLET    Take 5 mg by mouth daily.   FLUOCINONIDE CREAM (LIDEX) 0.05 %  Apply 1 application topically 2 (two) times daily. Monday through Thursday to legs   FLUTICASONE (FLONASE) 50 MCG/ACT NASAL SPRAY    Place 1 spray into both nostrils daily.   HYDROCORTISONE CREAM 0.5 %    Apply 1 application topically daily as needed for itching.   HYDROXYZINE (ATARAX/VISTARIL) 25 MG TABLET    Take 25 mg by mouth every 6 (six) hours as needed for itching.   HYDROXYZINE (VISTARIL) 50 MG CAPSULE    Take 25 mg by mouth at bedtime. Scheduled   LISINOPRIL (PRINIVIL,ZESTRIL) 5 MG TABLET    1/2 tablet by mouth daily   LORATADINE (CLARITIN) 10 MG TABLET    Take 10 mg by mouth daily. For Allergies   MIRTAZAPINE (REMERON) 15 MG  TABLET    Take 15 mg by mouth at bedtime.   OMEPRAZOLE (PRILOSEC) 20 MG CAPSULE    Take 20 mg by mouth daily.   POLYVINYL ALCOHOL (LIQUIFILM TEARS) 1.4 % OPHTHALMIC SOLUTION    Place 1 drop into both eyes 3 (three) times daily. For dry eyes   PROMETHAZINE (PHENERGAN) 25 MG SUPPOSITORY    Place 25 mg rectally every 6 (six) hours as needed for nausea or vomiting (Notify MD if symptoms persist for more than 24 hours).   PROMETHAZINE (PHENERGAN) 25 MG TABLET    Take 25 mg by mouth every 6 (six) hours as needed for nausea or vomiting.   SENNOSIDES-DOCUSATE SODIUM (SENOKOT-S) 8.6-50 MG TABLET    Take 2 tablets by mouth 2 (two) times daily.   TAMSULOSIN (FLOMAX) 0.4 MG CAPS CAPSULE    Take 0.4 mg by mouth daily after supper.    VITAMIN B-12 (CYANOCOBALAMIN) 1000 MCG TABLET    Take 1,000 mcg by mouth daily. For supplement  Modified Medications   No medications on file  Discontinued Medications   No medications on file     Physical Exam: Filed Vitals:   11/19/14 1334  BP: 134/72  Pulse: 69  Temp: 97.3 F (36.3 C)  Resp: 20    Physical Exam  Constitutional: He appears well-developed and well-nourished. No distress.  HENT:  Head: Normocephalic and atraumatic.  Mouth/Throat: Oropharynx is clear and moist. No oropharyngeal exudate.  Eyes: Conjunctivae and EOM are normal. Pupils are equal, round, and reactive to light.  Neck: Normal range of motion. Neck supple.  Cardiovascular: Normal rate, regular rhythm and normal heart sounds.   Pulmonary/Chest: Effort normal.  Abdominal: Soft. Bowel sounds are normal.  Musculoskeletal: He exhibits no edema or tenderness.  Neurological: He is alert.  STML  Skin: Skin is warm and dry. He is not diaphoretic.  Psychiatric: He has a normal mood and affect.    Labs reviewed: Basic Metabolic Panel:  Recent Labs  07/03/14  NA 142  K 4.5  BUN 28*  CREATININE 0.8   Liver Function Tests: No results for input(s): AST, ALT, ALKPHOS, BILITOT, PROT,  ALBUMIN in the last 8760 hours. No results for input(s): LIPASE, AMYLASE in the last 8760 hours. No results for input(s): AMMONIA in the last 8760 hours. CBC:  Recent Labs  08/17/14  WBC 4.2  HGB 9.4*  HCT 29*  PLT 172   TSH: No results for input(s): TSH in the last 8760 hours. A1C: Lab Results  Component Value Date   HGBA1C 6.9* 07/03/2014   Lipid Panel: No results for input(s): CHOL, HDL, LDLCALC, TRIG, CHOLHDL, LDLDIRECT in the last 8760 hours.  CBC with Diff    Result: 10/03/2014 2:51 PM   (  Status: F )       WBC 7.1     4.0-10.5 K/uL SLN   RBC 4.44     4.22-5.81 MIL/uL SLN   Hemoglobin 9.0   L 13.0-17.0 g/dL SLN   Hematocrit 28.4   L 39.0-52.0 % SLN   MCV 64.0   L 78.0-100.0 fL SLN   MCH 20.3   L 26.0-34.0 pg SLN   MCHC 31.7     30.0-36.0 g/dL SLN   RDW 15.2     11.5-15.5 % SLN   Platelet Count 179     150-400 K/uL SLN   MPV 10.5     8.6-12.4 fL SLN   Granulocyte % 64     43-77 % SLN   Absolute Gran 4.5     1.7-7.7 K/uL SLN   Lymph % 21     12-46 % SLN   Absolute Lymph 1.5     0.7-4.0 K/uL SLN   Mono % 13   H 3-12 % SLN   Absolute Mono 0.9     0.1-1.0 K/uL SLN   Eos % 2     0-5 % SLN   Absolute Eos 0.1     0.0-0.7 K/uL SLN   Baso % 0     0-1 % SLN   Absolute Baso 0.0     0.0-0.1 K/uL SLN   Smear Review Criteria for review not met  SLN   Comprehensive Metabolic Panel    Result: 10/03/2014 3:23 PM   ( Status: F )       Sodium 139     135-145 mEq/L SLN   Potassium 4.0     3.5-5.3 mEq/L SLN   Chloride 104     96-112 mEq/L SLN   CO2 25     19-32 mEq/L SLN   Glucose 137   H 70-99 mg/dL SLN   BUN 42   H 6-23 mg/dL SLN   Creatinine 1.05     0.50-1.35 mg/dL SLN   Bilirubin, Total 1.3   H 0.2-1.2 mg/dL SLN   Alkaline Phosphatase 43     39-117 U/L SLN   AST/SGOT 33     0-37 U/L SLN   ALT/SGPT 24     0-53 U/L SLN   Total Protein 6.0     6.0-8.3 g/dL SLN   Albumin 3.2   L 3.5-5.2 g/dL SLN   Calcium 8.6 Assessment/Plan 1. Chronic systolic CHF (congestive heart  failure) -euvolemic,conts on lisinopril and coreg.   2. Dementia with behavioral disturbance Advanced dementia with progressive decline. No acute changes to cognitive or functional status over the last month.   3. BPH (benign prostatic hyperplasia) Stable, chronic frequency without change -conts on Proscar and Flomax  4. CKD (chronic kidney disease) stage 2, GFR 60-89 ml/min Cr slightly worse on recent lab work, cont to encourage good hydration, avoiding NSAIDs, will monitor at this time  5. Constipation, unspecified constipation type -without complaints or symptoms of constipation. Cont current regimen  6. Gastroesophageal reflux disease without esophagitis Stable, conts on prilosec daily   7. Diabetes mellitus type 2, controlled, without complications Stable, K5L of 6.9 in April, diet controlled at this time. Will cont to monitor     Aquanetta Schwarz K. Harle Battiest  [facility] [phone] 8 am - 5 pm) [phone] (after hours)

## 2015-01-09 ENCOUNTER — Non-Acute Institutional Stay (SKILLED_NURSING_FACILITY): Payer: Medicare Other | Admitting: Nurse Practitioner

## 2015-01-09 DIAGNOSIS — E119 Type 2 diabetes mellitus without complications: Secondary | ICD-10-CM

## 2015-01-09 DIAGNOSIS — F0391 Unspecified dementia with behavioral disturbance: Secondary | ICD-10-CM

## 2015-01-09 DIAGNOSIS — N182 Chronic kidney disease, stage 2 (mild): Secondary | ICD-10-CM

## 2015-01-09 DIAGNOSIS — D649 Anemia, unspecified: Secondary | ICD-10-CM

## 2015-01-09 DIAGNOSIS — I5022 Chronic systolic (congestive) heart failure: Secondary | ICD-10-CM

## 2015-01-09 DIAGNOSIS — I110 Hypertensive heart disease with heart failure: Secondary | ICD-10-CM | POA: Diagnosis not present

## 2015-01-09 DIAGNOSIS — N400 Enlarged prostate without lower urinary tract symptoms: Secondary | ICD-10-CM | POA: Diagnosis not present

## 2015-01-09 DIAGNOSIS — F03918 Unspecified dementia, unspecified severity, with other behavioral disturbance: Secondary | ICD-10-CM

## 2015-01-09 DIAGNOSIS — K219 Gastro-esophageal reflux disease without esophagitis: Secondary | ICD-10-CM

## 2015-01-09 NOTE — Progress Notes (Signed)
Patient ID: Riley Martinez, male   DOB: [birth year], 81 y.o.   MRN: [id number]    Nursing Home Location:  [place] of Service: SNF (31)  PCP: No primary care provider on file.  Allergies  Allergen Reactions  . Ativan [Lorazepam] Other (See Comments)    unknown    Chief Complaint  Patient presents with  . Medical Management of Chronic Issues    HPI:  Patient is a 81 y.o. male seen today at [facility] for routine follow up on chronic conditions. Pt with a PMH of dementia, HTN, BPH (s/p TURP) GERD, CHF. There has been no acute concerns in the last month. Pt has no complaints and reports he is doing well. No changes in appetite, bowel or bladder. Behaviors have remained stable. No cough, congestion, shortness of breath noted. Nursing without concerns.   Review of Systems:  Review of Systems  Constitutional: Negative for activity change, appetite change, fatigue and unexpected weight change.  HENT: Negative for congestion, hearing loss and rhinorrhea.   Eyes: Negative.   Respiratory: Negative for cough, shortness of breath and wheezing.   Cardiovascular: Negative for chest pain, palpitations and leg swelling.  Gastrointestinal: Negative for abdominal pain, diarrhea and constipation.  Genitourinary: Negative for dysuria, hematuria and difficulty urinating. Frequency: reports no changes in frequency.  Musculoskeletal: Negative for myalgias and arthralgias.  Skin: Negative for color change and wound.  Neurological: Negative for dizziness and weakness.  Psychiatric/Behavioral: Positive for confusion (poor memory). Negative for behavioral problems, sleep disturbance and agitation.    Past Medical History  Diagnosis Date  . GERD (gastroesophageal reflux disease)   . Hypertension   . BPH (benign prostatic hyperplasia)   . Diabetes mellitus without complication   . Constipation    Past Surgical History  Procedure Laterality Date  . Hip  surgery    . Hernia repair     Social History:   reports that he has quit smoking. He has never used smokeless tobacco. He reports that he does not drink alcohol or use illicit drugs.  Family History  Problem Relation Age of Onset  . Diabetes Mellitus II Mother     Medications: Patient's Medications  New Prescriptions   No medications on file  Previous Medications   ACETAMINOPHEN (TYLENOL) 500 MG TABLET    Take 1,000 mg by mouth every 6 (six) hours as needed for mild pain.    ASPIRIN 81 MG TABLET    Take 81 mg by mouth daily.   BETA CAROTENE W/MINERALS (OCUVITE) TABLET    Take 1 tablet by mouth daily. For vision   BISACODYL (DULCOLAX) 10 MG SUPPOSITORY    Place 10 mg rectally daily. If constipation is not relieved by by MOM give 10 mg Bisacodyl suppository rectally x 1 dose in 24 hours as needed   CARVEDILOL (COREG) 3.125 MG TABLET    Take 3.125 mg by mouth 2 (two) times daily with a meal.   CLOTRIMAZOLE (LOTRIMIN) 1 % CREAM    Apply 1 application topically 2 (two) times daily.   EMOLLIENT (GOLD BOND ULTIMATE) LOTN    Apply topically. Apply to back and chest daily   FERROUS SULFATE 325 (65 FE) MG TABLET    Take 325 mg by mouth 2 (two) times daily with a meal.   FINASTERIDE (PROSCAR) 5 MG TABLET    Take 5 mg by mouth daily.   FLUOCINONIDE CREAM (LIDEX) 0.05 %    Apply 1 application topically  2 (two) times daily. Monday through Thursday to legs   FLUTICASONE (FLONASE) 50 MCG/ACT NASAL SPRAY    Place 1 spray into both nostrils daily.   HYDROCORTISONE CREAM 0.5 %    Apply 1 application topically daily as needed for itching.   HYDROXYZINE (ATARAX/VISTARIL) 25 MG TABLET    Take 25 mg by mouth every 6 (six) hours as needed for itching.   HYDROXYZINE (VISTARIL) 50 MG CAPSULE    Take 25 mg by mouth at bedtime. Scheduled   LISINOPRIL (PRINIVIL,ZESTRIL) 5 MG TABLET    1/2 tablet by mouth daily   LORATADINE (CLARITIN) 10 MG TABLET    Take 10 mg by mouth daily. For Allergies   MIRTAZAPINE  (REMERON) 15 MG TABLET    Take 15 mg by mouth at bedtime.   OMEPRAZOLE (PRILOSEC) 20 MG CAPSULE    Take 20 mg by mouth daily.   POLYVINYL ALCOHOL (LIQUIFILM TEARS) 1.4 % OPHTHALMIC SOLUTION    Place 1 drop into both eyes 3 (three) times daily. For dry eyes   PROMETHAZINE (PHENERGAN) 25 MG SUPPOSITORY    Place 25 mg rectally every 6 (six) hours as needed for nausea or vomiting (Notify MD if symptoms persist for more than 24 hours).   PROMETHAZINE (PHENERGAN) 25 MG TABLET    Take 25 mg by mouth every 6 (six) hours as needed for nausea or vomiting.   SENNOSIDES-DOCUSATE SODIUM (SENOKOT-S) 8.6-50 MG TABLET    Take 2 tablets by mouth 2 (two) times daily.   TAMSULOSIN (FLOMAX) 0.4 MG CAPS CAPSULE    Take 0.4 mg by mouth daily after supper.    VITAMIN B-12 (CYANOCOBALAMIN) 1000 MCG TABLET    Take 1,000 mcg by mouth daily. For supplement  Modified Medications   No medications on file  Discontinued Medications   No medications on file     Physical Exam: Filed Vitals:   01/09/15 1626  BP: 128/71  Pulse: 69  Temp: 97.4 F (36.3 C)  Resp: 18  Weight: 169 lb (76.658 kg)    Physical Exam  Constitutional: He appears well-developed and well-nourished. No distress.  HENT:  Head: Normocephalic and atraumatic.  Mouth/Throat: Oropharynx is clear and moist. No oropharyngeal exudate.  Eyes: Conjunctivae and EOM are normal. Pupils are equal, round, and reactive to light.  Neck: Normal range of motion. Neck supple.  Cardiovascular: Normal rate, regular rhythm and normal heart sounds.   Pulmonary/Chest: Effort normal.  Abdominal: Soft. Bowel sounds are normal.  Musculoskeletal: He exhibits no edema or tenderness.  Neurological: He is alert.  STML  Skin: Skin is warm and dry. He is not diaphoretic.  Psychiatric: He has a normal mood and affect.    Labs reviewed: Basic Metabolic Panel:  Recent Labs  07/03/14  NA 142  K 4.5  BUN 28*  CREATININE 0.8   Liver Function Tests: No results for  input(s): AST, ALT, ALKPHOS, BILITOT, PROT, ALBUMIN in the last 8760 hours. No results for input(s): LIPASE, AMYLASE in the last 8760 hours. No results for input(s): AMMONIA in the last 8760 hours. CBC:  Recent Labs  08/17/14  WBC 4.2  HGB 9.4*  HCT 29*  PLT 172   TSH: No results for input(s): TSH in the last 8760 hours. A1C: Lab Results  Component Value Date   HGBA1C 6.9* 07/03/2014   Lipid Panel: No results for input(s): CHOL, HDL, LDLCALC, TRIG, CHOLHDL, LDLDIRECT in the last 8760 hours.  CBC with Diff    Result: 10/03/2014 2:51 PM   (  Status: F )       WBC 7.1     4.0-10.5 K/uL SLN   RBC 4.44     4.22-5.81 MIL/uL SLN   Hemoglobin 9.0   L 13.0-17.0 g/dL SLN   Hematocrit 28.4   L 39.0-52.0 % SLN   MCV 64.0   L 78.0-100.0 fL SLN   MCH 20.3   L 26.0-34.0 pg SLN   MCHC 31.7     30.0-36.0 g/dL SLN   RDW 15.2     11.5-15.5 % SLN   Platelet Count 179     150-400 K/uL SLN   MPV 10.5     8.6-12.4 fL SLN   Granulocyte % 64     43-77 % SLN   Absolute Gran 4.5     1.7-7.7 K/uL SLN   Lymph % 21     12-46 % SLN   Absolute Lymph 1.5     0.7-4.0 K/uL SLN   Mono % 13   H 3-12 % SLN   Absolute Mono 0.9     0.1-1.0 K/uL SLN   Eos % 2     0-5 % SLN   Absolute Eos 0.1     0.0-0.7 K/uL SLN   Baso % 0     0-1 % SLN   Absolute Baso 0.0     0.0-0.1 K/uL SLN   Smear Review Criteria for review not met  SLN   Comprehensive Metabolic Panel    Result: 10/03/2014 3:23 PM   ( Status: F )       Sodium 139     135-145 mEq/L SLN   Potassium 4.0     3.5-5.3 mEq/L SLN   Chloride 104     96-112 mEq/L SLN   CO2 25     19-32 mEq/L SLN   Glucose 137   H 70-99 mg/dL SLN   BUN 42   H 6-23 mg/dL SLN   Creatinine 1.05     0.50-1.35 mg/dL SLN   Bilirubin, Total 1.3   H 0.2-1.2 mg/dL SLN   Alkaline Phosphatase 43     39-117 U/L SLN   AST/SGOT 33     0-37 U/L SLN   ALT/SGPT 24     0-53 U/L SLN   Total Protein 6.0     6.0-8.3 g/dL SLN   Albumin 3.2   L 3.5-5.2 g/dL SLN   Calcium 8.6 Assessment/Plan 1.  Controlled type 2 diabetes mellitus without complication, without long-term current use of insulin (HCC) -diet controlled, A1c previously at goal. Will follow up at this time.   2. BPH (benign prostatic hyperplasia) Stable, without increase frequency at this time. conts on flomax daily   3. CKD (chronic kidney disease) stage 2, GFR 60-89 ml/min Will follow up bmp  4. Dementia with behavioral disturbance -stable at this time, no acute changes in cognitive or functional status.   5. Gastroesophageal reflux disease without esophagitis -stable conts on prilosec 20 mg daily   6. Chronic systolic CHF (congestive heart failure) (HCC) Evolemic, remains on betablocker and ace. Not currently on diuretic. Weights have been stable.   7. Hypertensive heart disease with congestive heart failure (HCC) Blood pressure remains stable, conts on coreg and lisinopril, will follow up bmp  8. Anemia, unspecified anemia type conts on iron twice daily, will follow up [place]. Harle Battiest  [facility] [phone] 8 am - 5 pm) [phone] (after hours)

## 2015-01-13 LAB — CBC AND DIFFERENTIAL
HCT: 32 % — AB (ref 41–53)
Hemoglobin: 9.9 g/dL — AB (ref 13.5–17.5)
PLATELETS: 148 10*3/uL — AB (ref 150–399)
WBC: 6.7 10^3/mL

## 2015-01-13 LAB — HEMOGLOBIN A1C: Hemoglobin A1C: 6.2

## 2015-01-13 LAB — BASIC METABOLIC PANEL
BUN: 32 mg/dL — AB (ref 4–21)
CREATININE: 1 mg/dL (ref 0.6–1.3)
Glucose: 96 mg/dL
Potassium: 4.3 mmol/L (ref 3.4–5.3)
SODIUM: 143 mmol/L (ref 137–147)

## 2015-02-09 ENCOUNTER — Non-Acute Institutional Stay (SKILLED_NURSING_FACILITY): Payer: Medicare Other | Admitting: Nurse Practitioner

## 2015-02-09 DIAGNOSIS — N400 Enlarged prostate without lower urinary tract symptoms: Secondary | ICD-10-CM | POA: Diagnosis not present

## 2015-02-09 DIAGNOSIS — F0391 Unspecified dementia with behavioral disturbance: Secondary | ICD-10-CM | POA: Diagnosis not present

## 2015-02-09 DIAGNOSIS — K219 Gastro-esophageal reflux disease without esophagitis: Secondary | ICD-10-CM | POA: Diagnosis not present

## 2015-02-09 DIAGNOSIS — N390 Urinary tract infection, site not specified: Secondary | ICD-10-CM

## 2015-02-09 DIAGNOSIS — N182 Chronic kidney disease, stage 2 (mild): Secondary | ICD-10-CM | POA: Diagnosis not present

## 2015-02-09 DIAGNOSIS — I5022 Chronic systolic (congestive) heart failure: Secondary | ICD-10-CM

## 2015-02-09 DIAGNOSIS — F03918 Unspecified dementia, unspecified severity, with other behavioral disturbance: Secondary | ICD-10-CM

## 2015-02-09 DIAGNOSIS — E119 Type 2 diabetes mellitus without complications: Secondary | ICD-10-CM

## 2015-02-09 NOTE — Progress Notes (Signed)
Patient ID: Riley Martinez, male   DOB: 08/19/1914, 81 y.o.   MRN: [id number]    Nursing Home Location:  [place] of Service: SNF (31)  PCP: No primary care provider on file.  Allergies  Allergen Reactions  . Ativan [Lorazepam] Other (See Comments)    unknown    Chief Complaint  Patient presents with  . Medical Management of Chronic Issues    HPI:  Patient is a 81 y.o. male seen today at [facility] for routine follow up on chronic conditions. Pt with a PMH of dementia, HTN, BPH (s/p TURP) GERD, CHF. Pt currently being treated for ecoli UTI. Completes Augmentin today. Pt has been doing well. No changes in appetite. No complaints of pain. Cognitive and functional status remain stable. Staff without concerns at this time.    Review of Systems:  Review of Systems  Constitutional: Negative for activity change, appetite change, fatigue and unexpected weight change.  HENT: Negative for congestion, hearing loss and rhinorrhea.   Eyes: Negative.   Respiratory: Negative for cough, shortness of breath and wheezing.   Cardiovascular: Negative for chest pain, palpitations and leg swelling.  Gastrointestinal: Negative for abdominal pain, diarrhea and constipation.  Genitourinary: Negative for dysuria, hematuria and difficulty urinating. Frequency: reports no changes in frequency.  Musculoskeletal: Negative for myalgias and arthralgias.  Skin: Negative for color change and wound.  Neurological: Negative for dizziness and weakness.  Psychiatric/Behavioral: Positive for confusion (poor memory). Negative for behavioral problems, sleep disturbance and agitation.    Past Medical History  Diagnosis Date  . GERD (gastroesophageal reflux disease)   . Hypertension   . BPH (benign prostatic hyperplasia)   . Diabetes mellitus without complication   . Constipation    Past Surgical History  Procedure Laterality Date  . Hip surgery    . Hernia repair       Social History:   reports that he has quit smoking. He has never used smokeless tobacco. He reports that he does not drink alcohol or use illicit drugs.  Family History  Problem Relation Age of Onset  . Diabetes Mellitus II Mother     Medications: Patient's Medications  New Prescriptions   No medications on file  Previous Medications   ACETAMINOPHEN (TYLENOL) 500 MG TABLET    Take 1,000 mg by mouth every 6 (six) hours as needed for mild pain.    ASPIRIN 81 MG TABLET    Take 81 mg by mouth daily.   BETA CAROTENE W/MINERALS (OCUVITE) TABLET    Take 1 tablet by mouth daily. For vision   BISACODYL (DULCOLAX) 10 MG SUPPOSITORY    Place 10 mg rectally daily. If constipation is not relieved by by MOM give 10 mg Bisacodyl suppository rectally x 1 dose in 24 hours as needed   CARVEDILOL (COREG) 3.125 MG TABLET    Take 3.125 mg by mouth 2 (two) times daily with a meal.   CLOTRIMAZOLE (LOTRIMIN) 1 % CREAM    Apply 1 application topically 2 (two) times daily.   EMOLLIENT (GOLD BOND ULTIMATE) LOTN    Apply topically. Apply to back and chest daily   FERROUS SULFATE 325 (65 FE) MG TABLET    Take 325 mg by mouth 2 (two) times daily with a meal.   FINASTERIDE (PROSCAR) 5 MG TABLET    Take 5 mg by mouth daily.   FLUOCINONIDE CREAM (LIDEX) 0.05 %    Apply 1 application topically 2 (two) times daily.  Monday through Thursday to legs   FLUTICASONE (FLONASE) 50 MCG/ACT NASAL SPRAY    Place 1 spray into both nostrils daily.   HYDROCORTISONE CREAM 0.5 %    Apply 1 application topically daily as needed for itching.   HYDROXYZINE (ATARAX/VISTARIL) 25 MG TABLET    Take 25 mg by mouth every 6 (six) hours as needed for itching.   HYDROXYZINE (VISTARIL) 50 MG CAPSULE    Take 25 mg by mouth at bedtime. Scheduled   LISINOPRIL (PRINIVIL,ZESTRIL) 5 MG TABLET    1/2 tablet by mouth daily   LORATADINE (CLARITIN) 10 MG TABLET    Take 10 mg by mouth daily. For Allergies   MIRTAZAPINE (REMERON) 15 MG TABLET    Take 15 mg  by mouth at bedtime.   OMEPRAZOLE (PRILOSEC) 20 MG CAPSULE    Take 20 mg by mouth daily.   POLYVINYL ALCOHOL (LIQUIFILM TEARS) 1.4 % OPHTHALMIC SOLUTION    Place 1 drop into both eyes 3 (three) times daily. For dry eyes   PROMETHAZINE (PHENERGAN) 25 MG SUPPOSITORY    Place 25 mg rectally every 6 (six) hours as needed for nausea or vomiting (Notify MD if symptoms persist for more than 24 hours).   PROMETHAZINE (PHENERGAN) 25 MG TABLET    Take 25 mg by mouth every 6 (six) hours as needed for nausea or vomiting.   SENNOSIDES-DOCUSATE SODIUM (SENOKOT-S) 8.6-50 MG TABLET    Take 2 tablets by mouth 2 (two) times daily.   TAMSULOSIN (FLOMAX) 0.4 MG CAPS CAPSULE    Take 0.4 mg by mouth daily after supper.    VITAMIN B-12 (CYANOCOBALAMIN) 1000 MCG TABLET    Take 1,000 mcg by mouth daily. For supplement  Modified Medications   No medications on file  Discontinued Medications   No medications on file     Physical Exam: Filed Vitals:   02/09/15 1645  BP: 130/72  Pulse: 68  Temp: 97.1 F (36.2 C)  Resp: 20  Weight: 169 lb (76.658 kg)    Physical Exam  Constitutional: He appears well-developed and well-nourished. No distress.  HENT:  Head: Normocephalic and atraumatic.  Mouth/Throat: Oropharynx is clear and moist. No oropharyngeal exudate.  Eyes: Conjunctivae and EOM are normal. Pupils are equal, round, and reactive to light.  Neck: Normal range of motion. Neck supple.  Cardiovascular: Normal rate, regular rhythm and normal heart sounds.   Pulmonary/Chest: Effort normal.  Abdominal: Soft. Bowel sounds are normal.  Musculoskeletal: He exhibits no edema or tenderness.  Neurological: He is alert.  STML  Skin: Skin is warm and dry. He is not diaphoretic.  Psychiatric: He has a normal mood and affect.    Labs reviewed: Basic Metabolic Panel:  Recent Labs  07/03/14  NA 142  K 4.5  BUN 28*  CREATININE 0.8   Liver Function Tests: No results for input(s): AST, ALT, ALKPHOS, BILITOT,  PROT, ALBUMIN in the last 8760 hours. No results for input(s): LIPASE, AMYLASE in the last 8760 hours. No results for input(s): AMMONIA in the last 8760 hours. CBC:  Recent Labs  08/17/14  WBC 4.2  HGB 9.4*  HCT 29*  PLT 172   TSH: No results for input(s): TSH in the last 8760 hours. A1C: Lab Results  Component Value Date   HGBA1C 6.9* 07/03/2014   Lipid Panel: No results for input(s): CHOL, HDL, LDLCALC, TRIG, CHOLHDL, LDLDIRECT in the last 8760 hours.  CBC with Diff    Result: 10/03/2014 2:51 PM   ( Status: F )  WBC 7.1     4.0-10.5 K/uL SLN   RBC 4.44     4.22-5.81 MIL/uL SLN   Hemoglobin 9.0   L 13.0-17.0 g/dL SLN   Hematocrit 28.4   L 39.0-52.0 % SLN   MCV 64.0   L 78.0-100.0 fL SLN   MCH 20.3   L 26.0-34.0 pg SLN   MCHC 31.7     30.0-36.0 g/dL SLN   RDW 15.2     11.5-15.5 % SLN   Platelet Count 179     150-400 K/uL SLN   MPV 10.5     8.6-12.4 fL SLN   Granulocyte % 64     43-77 % SLN   Absolute Gran 4.5     1.7-7.7 K/uL SLN   Lymph % 21     12-46 % SLN   Absolute Lymph 1.5     0.7-4.0 K/uL SLN   Mono % 13   H 3-12 % SLN   Absolute Mono 0.9     0.1-1.0 K/uL SLN   Eos % 2     0-5 % SLN   Absolute Eos 0.1     0.0-0.7 K/uL SLN   Baso % 0     0-1 % SLN   Absolute Baso 0.0     0.0-0.1 K/uL SLN   Smear Review Criteria for review not met  SLN   Comprehensive Metabolic Panel    Result: 10/03/2014 3:23 PM   ( Status: F )       Sodium 139     135-145 mEq/L SLN   Potassium 4.0     3.5-5.3 mEq/L SLN   Chloride 104     96-112 mEq/L SLN   CO2 25     19-32 mEq/L SLN   Glucose 137   H 70-99 mg/dL SLN   BUN 42   H 6-23 mg/dL SLN   Creatinine 1.05     0.50-1.35 mg/dL SLN   Bilirubin, Total 1.3   H 0.2-1.2 mg/dL SLN   Alkaline Phosphatase 43     39-117 U/L SLN   AST/SGOT 33     0-37 U/L SLN   ALT/SGPT 24     0-53 U/L SLN   Total Protein 6.0     6.0-8.3 g/dL SLN   Albumin 3.2   L 3.5-5.2 g/dL SLN   Calcium 8.6  BASIC METABOLIC PANEL Darnelle Going SID: [id number] Final -  Approved 01/13/2015 12:02PM EDT Collected: 01/13/2015 4:48AM EDT TEST FLAG RESULT REF RANGE UNIT Bicarbonate (CO2) 29 21-31 mmol/L BUN (Urea Nitrogen) H 32 7-25 mg/dL BUN/Cr Ratio H 32 11-19 mg/dL Calcium 9.1 8.6-10.3 mg/dL Chloride 106 98-107 mmol/L Creatinine, Serum 1.0 0.6-1.3 mg/dL Glucose 96 65-99 mg/dL Fasting: 65-99 mg/d Non-fasting: 65-125 mg/dL Potassium 4.3 3.5-5.1 mmol/L Sodium 143 136-145 mmol/L  Hemoglobin A1c H 6.2  WBC w/diff 6.7 4.5-10.8 10*3/L RBC 4.89 4.00-6.60 10*6/L Hematocrit L 32.5 45.0-54.0 % Hemoglobin L 9.9 14.0-18.0 g/dL MCV L 66.4 80.0-100.0 fL MCH L 20.1 26.0-35.0 pg MCHC L 30.3 31.0-36.5 g/dL RDW H 16.7 11.0-16.0 % Platelet Count L 148.0 150.0-450.0 10*3/L MPV 9.3 6.5-12.0 fL Neutrophil % 58.7 40.0-80.0 % Lymphocyte % 28.3 13.0-48.0 % Monocyte % 10.4 2.0-12.0 % Eosinophil % 2.2 0.0-8.0 % Basophil % 0.4 0.0-2.0 % Neutrophil # 3.9 1.5-7.6 10*3/L Lymphocyte # 1.9 0.9-5.5 10*3/L Monocyte # 0.7 0.1-1.1 10*3/L Eosinophil # L 0.1 0.2-0.8 10*3/L Basophil # 0.0 0.0-0.3 10*3/L  Assessment/Plan 1. UTI (lower urinary tract infection) Finishes treatment for UTI today, no ongoing complaints  2. Gastroesophageal reflux disease without esophagitis Stable on omeprazole   3. Dementia with behavioral disturbance Without acute change to cognitive or functional status  4. CKD (chronic kidney disease) stage 2, GFR 60-89 ml/min -BUN and CR stable, avoid NSAIDs and dehydration  5. BPH (benign prostatic hyperplasia) -without increase in frequency.   6. Controlled type 2 diabetes mellitus without complication, without long-term current use of insulin (HCC) -a1c of 6.2 diet controlled  7. Chronic systolic CHF (congestive heart failure) (HCC) Stable on coreg   Janeen Watson K. Harle Battiest  [facility] [phone] 8 am - 5 pm) [phone] (after hours)

## 2015-03-13 ENCOUNTER — Non-Acute Institutional Stay (SKILLED_NURSING_FACILITY): Payer: Medicare Other | Admitting: Nurse Practitioner

## 2015-03-13 DIAGNOSIS — K219 Gastro-esophageal reflux disease without esophagitis: Secondary | ICD-10-CM | POA: Diagnosis not present

## 2015-03-13 DIAGNOSIS — I110 Hypertensive heart disease with heart failure: Secondary | ICD-10-CM | POA: Diagnosis not present

## 2015-03-13 DIAGNOSIS — R1310 Dysphagia, unspecified: Secondary | ICD-10-CM

## 2015-03-13 DIAGNOSIS — K5900 Constipation, unspecified: Secondary | ICD-10-CM | POA: Diagnosis not present

## 2015-03-13 DIAGNOSIS — I5022 Chronic systolic (congestive) heart failure: Secondary | ICD-10-CM | POA: Diagnosis not present

## 2015-03-13 DIAGNOSIS — F0391 Unspecified dementia with behavioral disturbance: Secondary | ICD-10-CM | POA: Diagnosis not present

## 2015-03-13 DIAGNOSIS — F03918 Unspecified dementia, unspecified severity, with other behavioral disturbance: Secondary | ICD-10-CM

## 2015-03-13 NOTE — Progress Notes (Signed)
Patient ID: Riley Martinez, male   DOB: 09-11-1914, 81 y.o.   MRN: [id number]    Nursing Home Location:  [place] of Service: SNF (31)  PCP: No primary care provider on file.  Allergies  Allergen Reactions  . Ativan [Lorazepam] Other (See Comments)    unknown    Chief Complaint  Patient presents with  . Medical Management of Chronic Issues    HPI:  Patient is a 81 y.o. male seen today at [facility] for routine follow up on chronic conditions. Pt with a PMH of dementia, HTN, BPH (s/p TURP) GERD, CHF. Pt has been doing well. Pt reports he feels good. Limited ROS and HPI due to dementia, therefore provided by staff and pt. Rash in groin improved. No worsening of itch.  No changes in appetite, or constipation. No complaints of pain. Staff has no concerns.   Review of Systems:  Review of Systems  Constitutional: Negative for activity change, appetite change, fatigue and unexpected weight change.  HENT: Negative for congestion, hearing loss and rhinorrhea.   Eyes: Negative.   Respiratory: Negative for cough, shortness of breath and wheezing.   Cardiovascular: Negative for chest pain, palpitations and leg swelling.  Gastrointestinal: Negative for abdominal pain, diarrhea and constipation.  Genitourinary: Negative for dysuria, hematuria and difficulty urinating. Frequency: reports no changes in frequency.  Musculoskeletal: Negative for myalgias and arthralgias.  Skin: Negative for color change and wound.  Neurological: Negative for dizziness and weakness.  Psychiatric/Behavioral: Positive for confusion (poor memory). Negative for behavioral problems, sleep disturbance and agitation.    Past Medical History  Diagnosis Date  . GERD (gastroesophageal reflux disease)   . Hypertension   . BPH (benign prostatic hyperplasia)   . Diabetes mellitus without complication   . Constipation    Past Surgical History  Procedure Laterality Date  . Hip  surgery    . Hernia repair     Social History:   reports that he has quit smoking. He has never used smokeless tobacco. He reports that he does not drink alcohol or use illicit drugs.  Family History  Problem Relation Age of Onset  . Diabetes Mellitus II Mother     Medications: Patient's Medications  New Prescriptions   No medications on file  Previous Medications   ACETAMINOPHEN (TYLENOL) 500 MG TABLET    Take 1,000 mg by mouth every 6 (six) hours as needed for mild pain.    ASPIRIN 81 MG TABLET    Take 81 mg by mouth daily.   BETA CAROTENE W/MINERALS (OCUVITE) TABLET    Take 1 tablet by mouth daily. For vision   BISACODYL (DULCOLAX) 10 MG SUPPOSITORY    Place 10 mg rectally daily. If constipation is not relieved by by MOM give 10 mg Bisacodyl suppository rectally x 1 dose in 24 hours as needed   CARVEDILOL (COREG) 3.125 MG TABLET    Take 3.125 mg by mouth 2 (two) times daily with a meal.   CLOTRIMAZOLE (LOTRIMIN) 1 % CREAM    Apply 1 application topically 2 (two) times daily.   EMOLLIENT (GOLD BOND ULTIMATE) LOTN    Apply topically. Apply to back and chest daily   FERROUS SULFATE 325 (65 FE) MG TABLET    Take 325 mg by mouth 2 (two) times daily with a meal.   FINASTERIDE (PROSCAR) 5 MG TABLET    Take 5 mg by mouth daily.   FLUOCINONIDE CREAM (LIDEX) 0.05 %  Apply 1 application topically 2 (two) times daily. Monday through Thursday to legs   FLUTICASONE (FLONASE) 50 MCG/ACT NASAL SPRAY    Place 1 spray into both nostrils daily.   HYDROCORTISONE CREAM 0.5 %    Apply 1 application topically daily as needed for itching.   HYDROXYZINE (ATARAX/VISTARIL) 25 MG TABLET    Take 25 mg by mouth every 6 (six) hours as needed for itching.   HYDROXYZINE (VISTARIL) 50 MG CAPSULE    Take 25 mg by mouth at bedtime. Scheduled   LISINOPRIL (PRINIVIL,ZESTRIL) 5 MG TABLET    1/2 tablet by mouth daily   LORATADINE (CLARITIN) 10 MG TABLET    Take 10 mg by mouth daily. For Allergies   MIRTAZAPINE  (REMERON) 15 MG TABLET    Take 15 mg by mouth at bedtime.   OMEPRAZOLE (PRILOSEC) 20 MG CAPSULE    Take 20 mg by mouth daily.   POLYVINYL ALCOHOL (LIQUIFILM TEARS) 1.4 % OPHTHALMIC SOLUTION    Place 1 drop into both eyes 3 (three) times daily. For dry eyes   PROMETHAZINE (PHENERGAN) 25 MG SUPPOSITORY    Place 25 mg rectally every 6 (six) hours as needed for nausea or vomiting (Notify MD if symptoms persist for more than 24 hours).   PROMETHAZINE (PHENERGAN) 25 MG TABLET    Take 25 mg by mouth every 6 (six) hours as needed for nausea or vomiting.   SENNOSIDES-DOCUSATE SODIUM (SENOKOT-S) 8.6-50 MG TABLET    Take 2 tablets by mouth 2 (two) times daily.   TAMSULOSIN (FLOMAX) 0.4 MG CAPS CAPSULE    Take 0.4 mg by mouth daily after supper.    VITAMIN B-12 (CYANOCOBALAMIN) 1000 MCG TABLET    Take 1,000 mcg by mouth daily. For supplement  Modified Medications   No medications on file  Discontinued Medications   No medications on file     Physical Exam: Filed Vitals:   03/13/15 1623  BP: 130/72  Pulse: 80  Temp: 98.4 F (36.9 C)  Resp: 20  Weight: 166 lb (75.297 kg)    Physical Exam  Constitutional: He appears well-developed and well-nourished. No distress.  HENT:  Head: Normocephalic and atraumatic.  Mouth/Throat: Oropharynx is clear and moist. No oropharyngeal exudate.  Eyes: Conjunctivae and EOM are normal. Pupils are equal, round, and reactive to light.  Neck: Normal range of motion. Neck supple.  Cardiovascular: Normal rate, regular rhythm and normal heart sounds.   Pulmonary/Chest: Effort normal.  Abdominal: Soft. Bowel sounds are normal.  Musculoskeletal: He exhibits no edema or tenderness.  Neurological: He is alert.  STML  Skin: Skin is warm and dry. He is not diaphoretic.  Psychiatric: He has a normal mood and affect.    Labs reviewed: Basic Metabolic Panel:  Recent Labs  07/03/14  NA 142  K 4.5  BUN 28*  CREATININE 0.8   Liver Function Tests: No results for  input(s): AST, ALT, ALKPHOS, BILITOT, PROT, ALBUMIN in the last 8760 hours. No results for input(s): LIPASE, AMYLASE in the last 8760 hours. No results for input(s): AMMONIA in the last 8760 hours. CBC:  Recent Labs  08/17/14  WBC 4.2  HGB 9.4*  HCT 29*  PLT 172   TSH: No results for input(s): TSH in the last 8760 hours. A1C: Lab Results  Component Value Date   HGBA1C 6.9* 07/03/2014   Lipid Panel: No results for input(s): CHOL, HDL, LDLCALC, TRIG, CHOLHDL, LDLDIRECT in the last 8760 hours.  CBC with Diff    Result:  10/03/2014 2:51 PM   ( Status: F )       WBC 7.1     4.0-10.5 K/uL SLN   RBC 4.44     4.22-5.81 MIL/uL SLN   Hemoglobin 9.0   L 13.0-17.0 g/dL SLN   Hematocrit 28.4   L 39.0-52.0 % SLN   MCV 64.0   L 78.0-100.0 fL SLN   MCH 20.3   L 26.0-34.0 pg SLN   MCHC 31.7     30.0-36.0 g/dL SLN   RDW 15.2     11.5-15.5 % SLN   Platelet Count 179     150-400 K/uL SLN   MPV 10.5     8.6-12.4 fL SLN   Granulocyte % 64     43-77 % SLN   Absolute Gran 4.5     1.7-7.7 K/uL SLN   Lymph % 21     12-46 % SLN   Absolute Lymph 1.5     0.7-4.0 K/uL SLN   Mono % 13   H 3-12 % SLN   Absolute Mono 0.9     0.1-1.0 K/uL SLN   Eos % 2     0-5 % SLN   Absolute Eos 0.1     0.0-0.7 K/uL SLN   Baso % 0     0-1 % SLN   Absolute Baso 0.0     0.0-0.1 K/uL SLN   Smear Review Criteria for review not met  SLN   Comprehensive Metabolic Panel    Result: 10/03/2014 3:23 PM   ( Status: F )       Sodium 139     135-145 mEq/L SLN   Potassium 4.0     3.5-5.3 mEq/L SLN   Chloride 104     96-112 mEq/L SLN   CO2 25     19-32 mEq/L SLN   Glucose 137   H 70-99 mg/dL SLN   BUN 42   H 6-23 mg/dL SLN   Creatinine 1.05     0.50-1.35 mg/dL SLN   Bilirubin, Total 1.3   H 0.2-1.2 mg/dL SLN   Alkaline Phosphatase 43     39-117 U/L SLN   AST/SGOT 33     0-37 U/L SLN   ALT/SGPT 24     0-53 U/L SLN   Total Protein 6.0     6.0-8.3 g/dL SLN   Albumin 3.2   L 3.5-5.2 g/dL SLN   Calcium 8.6  BASIC METABOLIC  PANEL Darnelle Going SID: [id number] Final - Approved 01/13/2015 12:02PM EDT Collected: 01/13/2015 4:48AM EDT TEST FLAG RESULT REF RANGE UNIT Bicarbonate (CO2) 29 21-31 mmol/L BUN (Urea Nitrogen) H 32 7-25 mg/dL BUN/Cr Ratio H 32 11-19 mg/dL Calcium 9.1 8.6-10.3 mg/dL Chloride 106 98-107 mmol/L Creatinine, Serum 1.0 0.6-1.3 mg/dL Glucose 96 65-99 mg/dL Fasting: 65-99 mg/d Non-fasting: 65-125 mg/dL Potassium 4.3 3.5-5.1 mmol/L Sodium 143 136-145 mmol/L  Hemoglobin A1c H 6.2  WBC w/diff 6.7 4.5-10.8 10*3/L RBC 4.89 4.00-6.60 10*6/L Hematocrit L 32.5 45.0-54.0 % Hemoglobin L 9.9 14.0-18.0 g/dL MCV L 66.4 80.0-100.0 fL MCH L 20.1 26.0-35.0 pg MCHC L 30.3 31.0-36.5 g/dL RDW H 16.7 11.0-16.0 % Platelet Count L 148.0 150.0-450.0 10*3/L MPV 9.3 6.5-12.0 fL Neutrophil % 58.7 40.0-80.0 % Lymphocyte % 28.3 13.0-48.0 % Monocyte % 10.4 2.0-12.0 % Eosinophil % 2.2 0.0-8.0 % Basophil % 0.4 0.0-2.0 % Neutrophil # 3.9 1.5-7.6 10*3/L Lymphocyte # 1.9 0.9-5.5 10*3/L Monocyte # 0.7 0.1-1.1 10*3/L Eosinophil # L 0.1 0.2-0.8 10*3/L Basophil # 0.0 0.0-0.3 10*3/L  Assessment/Plan  1. Hypertensive heart disease with congestive heart failure ([place]) Blood pressure remains stable on current medications. Will cont current regimen.   2. Chronic systolic CHF (congestive heart failure) (HCC) -CHF remains stable, no signs of volume overload. conts on coreg twice daily   3. Gastroesophageal reflux disease without esophagitis -remains stable, conts prilosec daily   4. Dysphagia Stable, no signs of aspiration  5. Constipation, unspecified constipation type Bowels moving well on current regimen. Will cont current medications  6. Dementia with behavioral disturbance Stable, without acute changes   Treyon Wymore K. Harle Battiest  [facility] [phone] 8 am - 5 pm) [phone] (after hours)

## 2015-04-17 ENCOUNTER — Encounter: Payer: Self-pay | Admitting: Nurse Practitioner

## 2015-04-17 ENCOUNTER — Non-Acute Institutional Stay (SKILLED_NURSING_FACILITY): Payer: Medicare Other | Admitting: Nurse Practitioner

## 2015-04-17 DIAGNOSIS — R627 Adult failure to thrive: Secondary | ICD-10-CM | POA: Diagnosis not present

## 2015-04-17 DIAGNOSIS — F0391 Unspecified dementia with behavioral disturbance: Secondary | ICD-10-CM | POA: Diagnosis not present

## 2015-04-17 DIAGNOSIS — I110 Hypertensive heart disease with heart failure: Secondary | ICD-10-CM | POA: Diagnosis not present

## 2015-04-17 DIAGNOSIS — I5022 Chronic systolic (congestive) heart failure: Secondary | ICD-10-CM

## 2015-04-17 DIAGNOSIS — F03918 Unspecified dementia, unspecified severity, with other behavioral disturbance: Secondary | ICD-10-CM

## 2015-04-17 DIAGNOSIS — K5900 Constipation, unspecified: Secondary | ICD-10-CM | POA: Diagnosis not present

## 2015-04-17 NOTE — Progress Notes (Signed)
Patient ID: Riley Martinez, male   DOB: [birth year], 82 y.o.   MRN: [id number]    Nursing Home Location:  [facility]   Place of Service: SNF (31)  PCP: No primary care provider on file.  Allergies  Allergen Reactions  . Ativan [Lorazepam] Other (See Comments)    unknown    Chief Complaint  Patient presents with  . Medical Management of Chronic Issues    Routine Visit     HPI:  Patient is a 82 y.o. male seen today at [facility] for routine follow up on chronic conditions. Pt with a PMH of dementia, HTN, BPH (s/p TURP) GERD, CHF. Pt has been doing well. There has been no issues in the last month. No acute changes to chronic conditions. Pts dementia and mood remain unchanged. Pt goes to dinning room for meals. Good appetite. Bowels moving without difficulty. No new skin issues. Staff without concerns.  Review of Systems:  Review of Systems  Constitutional: Negative for activity change, appetite change, fatigue and unexpected weight change.  HENT: Negative for congestion, hearing loss and rhinorrhea.   Eyes: Negative.   Respiratory: Negative for cough, shortness of breath and wheezing.   Cardiovascular: Negative for chest pain, palpitations and leg swelling.  Gastrointestinal: Negative for abdominal pain, diarrhea and constipation.  Genitourinary: Negative for dysuria, hematuria and difficulty urinating. Frequency: reports no changes in frequency.  Musculoskeletal: Negative for myalgias and arthralgias.  Skin: Negative for color change and wound.  Neurological: Negative for dizziness and weakness.  Psychiatric/Behavioral: Positive for confusion (poor memory). Negative for behavioral problems, sleep disturbance and agitation.    Past Medical History  Diagnosis Date  . GERD (gastroesophageal reflux disease)   . Hypertension   . BPH (benign prostatic hyperplasia)   . Diabetes mellitus without complication (HCC)   . Constipation    Past Surgical  History  Procedure Laterality Date  . Hip surgery    . Hernia repair     Social History:   reports that he has quit smoking. He has never used smokeless tobacco. He reports that he does not drink alcohol or use illicit drugs.  Family History  Problem Relation Age of Onset  . Diabetes Mellitus II Mother     Medications: Patient's Medications  New Prescriptions   No medications on file  Previous Medications   ACETAMINOPHEN (TYLENOL) 500 MG TABLET    Take 1,000 mg by mouth every 6 (six) hours as needed for mild pain.    ASPIRIN 81 MG TABLET    Take 81 mg by mouth daily.   BETA CAROTENE W/MINERALS (OCUVITE) TABLET    Take 1 tablet by mouth daily. For vision   BISACODYL (DULCOLAX) 10 MG SUPPOSITORY    Place 10 mg rectally daily. If constipation is not relieved by by MOM give 10 mg Bisacodyl suppository rectally x 1 dose in 24 hours as needed   CARVEDILOL (COREG) 3.125 MG TABLET    Take 3.125 mg by mouth 2 (two) times daily with a meal.   CLOTRIMAZOLE (LOTRIMIN) 1 % CREAM    Apply 1 application topically 2 (two) times daily.   EMOLLIENT (GOLD BOND ULTIMATE) LOTN    Apply topically. Apply to back and chest daily   FERROUS SULFATE 325 (65 FE) MG TABLET    Take 325 mg by mouth 2 (two) times daily with a meal.   FINASTERIDE (PROSCAR) 5 MG TABLET    Take 5 mg by mouth daily.   FLUOCINONIDE  CREAM (LIDEX) 0.05 %    Apply 1 application topically 2 (two) times daily. Monday through Thursday to legs   FLUTICASONE (FLONASE) 50 MCG/ACT NASAL SPRAY    Place 1 spray into both nostrils daily.   HYDROXYZINE (ATARAX/VISTARIL) 25 MG TABLET    Take 25 mg by mouth every 6 (six) hours as needed for itching.   HYDROXYZINE (VISTARIL) 50 MG CAPSULE    Take 25 mg by mouth at bedtime. Scheduled   LISINOPRIL (PRINIVIL,ZESTRIL) 5 MG TABLET    1/2 tablet by mouth daily   LORATADINE (CLARITIN) 10 MG TABLET    Take 10 mg by mouth daily. For Allergies   MIRTAZAPINE (REMERON) 15 MG TABLET    Take 15 mg by mouth at  bedtime.   POLYVINYL ALCOHOL (LIQUIFILM TEARS) 1.4 % OPHTHALMIC SOLUTION    Place 1 drop into both eyes daily. For dry eyes   PROMETHAZINE (PHENERGAN) 25 MG SUPPOSITORY    Place 25 mg rectally every 6 (six) hours as needed for nausea or vomiting (Notify MD if symptoms persist for more than 24 hours).   PROMETHAZINE (PHENERGAN) 25 MG TABLET    Take 25 mg by mouth every 6 (six) hours as needed for nausea or vomiting.   SENNOSIDES-DOCUSATE SODIUM (SENOKOT-S) 8.6-50 MG TABLET    Take 2 tablets by mouth at bedtime.    TAMSULOSIN (FLOMAX) 0.4 MG CAPS CAPSULE    Take 0.4 mg by mouth daily after supper.    VITAMIN B-12 (CYANOCOBALAMIN) 1000 MCG TABLET    Take 1,000 mcg by mouth daily. For supplement  Modified Medications   No medications on file  Discontinued Medications   HYDROCORTISONE CREAM 0.5 %    Apply 1 application topically daily as needed for itching.   OMEPRAZOLE (PRILOSEC) 20 MG CAPSULE    Take 20 mg by mouth daily.     Physical Exam: Filed Vitals:   04/17/15 1036  BP: 121/56  Pulse: 82  Temp: 97.2 F (36.2 C)  TempSrc: Oral  Resp: 16  Weight: 166 lb 12.8 oz (75.66 kg)    Physical Exam  Constitutional: He appears well-developed and well-nourished. No distress.  HENT:  Head: Normocephalic and atraumatic.  Mouth/Throat: Oropharynx is clear and moist. No oropharyngeal exudate.  Eyes: Conjunctivae and EOM are normal. Pupils are equal, round, and reactive to light.  Neck: Normal range of motion. Neck supple.  Cardiovascular: Normal rate, regular rhythm and normal heart sounds.   Pulmonary/Chest: Effort normal.  Abdominal: Soft. Bowel sounds are normal.  Musculoskeletal: He exhibits no edema or tenderness.  Neurological: He is alert.  STML  Skin: Skin is warm and dry. He is not diaphoretic.  Psychiatric: He has a normal mood and affect.    Labs reviewed: Basic Metabolic Panel:  Recent Labs  16/10/96 01/13/15  NA 142 143  K 4.5 4.3  BUN 28* 32*  CREATININE 0.8 1.0    Liver Function Tests: No results for input(s): AST, ALT, ALKPHOS, BILITOT, PROT, ALBUMIN in the last 8760 hours. No results for input(s): LIPASE, AMYLASE in the last 8760 hours. No results for input(s): AMMONIA in the last 8760 hours. CBC:  Recent Labs  08/17/14 01/13/15  WBC 4.2 6.7  HGB 9.4* 9.9*  HCT 29* 32*  PLT 172 148*   TSH: No results for input(s): TSH in the last 8760 hours. A1C: Lab Results  Component Value Date   HGBA1C 6.2 01/13/2015   Lipid Panel: No results for input(s): CHOL, HDL, LDLCALC, TRIG, CHOLHDL, LDLDIRECT in  the last 8760 hours.  Assessment/Plan 1. Hypertensive heart disease with congestive heart failure (HCC) Blood pressure stable, conts on coreg  2. Chronic systolic CHF (congestive heart failure) (HCC) Euvolemic, conts on coreg and ASA  3. Constipation, unspecified constipation type -well controlled on current medications, will cont current regimen.  4. Dementia with behavioral disturbance Stable, no acute changes to behavior, cognitive or functional status.  5. FTT (failure to thrive) in adult Weight has remained stable, pt conts with assistance at meal time. conts on remeron.  Will monitor  Alonie Gazzola K. Biagio Borg  [facility] [phone] 8 am - 5 pm) [phone] (after hours)

## 2015-05-15 ENCOUNTER — Encounter: Payer: Self-pay | Admitting: Nurse Practitioner

## 2015-05-15 ENCOUNTER — Non-Acute Institutional Stay (SKILLED_NURSING_FACILITY): Payer: Medicare Other | Admitting: Nurse Practitioner

## 2015-05-15 DIAGNOSIS — L299 Pruritus, unspecified: Secondary | ICD-10-CM | POA: Diagnosis not present

## 2015-05-15 DIAGNOSIS — K219 Gastro-esophageal reflux disease without esophagitis: Secondary | ICD-10-CM | POA: Diagnosis not present

## 2015-05-15 DIAGNOSIS — F03918 Unspecified dementia, unspecified severity, with other behavioral disturbance: Secondary | ICD-10-CM

## 2015-05-15 DIAGNOSIS — N182 Chronic kidney disease, stage 2 (mild): Secondary | ICD-10-CM | POA: Diagnosis not present

## 2015-05-15 DIAGNOSIS — D649 Anemia, unspecified: Secondary | ICD-10-CM | POA: Diagnosis not present

## 2015-05-15 DIAGNOSIS — N400 Enlarged prostate without lower urinary tract symptoms: Secondary | ICD-10-CM | POA: Diagnosis not present

## 2015-05-15 DIAGNOSIS — F0391 Unspecified dementia with behavioral disturbance: Secondary | ICD-10-CM

## 2015-05-15 NOTE — Progress Notes (Signed)
Patient ID: Riley Martinez, male   DOB: 11/24/1914, 82 y.o.   MRN: [id number]    Nursing Home Location:  [facility]   Place of Service: SNF (31)  PCP: No primary care provider on file.  Allergies  Allergen Reactions  . Ativan [Lorazepam] Other (See Comments)    unknown    Chief Complaint  Patient presents with  . Medical Management of Chronic Issues    Routine Visit    HPI:  Patient is a 82 y.o. male seen today at [facility] for routine follow up on chronic conditions. Pt with a PMH of dementia, HTN, BPH (s/p TURP) GERD, CHF. Pt has been doing well. There has been no acute issues in the last month. Chronic conditions have been at baseline. Pt conts to eat well. Bowels moving without difficulty. Mood has been stable. Nursing without acute concerns.  Review of Systems:  Review of Systems  Constitutional: Negative for activity change, appetite change, fatigue and unexpected weight change.  HENT: Negative for congestion, hearing loss and rhinorrhea.   Eyes: Negative.   Respiratory: Negative for cough, shortness of breath and wheezing.   Cardiovascular: Negative for chest pain, palpitations and leg swelling.  Gastrointestinal: Negative for abdominal pain, diarrhea and constipation.  Genitourinary: Negative for dysuria, hematuria and difficulty urinating. Frequency: reports no changes in frequency.  Musculoskeletal: Negative for myalgias and arthralgias.  Skin: Negative for color change and wound.  Neurological: Negative for dizziness and weakness.  Psychiatric/Behavioral: Positive for confusion (poor memory). Negative for behavioral problems, sleep disturbance and agitation.    Past Medical History  Diagnosis Date  . GERD (gastroesophageal reflux disease)   . Hypertension   . BPH (benign prostatic hyperplasia)   . Diabetes mellitus without complication (HCC)   . Constipation    Past Surgical History  Procedure Laterality Date  . Hip surgery     . Hernia repair     Social History:   reports that he has quit smoking. He has never used smokeless tobacco. He reports that he does not drink alcohol or use illicit drugs.  Family History  Problem Relation Age of Onset  . Diabetes Mellitus II Mother     Medications: Patient's Medications  New Prescriptions   No medications on file  Previous Medications   ACETAMINOPHEN (TYLENOL) 500 MG TABLET    Take 1,000 mg by mouth every 6 (six) hours as needed for mild pain.    ASPIRIN 81 MG TABLET    Take 81 mg by mouth daily.   BETA CAROTENE W/MINERALS (OCUVITE) TABLET    Take 1 tablet by mouth daily. For vision   BISACODYL (DULCOLAX) 10 MG SUPPOSITORY    Place 10 mg rectally daily. If constipation is not relieved by by MOM give 10 mg Bisacodyl suppository rectally x 1 dose in 24 hours as needed   CARVEDILOL (COREG) 3.125 MG TABLET    Take 3.125 mg by mouth 2 (two) times daily with a meal.   CLOTRIMAZOLE (LOTRIMIN) 1 % CREAM    Apply 1 application topically 2 (two) times daily.   EMOLLIENT (GOLD BOND ULTIMATE) LOTN    Apply topically. Apply to back and chest daily   FERROUS SULFATE 325 (65 FE) MG TABLET    Take 325 mg by mouth 2 (two) times daily with a meal.   FINASTERIDE (PROSCAR) 5 MG TABLET    Take 5 mg by mouth daily.   FLUOCINONIDE CREAM (LIDEX) 0.05 %    Apply 1  application topically 2 (two) times daily. Monday through Thursday to legs   FLUTICASONE (FLONASE) 50 MCG/ACT NASAL SPRAY    Place 1 spray into both nostrils daily.   HYDROXYZINE (ATARAX/VISTARIL) 25 MG TABLET    Take 25 mg by mouth every 6 (six) hours as needed for itching.   HYDROXYZINE (VISTARIL) 50 MG CAPSULE    Take 25 mg by mouth at bedtime. Scheduled   LISINOPRIL (PRINIVIL,ZESTRIL) 5 MG TABLET    1/2 tablet by mouth daily   LOPERAMIDE (IMODIUM) 2 MG CAPSULE    Give 4 mg for first loose stool, then  after each loose stool PRN. DO NOT EXCEED /24HOURS   LORATADINE (CLARITIN) 10 MG TABLET    Take 10 mg by mouth daily. For  Allergies   MIRTAZAPINE (REMERON) 15 MG TABLET    Take 15 mg by mouth at bedtime.   NYSTATIN (MYCOSTATIN/NYSTOP) 100000 UNIT/GM POWD    Apply to groin and peri area twice a day.   OMEPRAZOLE (PRILOSEC) 20 MG CAPSULE    Take 20 mg by mouth daily.   POLYVINYL ALCOHOL (LIQUIFILM TEARS) 1.4 % OPHTHALMIC SOLUTION    Place 1 drop into both eyes daily. For dry eyes   PROMETHAZINE (PHENERGAN) 25 MG SUPPOSITORY    Place 25 mg rectally every 6 (six) hours as needed for nausea or vomiting (Notify MD if symptoms persist for more than 24 hours).   PROMETHAZINE (PHENERGAN) 25 MG TABLET    Take 25 mg by mouth every 6 (six) hours as needed for nausea or vomiting.   SENNOSIDES-DOCUSATE SODIUM (SENOKOT-S) 8.6-50 MG TABLET    Take 2 tablets by mouth at bedtime.    TAMSULOSIN (FLOMAX) 0.4 MG CAPS CAPSULE    Take 0.4 mg by mouth daily after supper.    VITAMIN B-12 (CYANOCOBALAMIN) 1000 MCG TABLET    Take 1,000 mcg by mouth daily. For supplement  Modified Medications   No medications on file  Discontinued Medications   No medications on file     Physical Exam: Filed Vitals:   05/15/15 1430  BP: 142/69  Pulse: 78  Temp: 97.8 F (36.6 C)  TempSrc: Oral  Resp: 20  Height:  (1.626 m)  Weight: 165 lb 9.6 oz (75.116 kg)    Physical Exam  Constitutional: He appears well-developed and well-nourished. No distress.  HENT:  Head: Normocephalic and atraumatic.  Mouth/Throat: Oropharynx is clear and moist. No oropharyngeal exudate.  Eyes: Conjunctivae and EOM are normal. Pupils are equal, round, and reactive to light.  Neck: Normal range of motion. Neck supple.  Cardiovascular: Normal rate, regular rhythm and normal heart sounds.   Pulmonary/Chest: Effort normal.  Abdominal: Soft. Bowel sounds are normal.  Musculoskeletal: He exhibits no edema or tenderness.  Neurological: He is alert.  STML  Skin: Skin is warm and dry. He is not diaphoretic.  Psychiatric: He has a normal mood and affect.    Labs  reviewed: Basic Metabolic Panel:  Recent Labs  60/45/40 01/13/15  NA 142 143  K 4.5 4.3  BUN 28* 32*  CREATININE 0.8 1.0   Liver Function Tests: No results for input(s): AST, ALT, ALKPHOS, BILITOT, PROT, ALBUMIN in the last 8760 hours. No results for input(s): LIPASE, AMYLASE in the last 8760 hours. No results for input(s): AMMONIA in the last 8760 hours. CBC:  Recent Labs  08/17/14 01/13/15  WBC 4.2 6.7  HGB 9.4* 9.9*  HCT 29* 32*  PLT 172 148*   TSH: No results for input(s): TSH  in the last 8760 hours. A1C: Lab Results  Component Value Date   HGBA1C 6.2 01/13/2015   Lipid Panel: No results for input(s): CHOL, HDL, LDLCALC, TRIG, CHOLHDL, LDLDIRECT in the last 8760 hours.  Assessment/Plan 1. Gastroesophageal reflux disease without esophagitis -stable, will decrease prilosec to 10 mg daily at this time.   2. BPH (benign prostatic hyperplasia) Symptoms have ben stable, conts on proscar  3. Dementia with behavioral disturbance Stable, no worsening of cognitive or functional status noted, behaviors have been stable.   4. Anemia, unspecified anemia type -will follow up CBC   5. CKD (chronic kidney disease) stage 2, GFR 60-89 ml/min Will follow up CMP at this time  6. Itch GDR of hydroxyzine to 25 mg qhs, pt wihtout increase in symptoms. Will dc at this time and monitor.   Janene Harvey. Biagio Borg  [facility] [phone] 8 am - 5 pm) [phone] (after hours)

## 2015-05-22 LAB — CBC AND DIFFERENTIAL
HEMATOCRIT: 29 % — AB (ref 41–53)
HEMOGLOBIN: 9.3 g/dL — AB (ref 13.5–17.5)
Platelets: 193 10*3/uL (ref 150–399)
WBC: 5.7 10^3/mL

## 2015-05-22 LAB — BASIC METABOLIC PANEL
BUN: 20 mg/dL (ref 4–21)
Creatinine: 0.7 mg/dL (ref 0.6–1.3)
Glucose: 120 mg/dL
POTASSIUM: 3.7 mmol/L (ref 3.4–5.3)
SODIUM: 141 mmol/L (ref 137–147)

## 2015-06-25 ENCOUNTER — Encounter: Payer: Self-pay | Admitting: Adult Health

## 2015-06-25 ENCOUNTER — Non-Acute Institutional Stay (SKILLED_NURSING_FACILITY): Payer: Medicare Other | Admitting: Adult Health

## 2015-06-25 DIAGNOSIS — F03918 Unspecified dementia, unspecified severity, with other behavioral disturbance: Secondary | ICD-10-CM

## 2015-06-25 DIAGNOSIS — N400 Enlarged prostate without lower urinary tract symptoms: Secondary | ICD-10-CM | POA: Diagnosis not present

## 2015-06-25 DIAGNOSIS — I5022 Chronic systolic (congestive) heart failure: Secondary | ICD-10-CM | POA: Diagnosis not present

## 2015-06-25 DIAGNOSIS — R1310 Dysphagia, unspecified: Secondary | ICD-10-CM | POA: Diagnosis not present

## 2015-06-25 DIAGNOSIS — F0391 Unspecified dementia with behavioral disturbance: Secondary | ICD-10-CM | POA: Diagnosis not present

## 2015-06-25 DIAGNOSIS — K219 Gastro-esophageal reflux disease without esophagitis: Secondary | ICD-10-CM | POA: Diagnosis not present

## 2015-06-25 DIAGNOSIS — D649 Anemia, unspecified: Secondary | ICD-10-CM | POA: Diagnosis not present

## 2015-06-25 DIAGNOSIS — K5901 Slow transit constipation: Secondary | ICD-10-CM | POA: Diagnosis not present

## 2015-06-25 DIAGNOSIS — I110 Hypertensive heart disease with heart failure: Secondary | ICD-10-CM | POA: Diagnosis not present

## 2015-06-25 NOTE — Progress Notes (Signed)
Patient ID: Riley Martinez, male   DOB: 12/21/1914, 82 y.o.   MRN: [id number]   Facility: [facility]       Allergies  Allergen Reactions  . Ativan [Lorazepam] Other (See Comments)    unknown    Chief Complaint  Patient presents with  . Medical Management of Chronic Issues    Follow up    HPI:  He is a long term resident of this facility being seen for the management of his chronic illnesses. Overall his status is without significant change. He is not voicing any complaints or concerns there are no nursing concerns at this time.    Past Medical History  Diagnosis Date  . GERD (gastroesophageal reflux disease)   . Hypertension   . BPH (benign prostatic hyperplasia)   . Diabetes mellitus without complication (HCC)   . Constipation     Past Surgical History  Procedure Laterality Date  . Hip surgery    . Hernia repair      VITAL SIGNS BP 118/74 mmHg  Pulse 69  Resp 18  Ht 5\' 4"  (1.626 m)  Wt 166 lb 2 oz (75.354 kg)  BMI 28.50 kg/m2  Patient's Medications  New Prescriptions   No medications on file  Previous Medications   ACETAMINOPHEN (TYLENOL) 500 MG TABLET    Take 1,000 mg by mouth every 6 (six) hours as needed for mild pain.    ASPIRIN 81 MG TABLET    Take 81 mg by mouth daily.   BETA CAROTENE W/MINERALS (OCUVITE) TABLET    Take 1 tablet by mouth daily. For vision   BISACODYL (DULCOLAX) 10 MG SUPPOSITORY    Place 10 mg rectally daily. If constipation is not relieved by by MOM give 10 mg Bisacodyl suppository rectally x 1 dose in 24 hours as needed   CARVEDILOL (COREG) 3.125 MG TABLET    Take 3.125 mg by mouth 2 (two) times daily with a meal.   CLOTRIMAZOLE (LOTRIMIN) 1 % CREAM    Apply 1 application topically 2 (two) times daily.   EMOLLIENT (GOLD BOND ULTIMATE) LOTN    Apply topically. Apply to back and chest daily   FERROUS SULFATE 325 (65 FE) MG TABLET    Take 325 mg by mouth 2 (two) times daily with a meal.   FINASTERIDE (PROSCAR) 5 MG TABLET    Take 5 mg  by mouth daily.   FLUOCINONIDE CREAM (LIDEX) 0.05 %    Apply 1 application topically 2 (two) times daily. Monday through Thursday to legs   FLUTICASONE (FLONASE) 50 MCG/ACT NASAL SPRAY    Place 1 spray into both nostrils daily.   HYDROXYZINE (ATARAX/VISTARIL) 25 MG TABLET    Take 25 mg by mouth every 6 (six) hours as needed for itching.   LISINOPRIL (PRINIVIL,ZESTRIL) 5 MG TABLET    1/2 tablet by mouth daily   LOPERAMIDE (IMODIUM) 2 MG CAPSULE    Give 4 mg for first loose stool, then 2mg  after each loose stool PRN. DO NOT EXCEED 8MG /24HOURS   LORATADINE (CLARITIN) 10 MG TABLET    Take 10 mg by mouth daily. For Allergies   MIRTAZAPINE (REMERON) 15 MG TABLET    Take 15 mg by mouth at bedtime.   NUTRITIONAL SUPPLEMENTS (NUTRITIONAL SUPPLEMENT PO)    Take by mouth. Mech soft diet with nectar thick liquids   NYSTATIN (MYCOSTATIN/NYSTOP) 100000 UNIT/GM POWD    Apply to groin and peri area twice a day.   OMEPRAZOLE (PRILOSEC) 20 MG CAPSULE  Take 20 mg by mouth daily.   POLYVINYL ALCOHOL (LIQUIFILM TEARS) 1.4 % OPHTHALMIC SOLUTION    Place 1 drop into both eyes daily. For dry eyes   PROMETHAZINE (PHENERGAN) 25 MG SUPPOSITORY    Place 25 mg rectally every 6 (six) hours as needed for nausea or vomiting (Notify MD if symptoms persist for more than 24 hours).   PROMETHAZINE (PHENERGAN) 25 MG TABLET    Take 25 mg by mouth every 6 (six) hours as needed for nausea or vomiting.   SENNOSIDES-DOCUSATE SODIUM (SENOKOT-S) 8.6-50 MG TABLET    Take 2 tablets by mouth at bedtime.    TAMSULOSIN (FLOMAX) 0.4 MG CAPS CAPSULE    Take 0.4 mg by mouth daily after supper.    VITAMIN B-12 (CYANOCOBALAMIN) 1000 MCG TABLET    Take 1,000 mcg by mouth daily. For supplement  Modified Medications   No medications on file  Discontinued Medications   HYDROXYZINE (VISTARIL) 50 MG CAPSULE    Take 25 mg by mouth at bedtime. Reported on 06/25/2015     SIGNIFICANT DIAGNOSTIC EXAMS  LABS REVIEWED:   05-21-15: wbc 5.7; hgb 9.3; hct  28.7; mcv 64.5 plt 193; glucose 120; bun 20; creat 0.72; k+ 3.7; na++141   Review of Systems  Constitutional: Negative for malaise/fatigue.  Respiratory: Negative for cough and shortness of breath.   Cardiovascular: Negative for chest pain, palpitations and leg swelling.  Gastrointestinal: Negative for heartburn, abdominal pain and constipation.  Musculoskeletal: Negative for myalgias, back pain and joint pain.  Skin: Negative.   Neurological: Negative for dizziness.  Psychiatric/Behavioral: The patient is not nervous/anxious.      Physical Exam  Constitutional: He appears well-developed and well-nourished. No distress.  Eyes: Conjunctivae are normal.  Neck: Neck supple. No JVD present. No thyromegaly present.  Cardiovascular: Normal rate, regular rhythm and intact distal pulses.   Respiratory: Effort normal and breath sounds normal. No respiratory distress. He has no wheezes.  GI: Soft. Bowel sounds are normal. He exhibits no distension. There is no tenderness.  Musculoskeletal: He exhibits no edema.  Able to move all extremities   Lymphadenopathy:    He has no cervical adenopathy.  Neurological: He is alert.  Skin: Skin is warm and dry. He is not diaphoretic.  Psychiatric: He has a normal mood and affect.      ASSESSMENT/ PLAN:  1. Hypertension: will continue lisinopril 2.5 mg daily  Coreg 3.125 mg twice daily asa 81 mg daily   2. Anemia: hgb is 9.3; will continue iron twice daily  3. BPH: will continue proscar 5 mg daily and flomax 0.4 mg daily   4. Dysphagia: no signs of aspiration present will continue nectar thick liquids.   5. Genella Rife: will continue prilosec 20 mg daily   6. Allergic rhinitis: will continue flonase daily and claritin daily   7. Constipation: will continue senna s 2 tabs daily and dulcolax supp daily   8. Chronic systolic heart failure: will continue coreg 3.125 mg twice daily is not on diuretic  9. Dementia: no significant in his status; is  presently not on medications; will continue to monitor his status. Weight is 166 pounds.   10. FTT: current weight is 166 pounds; will continue supplements per facility protocol; is on remeron 15 mg nightly to help with appetite.      Synthia Innocent NP [facility]  Contact [phone] Monday through Friday 8am- 5pm  After hours call [phone]

## 2015-07-22 ENCOUNTER — Non-Acute Institutional Stay (SKILLED_NURSING_FACILITY): Payer: Medicare Other | Admitting: Adult Health

## 2015-07-22 ENCOUNTER — Encounter: Payer: Self-pay | Admitting: Adult Health

## 2015-07-22 DIAGNOSIS — R1310 Dysphagia, unspecified: Secondary | ICD-10-CM | POA: Diagnosis not present

## 2015-07-22 DIAGNOSIS — I5022 Chronic systolic (congestive) heart failure: Secondary | ICD-10-CM | POA: Diagnosis not present

## 2015-07-22 DIAGNOSIS — F0391 Unspecified dementia with behavioral disturbance: Secondary | ICD-10-CM

## 2015-07-22 DIAGNOSIS — F03918 Unspecified dementia, unspecified severity, with other behavioral disturbance: Secondary | ICD-10-CM

## 2015-07-22 DIAGNOSIS — D649 Anemia, unspecified: Secondary | ICD-10-CM | POA: Diagnosis not present

## 2015-07-22 DIAGNOSIS — R627 Adult failure to thrive: Secondary | ICD-10-CM

## 2015-07-22 NOTE — Progress Notes (Signed)
Patient ID: Riley Martinez, male   DOB: Apr 21, 1914, 82 y.o.   MRN: [id number]  Location:  [facility] Room Number: 204 B Place of Service:  SNF (31) Provider:   Peggye Ley, ANP [facility] [phone]   Extended Emergency Contact Information Primary Emergency Contact: Lawal,[name] Address: [address]          [place], [place] [address] [place] Home Phone: [phone] Relation: Son Secondary Emergency Contact: [facility] Address: [address]           [place], [place] [address] [place] Home Phone: [phone] Relation: Son  Code Status:  DNR Goals of care: Advanced Directive information Advanced Directives 07/22/2015  Does patient have an advance directive? Yes  Type of Advance Directive Out of facility DNR (pink MOST or yellow form)  Does patient want to make changes to advanced directive? No - Patient declined  Copy of advanced directive(s) in chart? Yes     Chief Complaint  Patient presents with  . Medical Management of Chronic Issues    Routine Visit    HPI:  Pt is a 82 y.o. male seen today for medical management of chronic diseases.  Resides at [facility] due to advanced dementia and decreased mobility. He has no complaints today.  1. Dysphagia -no bouts of pna on mech soft diet with NTL  2. Chronic systolic CHF (congestive heart failure) (HCC) -weight trending down -no recent echo for review -no sob or cp -on ace and coreg  3. Dementia with behavioral disturbance -advanced, needs assistance for all adls  4. Anemia, unspecified anemia type Lab Results  Component Value Date   HGB 9.3* 05/22/2015   Lab Results  Component Value Date   HCT 29* 05/22/2015  -currently on iron twice a day  5. FTT (failure to thrive) in adult - Wt Readings from Last 3 Encounters:  07/22/15 162 lb 9.6 oz (73.755 kg)  06/25/15 166 lb 2 oz (75.354 kg)  05/15/15 165 lb 9.6 oz (75.116 kg)  -on  remeron      Past Medical History  Diagnosis Date  . GERD (gastroesophageal reflux disease)   . Hypertension   . BPH (benign prostatic hyperplasia)   . Diabetes mellitus without complication (HCC)   . Constipation    Past Surgical History  Procedure Laterality Date  . Hip surgery    . Hernia repair      Allergies  Allergen Reactions  . Ativan [Lorazepam] Other (See Comments)    unknown      Medication List       This list is accurate as of: 07/22/15 10:37 AM.  Always use your most recent med list.               acetaminophen 500 MG tablet  Commonly known as:  TYLENOL  Take 1,000 mg by mouth every 6 (six) hours as needed for mild pain.     aspirin 81 MG tablet  Take 81 mg by mouth daily.     beta carotene w/minerals tablet  Take 1 tablet by mouth daily. For vision     bisacodyl 10 MG suppository  Commonly known as:  DULCOLAX  Place 10 mg rectally daily. If constipation is not relieved by by MOM give 10 mg Bisacodyl suppository rectally x 1 dose in 24 hours as needed     carvedilol 3.125 MG tablet  Commonly known as:  COREG  Take 3.125 mg by mouth 2 (  two) times daily with a meal.     clotrimazole 1 % cream  Commonly known as:  LOTRIMIN  Apply 1 application topically 2 (two) times daily.     ferrous sulfate 325 (65 FE) MG tablet  Take 325 mg by mouth 2 (two) times daily with a meal.     finasteride 5 MG tablet  Commonly known as:  PROSCAR  Take 5 mg by mouth daily.     fluocinonide cream 0.05 %  Commonly known as:  LIDEX  Apply 1 application topically 2 (two) times daily. Monday through Thursday to legs     fluticasone 50 MCG/ACT nasal spray  Commonly known as:  FLONASE  Place 1 spray into both nostrils daily.     GOLD BOND ULTIMATE Lotn  Apply topically. Apply to back and chest daily     hydrOXYzine 25 MG tablet  Commonly known as:  ATARAX/VISTARIL  Take 25 mg by mouth every 6 (six) hours as needed for itching.     lisinopril 5 MG tablet    Commonly known as:  PRINIVIL,ZESTRIL  1/2 tablet by mouth daily     loperamide 2 MG capsule  Commonly known as:  IMODIUM  Give 4 mg for first loose stool, then  after each loose stool PRN. DO NOT EXCEED /24HOURS     loratadine 10 MG tablet  Commonly known as:  CLARITIN  Take 10 mg by mouth daily. For Allergies     mirtazapine 15 MG tablet  Commonly known as:  REMERON  Take 15 mg by mouth at bedtime.     nystatin powder  Generic drug:  nystatin  Apply to groin and peri area twice a day.     omeprazole 20 MG capsule  Commonly known as:  PRILOSEC  Take 20 mg by mouth daily.     polyvinyl alcohol 1.4 % ophthalmic solution  Commonly known as:  LIQUIFILM TEARS  Place 1 drop into both eyes daily. For dry eyes     promethazine 25 MG tablet  Commonly known as:  PHENERGAN  Take 25 mg by mouth every 6 (six) hours as needed for nausea or vomiting.     promethazine 25 MG suppository  Commonly known as:  PHENERGAN  Place 25 mg rectally every 6 (six) hours as needed for nausea or vomiting (Notify MD if symptoms persist for more than 24 hours).     tamsulosin 0.4 MG Caps capsule  Commonly known as:  FLOMAX  Take 0.4 mg by mouth daily after supper.     vitamin B-12 1000 MCG tablet  Commonly known as:  CYANOCOBALAMIN  Take 1,000 mcg by mouth daily. For supplement        Review of Systems  Unable to perform ROS: Dementia    Immunization History  Administered Date(s) Administered  . Influenza-Unspecified 12/31/2013, 01/06/2015  . PPD Test 05/15/2013   Pertinent  Health Maintenance Due  Topic Date Due  . OPHTHALMOLOGY EXAM  05/14/2015  . HEMOGLOBIN A1C  07/14/2015  . PNA vac Low Risk Adult (1 of 2 - PCV13) 05/25/2016 (Originally 10[birth year]04/1981)  . INFLUENZA VACCINE  10/27/2015  . FOOT EXAM  01/19/2016   Fall Risk  05/30/2013  Falls in the past year? Yes  Number falls in past yr: 1  Risk for fall due to : Impaired mobility   Functional Status Survey:    Filed Vitals:    07/22/15 1017  BP: 142/71  Pulse: 79  Temp: 98.5 F (36.9 C)  TempSrc:  Oral  Resp: 20  Height: 5\' 4"  (1.626 m)  Weight: 162 lb 9.6 oz (73.755 kg)   Body mass index is 27.9 kg/(m^2).  Wt Readings from Last 3 Encounters:  07/22/15 162 lb 9.6 oz (73.755 kg)  06/25/15 166 lb 2 oz (75.354 kg)  05/15/15 165 lb 9.6 oz (75.116 kg)    Physical Exam  Constitutional: No distress.  HENT:  Right Ear: External ear normal.  Left Ear: External ear normal.  Nose: Nose normal.  drooling from mouth, HOH  Eyes: Conjunctivae are normal. Right eye exhibits no discharge. Left eye exhibits no discharge.  Neck: No JVD present. No thyromegaly present.  Kyphosis with decreased ROM to neck  Cardiovascular: Normal rate and regular rhythm.   No murmur heard. Pulmonary/Chest: Effort normal.  Bilateral scattered rhonchi  Abdominal: Soft. Bowel sounds are normal. He exhibits no distension.  Musculoskeletal: He exhibits no edema or tenderness.  Decreased ROM to both shoulders, worse on the right, decreased ROM to both knees.  Lymphadenopathy:    He has no cervical adenopathy.  Neurological: He is alert.  Pleasant, follows commands, oriented to self and situation only  Skin: Skin is warm and dry. He is not diaphoretic.  Psychiatric: He has a normal mood and affect.    Labs reviewed:  Recent Labs  01/13/15 05/22/15  NA 143 141  K 4.3 3.7  BUN 32* 20  CREATININE 1.0 0.7   No results for input(s): AST, ALT, ALKPHOS, BILITOT, PROT, ALBUMIN in the last 8760 hours.  Recent Labs  08/17/14 01/13/15 05/22/15  WBC 4.2 6.7 5.7  HGB 9.4* 9.9* 9.3*  HCT 29* 32* 29*  PLT 172 148* 193   Lab Results  Component Value Date   TSH 2.037 05/06/2013   Lab Results  Component Value Date   HGBA1C 6.2 01/13/2015   No results found for: CHOL, HDL, LDLCALC, LDLDIRECT, TRIG, CHOLHDL  Significant Diagnostic Results in last 30 days:  No results found.  Assessment/Plan  1. Dysphagia -no bouts of pna  recently -clearly has difficulty managing secretions/liquids during my exam with excessive drooling/difficult managing liquids -continue mech soft diet with NTL  2. Chronic systolic CHF (congestive heart failure) (HCC) -weight trending down without edema -continue coreg, ace,  and aspiring (see below)  3. Dementia with behavioral disturbance -pleasant for my exam but with advanced dementia -not on meds at this point but he would not likely benefit -no aggressive behaviors  4. Anemia, unspecified anemia type -slight trend downward -check CBC and iron studies -check stool for blood, if positive would not work up due to his age/debility but would discontinue aspirin -continue iron twice a day  5. FTT (failure to thrive) in adult -weight trending down this month, if continues will check TSH -needs assistance with all adl's and has advanced dementia -continue remeron for now  Labs/tests ordered:  CBC, iron studies, heme stool  Peggye Leyhristy Johan Antonacci, ANP [facility] [phone]

## 2015-07-24 LAB — CBC AND DIFFERENTIAL
HEMATOCRIT: 32 % — AB (ref 41–53)
HEMOGLOBIN: 9.9 g/dL — AB (ref 13.5–17.5)
Platelets: 203 10*3/uL (ref 150–399)
WBC: 8.6 10*3/mL

## 2015-08-17 ENCOUNTER — Non-Acute Institutional Stay (SKILLED_NURSING_FACILITY): Payer: Medicare Other | Admitting: Internal Medicine

## 2015-08-17 ENCOUNTER — Encounter: Payer: Self-pay | Admitting: Internal Medicine

## 2015-08-17 DIAGNOSIS — R627 Adult failure to thrive: Secondary | ICD-10-CM | POA: Diagnosis not present

## 2015-08-17 DIAGNOSIS — I5022 Chronic systolic (congestive) heart failure: Secondary | ICD-10-CM | POA: Diagnosis not present

## 2015-08-17 DIAGNOSIS — E119 Type 2 diabetes mellitus without complications: Secondary | ICD-10-CM | POA: Diagnosis not present

## 2015-08-17 DIAGNOSIS — K219 Gastro-esophageal reflux disease without esophagitis: Secondary | ICD-10-CM

## 2015-08-17 DIAGNOSIS — D649 Anemia, unspecified: Secondary | ICD-10-CM | POA: Diagnosis not present

## 2015-08-17 DIAGNOSIS — F0391 Unspecified dementia with behavioral disturbance: Secondary | ICD-10-CM

## 2015-08-17 DIAGNOSIS — F03918 Unspecified dementia, unspecified severity, with other behavioral disturbance: Secondary | ICD-10-CM

## 2015-08-17 DIAGNOSIS — N400 Enlarged prostate without lower urinary tract symptoms: Secondary | ICD-10-CM

## 2015-08-17 DIAGNOSIS — I10 Essential (primary) hypertension: Secondary | ICD-10-CM | POA: Diagnosis not present

## 2015-08-17 NOTE — Progress Notes (Signed)
DATE: 08/17/15  Location:  [facility] Room Number: 204 B Place of Service: SNF (31)   Extended Emergency Contact Information Primary Emergency Contact: Mcdanel,[name] Address: [address]          [place], [place] [address] [place] Home Phone: [phone] Relation: Son Secondary Emergency Contact: Nicholas Lose Address: [address]           [place], [place] [address] [place] Home Phone: [phone] Relation: Son  Advanced Directive information Does patient have an advance directive?: Yes, Type of Advance Directive: Out of facility DNR (pink MOST or yellow form), Does patient want to make changes to advanced directive?: No - Patient declined  Chief Complaint  Patient presents with  . Medical Management of Chronic Issues    Routine Visit    HPI:  82 yo male long term resident seen today for f/u. He has no c/o today. Words are intelligible. No nursing issues. No falls. He is a poor historian due to dementia. Hx obtained from chart.  Hypertension - BP controlled on lisinopril 2.5 mg daily and coreg 3.125 mg twice daily. He takes ASA 81 mg daily   Anemia - chronic. Hgb 9.3. He takes iron twice daily and B12 tabs daily  BPH - stable urinary sx's on proscar 5 mg daily and flomax 0.4 mg daily   Dysphagia - stable with no signs of aspiration at present. He is on nectar thick liquids.   GERD/constipation - stable on prilosec 20 mg daily. He takes  senna s 2 tabs daily and dulcolax supp daily to keep BMs regular  Allergic rhinitis - stable on flonase daily and claritin daily    Chronic systolic heart failure - stable on coreg 3.125 mg twice daily. Currently not on diuretic  Dementia/FTT - likely end stage.  Currently not on medications. Weight is down to 157 lbs (5 lb loss since last month) despite receiving supplements per facility protocol and taking remeron 15 mg nightly  Past Medical History  Diagnosis Date   . GERD (gastroesophageal reflux disease)   . Hypertension   . BPH (benign prostatic hyperplasia)   . Diabetes mellitus without complication (HCC)   . Constipation     Past Surgical History  Procedure Laterality Date  . Hip surgery    . Hernia repair      Patient Care Team: Margit Hanks, MD as Consulting Physician (Internal Medicine)  Social History   Social History  . Marital Status: Divorced    Spouse Name: N/A  . Number of Children: N/A  . Years of Education: N/A   Occupational History  . Not on file.   Social History Main Topics  . Smoking status: Former Games developer  . Smokeless tobacco: Never Used  . Alcohol Use: No     Comment: occasionally has not had for years.  . Drug Use: No  . Sexual Activity: Not on file   Other Topics Concern  . Not on file   Social History Narrative     reports that he has quit smoking. He has never used smokeless tobacco. He reports that he does not drink alcohol or use illicit drugs.  Immunization History  Administered Date(s) Administered  . Influenza-Unspecified 12/31/2013, 01/06/2015  . PPD Test 05/15/2013    Allergies  Allergen Reactions  . Ativan [Lorazepam] Other (See Comments)    unknown    Medications: Patient's Medications  New Prescriptions   No medications on  file  Previous Medications   ACETAMINOPHEN (TYLENOL) 500 MG TABLET    Take 1,000 mg by mouth every 6 (six) hours as needed for mild pain.    AMBULATORY NON FORMULARY MEDICATION    Magic cup three times daily   ASPIRIN 81 MG TABLET    Take 81 mg by mouth daily.   BETA CAROTENE W/MINERALS (OCUVITE) TABLET    Take 1 tablet by mouth daily. For vision   BISACODYL (DULCOLAX) 10 MG SUPPOSITORY    Place 10 mg rectally daily. If constipation is not relieved by by MOM give 10 mg Bisacodyl suppository rectally x 1 dose in 24 hours as needed   CARVEDILOL (COREG) 3.125 MG TABLET    Take 3.125 mg by mouth 2 (two) times daily with a meal.   CLOTRIMAZOLE (LOTRIMIN) 1 %  CREAM    Apply 1 application topically 2 (two) times daily.   EMOLLIENT (GOLD BOND ULTIMATE) LOTN    Apply topically. Apply to back and chest daily   FERROUS SULFATE 325 (65 FE) MG TABLET    Take 325 mg by mouth 2 (two) times daily with a meal.   FINASTERIDE (PROSCAR) 5 MG TABLET    Take 5 mg by mouth daily.   FLUOCINONIDE CREAM (LIDEX) 0.05 %    Apply 1 application topically 2 (two) times daily. Monday through Thursday to legs   FLUTICASONE (FLONASE) 50 MCG/ACT NASAL SPRAY    Place 1 spray into both nostrils daily.   HYDROXYZINE (ATARAX/VISTARIL) 25 MG TABLET    Take 25 mg by mouth every 6 (six) hours as needed for itching.   LISINOPRIL (PRINIVIL,ZESTRIL) 5 MG TABLET    1/2 tablet by mouth daily   LOPERAMIDE (IMODIUM) 2 MG CAPSULE    Give 4 mg for first loose stool, then 2mg  after each loose stool PRN. DO NOT EXCEED 8MG /24HOURS   LORATADINE (CLARITIN) 10 MG TABLET    Take 10 mg by mouth daily. For Allergies   MIRTAZAPINE (REMERON) 15 MG TABLET    Take 15 mg by mouth at bedtime.   NYSTATIN (MYCOSTATIN/NYSTOP) 100000 UNIT/GM POWD    Apply to groin and peri area as needed.   OMEPRAZOLE (PRILOSEC) 20 MG CAPSULE    Take 10 mg by mouth every morning. On an empty stomach   POLYVINYL ALCOHOL (LIQUIFILM TEARS) 1.4 % OPHTHALMIC SOLUTION    Place 1 drop into both eyes daily. For dry eyes   PROMETHAZINE (PHENERGAN) 25 MG SUPPOSITORY    Place 25 mg rectally every 6 (six) hours as needed for nausea or vomiting (Notify MD if symptoms persist for more than 24 hours). Reported on 08/17/2015   PROMETHAZINE (PHENERGAN) 25 MG TABLET    Take 25 mg by mouth every 6 (six) hours as needed for nausea or vomiting.   TAMSULOSIN (FLOMAX) 0.4 MG CAPS CAPSULE    Take 0.4 mg by mouth daily after supper.    VITAMIN B-12 (CYANOCOBALAMIN) 1000 MCG TABLET    Take 1,000 mcg by mouth daily. For supplement  Modified Medications   No medications on file  Discontinued Medications   No medications on file    Review of Systems    Unable to perform ROS: Dementia    Filed Vitals:   08/17/15 1048  BP: 142/74  Pulse: 78  Temp: 98.5 F (36.9 C)  TempSrc: Oral  Resp: 20  Height: 5\' 4"  (1.626 m)  Weight: 157 lb 12.8 oz (71.578 kg)   Body mass index is 27.07 kg/(m^2).  Physical  Exam  Constitutional: He appears well-developed.  Frail appearing in NAD, sitting in w/c  HENT:  Mouth/Throat: Oropharynx is clear and moist.  Eyes: Pupils are equal, round, and reactive to light. No scleral icterus.  Neck: Neck supple. Carotid bruit is not present.  Flexion contracture  Cardiovascular: Normal rate, regular rhythm and intact distal pulses.  Exam reveals no gallop and no friction rub.   Murmur (1/6 SEM) heard. Trace LE edema b/l. No calf TTP  Pulmonary/Chest: Effort normal. He has wheezes (end expiratory). He has no rales. He exhibits no tenderness.  Abdominal: Soft. Bowel sounds are normal. He exhibits no distension, no abdominal bruit, no pulsatile midline mass and no mass. There is no tenderness. There is no rebound and no guarding.  Musculoskeletal: He exhibits edema.  Lymphadenopathy:    He has no cervical adenopathy.  Neurological: He is alert.  Skin: Skin is warm and dry. No rash noted.  Psychiatric: He has a normal mood and affect. His behavior is normal. His speech is slurred.     Labs reviewed: Nursing Home on 08/17/2015  Component Date Value Ref Range Status  . Hemoglobin 07/24/2015 9.9* 13.5 - 17.5 g/dL Final  . HCT 11/91/4782 32* 41 - 53 % Final  . Platelets 07/24/2015 203  150 - 399 K/L Final  . WBC 07/24/2015 8.6   Final  Nursing Home on 07/22/2015  Component Date Value Ref Range Status  . HM Diabetic Foot Exam 08/18/2014 completed   Final  . HM Diabetic Eye Exam 05/13/2014 No Retinopathy  No Retinopathy Final  Nursing Home on 06/25/2015  Component Date Value Ref Range Status  . Hemoglobin 05/22/2015 9.3* 13.5 - 17.5 g/dL Final  . HCT 95/62/1308 29* 41 - 53 % Final  . Platelets 05/22/2015  193  150 - 399 K/L Final  . WBC 05/22/2015 5.7   Final  . Glucose 05/22/2015 120   Final  . BUN 05/22/2015 20  4 - 21 mg/dL Final  . Creatinine 65/78/4696 0.7  0.6 - 1.3 mg/dL Final  . Potassium 29/52/8413 3.7  3.4 - 5.3 mmol/L Final  . Sodium 05/22/2015 141  137 - 147 mmol/L Final    No results found.   Assessment/Plan   ICD-9-CM ICD-10-CM   1. FTT (failure to thrive) in adult - worsening 783.7 R62.7   2. Dementia with behavioral disturbance 294.21 F03.91   3. Anemia, unspecified anemia type 285.9 D64.9   4. Essential hypertension 401.9 I10   5. Chronic systolic CHF (congestive heart failure) (HCC) 428.22 I50.22    428.0    6. BPH (benign prostatic hyperplasia) 600.00 N40.0   7. Gastroesophageal reflux disease without esophagitis 530.81 K21.9   8. Controlled type 2 diabetes mellitus without complication, without long-term current use of insulin (HCC) 250.00 E11.9      Check CMP  Cont current meds as ordered  Fall/aspiration precautions  Cony nutritional supplements  PT/OT/ST as indicated  Cont prn Lucas O2 and mask O2 to keep O2 sats >90%  Will follow  Eutimio Gharibian S. Ancil Linsey  [facility] [address] [place], [place] [address] [phone] Cell (Monday-Friday 8 AM - 5 PM) [phone] After 5 PM and follow prompts

## 2015-08-19 LAB — BASIC METABOLIC PANEL
BUN: 18 mg/dL (ref 4–21)
Creatinine: 0.7 mg/dL (ref 0.6–1.3)
Glucose: 96 mg/dL
POTASSIUM: 4.4 mmol/L (ref 3.4–5.3)
SODIUM: 140 mmol/L (ref 137–147)

## 2015-08-19 LAB — HEPATIC FUNCTION PANEL
ALK PHOS: 44 U/L (ref 25–125)
ALT: 11 U/L (ref 10–40)
AST: 14 U/L (ref 14–40)
BILIRUBIN, TOTAL: 0.7 mg/dL

## 2015-09-02 LAB — HM DIABETES EYE EXAM

## 2015-09-17 ENCOUNTER — Encounter: Payer: Self-pay | Admitting: Internal Medicine

## 2015-09-17 ENCOUNTER — Non-Acute Institutional Stay (SKILLED_NURSING_FACILITY): Payer: Medicare Other | Admitting: Internal Medicine

## 2015-09-17 DIAGNOSIS — I5022 Chronic systolic (congestive) heart failure: Secondary | ICD-10-CM

## 2015-09-17 DIAGNOSIS — F0391 Unspecified dementia with behavioral disturbance: Secondary | ICD-10-CM | POA: Diagnosis not present

## 2015-09-17 DIAGNOSIS — R062 Wheezing: Secondary | ICD-10-CM | POA: Diagnosis not present

## 2015-09-17 DIAGNOSIS — F03918 Unspecified dementia, unspecified severity, with other behavioral disturbance: Secondary | ICD-10-CM

## 2015-09-17 DIAGNOSIS — R05 Cough: Secondary | ICD-10-CM | POA: Diagnosis not present

## 2015-09-17 DIAGNOSIS — R627 Adult failure to thrive: Secondary | ICD-10-CM | POA: Diagnosis not present

## 2015-09-17 DIAGNOSIS — N182 Chronic kidney disease, stage 2 (mild): Secondary | ICD-10-CM | POA: Diagnosis not present

## 2015-09-17 DIAGNOSIS — K219 Gastro-esophageal reflux disease without esophagitis: Secondary | ICD-10-CM

## 2015-09-17 DIAGNOSIS — D649 Anemia, unspecified: Secondary | ICD-10-CM

## 2015-09-17 DIAGNOSIS — R1310 Dysphagia, unspecified: Secondary | ICD-10-CM

## 2015-09-17 DIAGNOSIS — I10 Essential (primary) hypertension: Secondary | ICD-10-CM

## 2015-09-17 DIAGNOSIS — E1122 Type 2 diabetes mellitus with diabetic chronic kidney disease: Secondary | ICD-10-CM | POA: Diagnosis not present

## 2015-09-17 DIAGNOSIS — R059 Cough, unspecified: Secondary | ICD-10-CM

## 2015-09-17 NOTE — Progress Notes (Signed)
DATE: 09/17/15  Location:  [facility]  Nursing Home Room Number: 204 B Place of Service: SNF (31)   Extended Emergency Contact Information Primary Emergency Contact: Ryle,[name] Address: [address]          [place], [place] [address] [place] Home Phone: [phone] Relation: Son Secondary Emergency Contact: Nicholas LoseMandrano,Mark Address: [address]           [place], [place] [id number] [place] Home Phone: [phone] Relation: Son  Advanced Directive information Does patient have an advance directive?: Yes, Type of Advance Directive: Out of facility DNR (pink MOST or yellow form), Does patient want to make changes to advanced directive?: No - Patient declined  Chief Complaint  Patient presents with  . Medical Management of Chronic Issues    Routine Visit    HPI:  82 yo male long term resident seen today for f/u. He c/o cough whenever he eats. No new SOB. No nursing issues. No falls. He is a poor historian due to dementia. Hx obtained from chart.   Hypertension - BP controlled on lisinopril 2.5 mg daily and coreg 3.125 mg twice daily. He takes ASA 81 mg daily   Anemia - chronic. Hgb 9.3. He takes iron twice daily and B12 tabs daily  BPH - stable urinary sx's on proscar 5 mg daily and flomax 0.4 mg daily   Dysphagia - stable with no signs of aspiration at present. He is on nectar thick liquids.   GERD/constipation - stable on prilosec 20 mg daily. He takes  senna s 2 tabs daily and dulcolax supp daily to keep BMs regular  Allergic rhinitis - stable on flonase daily and claritin daily    Chronic systolic heart failure - stable on coreg 3.125 mg twice daily. Currently not on diuretic  Dementia/FTT - likely end stage.  Currently not on medications. Weight is up to 158 lbs (1 lb gain since last month). He receives supplements per facility protocol and taking remeron 15 mg nightly   Past Medical History  Diagnosis Date  .  GERD (gastroesophageal reflux disease)   . Hypertension   . BPH (benign prostatic hyperplasia)   . Diabetes mellitus without complication (HCC)   . Constipation     Past Surgical History  Procedure Laterality Date  . Hip surgery    . Hernia repair      Patient Care Team: Margit HanksAnne D Alexander, MD as Consulting Physician (Internal Medicine)  Social History   Social History  . Marital Status: Divorced    Spouse Name: N/A  . Number of Children: N/A  . Years of Education: N/A   Occupational History  . Not on file.   Social History Main Topics  . Smoking status: Former Games developermoker  . Smokeless tobacco: Never Used  . Alcohol Use: No     Comment: occasionally has not had for years.  . Drug Use: No  . Sexual Activity: Not on file   Other Topics Concern  . Not on file   Social History Narrative     reports that he has quit smoking. He has never used smokeless tobacco. He reports that he does not drink alcohol or use illicit drugs.  Family History  Problem Relation Age of Onset  . Diabetes Mellitus II Mother    No family status information on file.    Immunization History  Administered Date(s) Administered  . Influenza-Unspecified 12/31/2013, 01/06/2015  . PPD Test 05/15/2013  Allergies  Allergen Reactions  . Ativan [Lorazepam] Other (See Comments)    unknown    Medications: Patient's Medications  New Prescriptions   No medications on file  Previous Medications   ACETAMINOPHEN (TYLENOL) 500 MG TABLET    Take 1,000 mg by mouth every 6 (six) hours as needed for mild pain.    AMBULATORY NON FORMULARY MEDICATION    Magic cup three times daily   ASPIRIN 81 MG TABLET    Take 81 mg by mouth daily.   BETA CAROTENE W/MINERALS (OCUVITE) TABLET    Take 1 tablet by mouth daily. For vision   BISACODYL (DULCOLAX) 10 MG SUPPOSITORY    Place 10 mg rectally daily. If constipation is not relieved by by MOM give 10 mg Bisacodyl suppository rectally x 1 dose in 24 hours as needed    CARVEDILOL (COREG) 3.125 MG TABLET    Take 3.125 mg by mouth 2 (two) times daily with a meal.   CLOTRIMAZOLE (LOTRIMIN) 1 % CREAM    Apply 1 application topically 2 (two) times daily.   EMOLLIENT (GOLD BOND ULTIMATE) LOTN    Apply topically. Apply to back and chest daily   FERROUS SULFATE 325 (65 FE) MG TABLET    Take 325 mg by mouth 2 (two) times daily with a meal.   FINASTERIDE (PROSCAR) 5 MG TABLET    Take 5 mg by mouth daily.   FLUOCINONIDE CREAM (LIDEX) 0.05 %    Apply 1 application topically 2 (two) times daily. Monday through Thursday to legs   FLUTICASONE (FLONASE) 50 MCG/ACT NASAL SPRAY    Place 1 spray into both nostrils daily.   HYDROXYZINE (ATARAX/VISTARIL) 25 MG TABLET    Take 25 mg by mouth every 6 (six) hours as needed for itching.   LISINOPRIL (PRINIVIL,ZESTRIL) 5 MG TABLET    1/2 tablet by mouth daily   LORATADINE (CLARITIN) 10 MG TABLET    Take 10 mg by mouth daily. For Allergies   MIRTAZAPINE (REMERON) 15 MG TABLET    Take 15 mg by mouth at bedtime.   NYSTATIN (MYCOSTATIN/NYSTOP) 100000 UNIT/GM POWD    Apply to groin and peri area as needed.   OMEPRAZOLE (PRILOSEC) 20 MG CAPSULE    Take 10 mg by mouth every morning. On an empty stomach   OXYGEN    O2 @@ 2 liters per nasal cannula as needed to maintain O2 stats at 90% or above   OXYGEN    Inhale into the lungs. O2 at 4 liters per mask as neededto maintain O2 stats 90% or greater   POLYVINYL ALCOHOL (LIQUIFILM TEARS) 1.4 % OPHTHALMIC SOLUTION    Place 1 drop into both eyes daily. For dry eyes   TAMSULOSIN (FLOMAX) 0.4 MG CAPS CAPSULE    Take 0.4 mg by mouth daily after supper.    VITAMIN B-12 (CYANOCOBALAMIN) 1000 MCG TABLET    Take 1,000 mcg by mouth daily. For supplement  Modified Medications   No medications on file  Discontinued Medications   LOPERAMIDE (IMODIUM) 2 MG CAPSULE    Reported on 09/17/2015   PROMETHAZINE (PHENERGAN) 25 MG SUPPOSITORY    Place 25 mg rectally every 6 (six) hours as needed for nausea or vomiting  (Notify MD if symptoms persist for more than 24 hours). Reported on 09/17/2015   PROMETHAZINE (PHENERGAN) 25 MG TABLET    Take 25 mg by mouth every 6 (six) hours as needed for nausea or vomiting. Reported on 09/17/2015    Review of Systems  Unable to perform ROS: Dementia    Filed Vitals:   09/17/15 0838  BP: 134/79  Pulse: 79  Temp: 97.8 F (36.6 C)  TempSrc: Oral  Resp: 16  Height:  (1.626 m)  Weight: 158 lb 9.6 oz (71.94 kg)   Body mass index is 27.21 kg/(m^2).  Physical Exam  Constitutional: He appears well-developed.  Frail appearing in NAD, sitting in w/c  HENT:  Mouth/Throat: Oropharynx is clear and moist.  Eyes: Pupils are equal, round, and reactive to light. No scleral icterus.  Neck: Neck supple. Carotid bruit is not present.  Flexion contracture  Cardiovascular: Normal rate, regular rhythm and intact distal pulses.  Exam reveals no gallop and no friction rub.   Murmur (1/6 SEM) heard. Trace LE edema b/l. No calf TTP  Pulmonary/Chest: Effort normal. No respiratory distress. He has wheezes (end expiratory b/l). He has no rales. He exhibits no tenderness.  Abdominal: Soft. Bowel sounds are normal. He exhibits no distension, no abdominal bruit, no pulsatile midline mass and no mass. There is no tenderness. There is no rebound and no guarding.  Musculoskeletal: He exhibits edema.  Lymphadenopathy:    He has no cervical adenopathy.  Neurological: He is alert.  Skin: Skin is warm and dry. No rash noted.  Psychiatric: He has a normal mood and affect. His behavior is normal. His speech is slurred.     Labs reviewed: Nursing Home on 09/17/2015  Component Date Value Ref Range Status  . Glucose 08/19/2015 96   Final  . BUN 08/19/2015 18  4 - 21 mg/dL Final  . Creatinine 27/25/3664 0.7  0.6 - 1.3 mg/dL Final  . Potassium 40/34/7425 4.4  3.4 - 5.3 mmol/L Final  . Sodium 08/19/2015 140  137 - 147 mmol/L Final  . Alkaline Phosphatase 08/19/2015 44  25 - 125 U/L Final    . ALT 08/19/2015 11  10 - 40 U/L Final  . AST 08/19/2015 14  14 - 40 U/L Final  . Bilirubin, Total 08/19/2015 0.7   Final  Abstract on 09/16/2015  Component Date Value Ref Range Status  . HM Diabetic Eye Exam 09/02/2015 No Retinopathy  No Retinopathy Final   [facility]  Nursing Home on 08/17/2015  Component Date Value Ref Range Status  . Hemoglobin 07/24/2015 9.9* 13.5 - 17.5 g/dL Final  . HCT 95/63/8756 32* 41 - 53 % Final  . Platelets 07/24/2015 203  150 - 399 K/L Final  . WBC 07/24/2015 8.6   Final  Nursing Home on 07/22/2015  Component Date Value Ref Range Status  . HM Diabetic Foot Exam 08/18/2014 completed   Final  . HM Diabetic Eye Exam 05/13/2014 No Retinopathy  No Retinopathy Final  Nursing Home on 06/25/2015  Component Date Value Ref Range Status  . Hemoglobin 05/22/2015 9.3* 13.5 - 17.5 g/dL Final  . HCT 43/32/9518 29* 41 - 53 % Final  . Platelets 05/22/2015 193  150 - 399 K/L Final  . WBC 05/22/2015 5.7   Final  . Glucose 05/22/2015 120   Final  . BUN 05/22/2015 20  4 - 21 mg/dL Final  . Creatinine 84/16/6063 0.7  0.6 - 1.3 mg/dL Final  . Potassium 01/60/1093 3.7  3.4 - 5.3 mmol/L Final  . Sodium 05/22/2015 141  137 - 147 mmol/L Final    No results found.   Assessment/Plan   ICD-9-CM ICD-10-CM   1. Cough r/o pneumonia vs CHF 786.2 R05   2. Wheezing 786.07 R06.2  3. Chronic systolic CHF (congestive heart failure) (HCC) 428.22 I50.22    428.0    4. Essential hypertension 401.9 I10   5. Controlled type 2 diabetes mellitus with stage 2 chronic kidney disease, without long-term current use of insulin (HCC) 250.40 E11.22    585.2 N18.2   6. Dysphagia 787.20 R13.10   7. FTT (failure to thrive) in adult 783.7 R62.7   8. Gastroesophageal reflux disease without esophagitis 530.81 K21.9   9. CKD (chronic kidney disease) stage 2, GFR 60-89 ml/min 585.2 N18.2   10. Anemia, unspecified anemia type 285.9 D64.9   11. Dementia with behavioral  disturbance 294.21 F03.91     Check CXR stat to r/o pneumonia vs HF  duoneb q6hr prn SOB, wheezing, cough -1st dose now  Cont other meds as ordered  PT/OT/ST as indicated  Aspiration precautions  Nutritional supplements as ordered  Will follow  Catelynn Sparger S. Ancil Linsey  [facility] [address] [place], [place] [address] [phone] Cell (Monday-Friday 8 AM - 5 PM) [phone] After 5 PM and follow prompts

## 2015-10-05 LAB — CBC AND DIFFERENTIAL
HCT: 33 % — AB (ref 41–53)
HEMOGLOBIN: 10.3 g/dL — AB (ref 13.5–17.5)
Platelets: 196 10*3/uL (ref 150–399)
WBC: 6.2 10^3/mL

## 2015-10-05 LAB — BASIC METABOLIC PANEL
BUN: 23 mg/dL — AB (ref 4–21)
Creatinine: 0.8 mg/dL (ref 0.6–1.3)
Glucose: 124 mg/dL
POTASSIUM: 3.7 mmol/L (ref 3.4–5.3)
SODIUM: 145 mmol/L (ref 137–147)

## 2015-10-26 ENCOUNTER — Non-Acute Institutional Stay (SKILLED_NURSING_FACILITY): Payer: Medicare Other | Admitting: Internal Medicine

## 2015-10-26 ENCOUNTER — Encounter: Payer: Self-pay | Admitting: Internal Medicine

## 2015-10-26 DIAGNOSIS — F0391 Unspecified dementia with behavioral disturbance: Secondary | ICD-10-CM | POA: Diagnosis not present

## 2015-10-26 DIAGNOSIS — I10 Essential (primary) hypertension: Secondary | ICD-10-CM | POA: Diagnosis not present

## 2015-10-26 DIAGNOSIS — E1122 Type 2 diabetes mellitus with diabetic chronic kidney disease: Secondary | ICD-10-CM

## 2015-10-26 DIAGNOSIS — R627 Adult failure to thrive: Secondary | ICD-10-CM | POA: Diagnosis not present

## 2015-10-26 DIAGNOSIS — I5022 Chronic systolic (congestive) heart failure: Secondary | ICD-10-CM | POA: Diagnosis not present

## 2015-10-26 DIAGNOSIS — N182 Chronic kidney disease, stage 2 (mild): Secondary | ICD-10-CM | POA: Diagnosis not present

## 2015-10-26 DIAGNOSIS — F03918 Unspecified dementia, unspecified severity, with other behavioral disturbance: Secondary | ICD-10-CM

## 2015-10-26 NOTE — Progress Notes (Signed)
Patient ID: Riley Martinez, male   DOB: 1915/01/02, 82 y.o.   MRN: [id number]     DATE: 10/26/15  Location:  Nursing Home Location: [facility]  Nursing Home Room Number: 204 B Place of Service: SNF (31)   Extended Emergency Contact Information Primary Emergency Contact: Marbach,[name] Address: [address]          [place], [place] [address] [place] Home Phone: [phone] Relation: Son Secondary Emergency Contact: Nicholas Lose Address: [address]           [place], [place] [address] [place] Home Phone: [phone] Relation: Son  Advanced Directive information Does patient have an advance directive?: Yes, Type of Advance Directive: Out of facility DNR (pink MOST or yellow form), Does patient want to make changes to advanced directive?: No - Patient declined  Chief Complaint  Patient presents with  . Medical Management of Chronic Issues    Routine Visit    HPI:  82 yo male long term resident seen today for f/u. Cough improved since last visit. No nursing issues. No falls. Appetite ok. Sleeping ok. He is a poor historian due to dementia. Hx obtained from chart  Hypertension - BP controlled on lisinopril 2.5 mg daily and coreg 3.125 mg twice daily. He takes ASA 81 mg daily   Anemia - chronic. Hgb 9.3. He takes iron twice daily and B12 tabs daily  BPH - stable urinary sx's on proscar 5 mg daily and flomax 0.4 mg daily   Dysphagia - stable with no signs of aspiration at present. He is on nectar thick liquids.   GERD/constipation - stable on prilosec 20 mg daily. He takes  senna s 2 tabs daily and dulcolax supp daily to keep BMs regular  Allergic rhinitis - stable on flonase daily and claritin daily    Chronic systolic heart failure - stable on coreg 3.125 mg twice daily. Currently not on diuretic  Dementia/FTT - likely end stage.  Currently not on medications. Weight is down to 155 lbs since last month (lost 3 lbs). He receives  supplements per facility protocol and taking remeron 15 mg nightly  DM - diet controlled   Past Medical History:  Diagnosis Date  . BPH (benign prostatic hyperplasia)   . Constipation   . Diabetes mellitus without complication (HCC)   . GERD (gastroesophageal reflux disease)   . Hypertension     Past Surgical History:  Procedure Laterality Date  . HERNIA REPAIR    . HIP SURGERY      Patient Care Team: Margit Hanks, MD as Consulting Physician (Internal Medicine)  Social History   Social History  . Marital status: Divorced    Spouse name: N/A  . Number of children: N/A  . Years of education: N/A   Occupational History  . Not on file.   Social History Main Topics  . Smoking status: Former Games developer  . Smokeless tobacco: Never Used  . Alcohol use No     Comment: occasionally has not had for years.  . Drug use: No  . Sexual activity: Not on file   Other Topics Concern  . Not on file   Social History Narrative  . No narrative on file     reports that he has quit smoking. He has never used smokeless tobacco. He reports that he does not drink alcohol or use drugs.  Family History  Problem Relation Age of Onset  . Diabetes Mellitus II Mother  Family Status  Relation Status  . Mother     Immunization History  Administered Date(s) Administered  . Influenza-Unspecified 12/31/2013, 01/06/2015  . PPD Test 05/15/2013    Allergies  Allergen Reactions  . Ativan [Lorazepam] Other (See Comments)    unknown    Medications: Patient's Medications  New Prescriptions   No medications on file  Previous Medications   ACETAMINOPHEN (TYLENOL) 500 MG TABLET    Take 1,000 mg by mouth every 6 (six) hours as needed for mild pain.    AMBULATORY NON FORMULARY MEDICATION    Magic cup three times daily   ASPIRIN 81 MG TABLET    Take 81 mg by mouth daily.   BETA CAROTENE W/MINERALS (OCUVITE) TABLET    Take 1 tablet by mouth daily. For vision   BISACODYL (DULCOLAX) 10 MG  SUPPOSITORY    Place 10 mg rectally daily. If constipation is not relieved by by MOM give 10 mg Bisacodyl suppository rectally x 1 dose in 24 hours as needed   CARVEDILOL (COREG) 3.125 MG TABLET    Take 3.125 mg by mouth 2 (two) times daily with a meal.   CLOTRIMAZOLE (LOTRIMIN) 1 % CREAM    Apply 1 application topically 2 (two) times daily.   EMOLLIENT (GOLD BOND ULTIMATE) LOTN    Apply topically. Apply to back and chest daily   FERROUS SULFATE 325 (65 FE) MG TABLET    Take 325 mg by mouth 2 (two) times daily with a meal.   FINASTERIDE (PROSCAR) 5 MG TABLET    Take 5 mg by mouth daily.   FLUOCINONIDE CREAM (LIDEX) 0.05 %    Apply 1 application topically 2 (two) times daily. Monday through Thursday to legs   FLUTICASONE (FLONASE) 50 MCG/ACT NASAL SPRAY    Place 1 spray into both nostrils daily.   HYDROXYZINE (ATARAX/VISTARIL) 25 MG TABLET    Take 25 mg by mouth every 6 (six) hours as needed for itching.   IPRATROPIUM-ALBUTEROL (DUONEB) 0.5-2.5 (3) MG/3ML SOLN    Take 3 mLs by nebulization every 6 (six) hours as needed (as needed for cough).   LISINOPRIL (PRINIVIL,ZESTRIL) 5 MG TABLET    1/2 tablet by mouth daily   LORATADINE (CLARITIN) 10 MG TABLET    Take 10 mg by mouth daily. For Allergies   MIRTAZAPINE (REMERON) 15 MG TABLET    Take 15 mg by mouth at bedtime.   NON FORMULARY    Med Pass 120 ml by mouth daily for supplement   NYSTATIN (MYCOSTATIN/NYSTOP) 100000 UNIT/GM POWD    Apply to groin and peri area as needed.   OMEPRAZOLE (PRILOSEC) 20 MG CAPSULE    Take 10 mg by mouth every morning. On an empty stomach   OXYGEN    O2 @@ 2 liters per nasal cannula as needed to maintain O2 stats at 90% or above   OXYGEN    Inhale into the lungs. O2 at 4 liters per mask as neededto maintain O2 stats 90% or greater   POLYVINYL ALCOHOL (LIQUIFILM TEARS) 1.4 % OPHTHALMIC SOLUTION    Place 1 drop into both eyes daily. For dry eyes   TAMSULOSIN (FLOMAX) 0.4 MG CAPS CAPSULE    Take 0.4 mg by mouth daily after  supper.    VITAMIN B-12 (CYANOCOBALAMIN) 1000 MCG TABLET    Take 1,000 mcg by mouth daily. For supplement  Modified Medications   No medications on file  Discontinued Medications   No medications on file    Review of  Systems  Unable to perform ROS: Dementia    Vitals:   10/26/15 0923  BP: 127/64  Pulse: 67  Resp: (!) 24  Temp: 97.6 F (36.4 C)  TempSrc: Oral  Weight: 155 lb 12.8 oz (70.7 kg)  Height: 5\' 4"  (1.626 m)   Body mass index is 26.74 kg/m.  Physical Exam  Constitutional: He appears well-developed.  Frail appearing in NAD, sitting in w/c  HENT:  Mouth/Throat: Oropharynx is clear and moist. No oropharyngeal exudate.  Eyes: Pupils are equal, round, and reactive to light. No scleral icterus.  Neck: Neck supple. Carotid bruit is not present.  Flexion contracture with b/l muscle hypertrophy  Cardiovascular: Normal rate, regular rhythm and intact distal pulses.  Exam reveals no gallop and no friction rub.   Murmur (1/6 SEM) heard. Trace LE edema b/l. No calf TTP  Pulmonary/Chest: Effort normal. No respiratory distress. He has wheezes (end expiratory  and rare). He has no rales. He exhibits no tenderness.  reduced BS at base b/l  Abdominal: Soft. Bowel sounds are normal. He exhibits no distension, no abdominal bruit, no pulsatile midline mass and no mass. There is no tenderness. There is no rebound and no guarding.  Musculoskeletal: He exhibits edema.  Lymphadenopathy:    He has no cervical adenopathy.  Neurological: He is alert.  Skin: Skin is warm and dry. No rash noted.  Psychiatric: He has a normal mood and affect. His behavior is normal. His speech is slurred.     Labs reviewed: Nursing Home on 09/17/2015  Component Date Value Ref Range Status  . Glucose 08/19/2015 96  mg/dL Final  . BUN 38/75/6433 18  4 - 21 mg/dL Final  . Creatinine 29/51/8841 0.7  0.6 - 1.3 mg/dL Final  . Potassium 66/08/3014 4.4  3.4 - 5.3 mmol/L Final  . Sodium 08/19/2015 140  137 -  147 mmol/L Final  . Alkaline Phosphatase 08/19/2015 44  25 - 125 U/L Final  . ALT 08/19/2015 11  10 - 40 U/L Final  . AST 08/19/2015 14  14 - 40 U/L Final  . Bilirubin, Total 08/19/2015 0.7  mg/dL Final  Abstract on 04/05/3233  Component Date Value Ref Range Status  . HM Diabetic Eye Exam 09/02/2015 No Retinopathy  No Retinopathy Final  Nursing Home on 08/17/2015  Component Date Value Ref Range Status  . Hemoglobin 07/24/2015 9.9* 13.5 - 17.5 g/dL Final  . HCT 57/32/2025 32* 41 - 53 % Final  . Platelets 07/24/2015 203  150 - 399 K/L Final  . WBC 07/24/2015 8.6  10^3/mL Final    No results found.   Assessment/Plan   ICD-9-CM ICD-10-CM   1. Chronic systolic CHF (congestive heart failure) (HCC) 428.22 I50.22    428.0    2. Controlled type 2 diabetes mellitus with stage 2 chronic kidney disease, without long-term current use of insulin (HCC) 250.40 E11.22    585.2 N18.2   3. Essential hypertension 401.9 I10   4. FTT (failure to thrive) in adult 783.7 R62.7   5. Dementia with behavioral disturbance 294.21 F03.91      Cont current meds as ordered  PT/OT/ST as indicated  Aspiration precautions  Nutritional supplements as ordered  Will follow  Azari Janssens S. Ancil Linsey  [facility] [address] [place], [place] [address] [phone] Cell (Monday-Friday 8 AM - 5 PM) [phone] After 5 PM and follow prompts

## 2015-10-30 LAB — CBC AND DIFFERENTIAL
HEMATOCRIT: 31 % — AB (ref 41–53)
HEMOGLOBIN: 9.6 g/dL — AB (ref 13.5–17.5)
Neutrophils Absolute: 3660 /uL
Platelets: 129 10*3/uL — AB (ref 150–399)
WBC: 6 10^3/mL

## 2015-10-31 LAB — BASIC METABOLIC PANEL
BUN: 24 mg/dL — AB (ref 4–21)
CREATININE: 0.6 mg/dL (ref 0.6–1.3)
GLUCOSE: 105 mg/dL
Potassium: 3.8 mmol/L (ref 3.4–5.3)
Sodium: 144 mmol/L (ref 137–147)

## 2015-10-31 LAB — CBC AND DIFFERENTIAL
HCT: 29 % — AB (ref 41–53)
Hemoglobin: 9 g/dL — AB (ref 13.5–17.5)
NEUTROS ABS: 2623 /uL
PLATELETS: 148 10*3/uL — AB (ref 150–399)
WBC: 4.3 10*3/mL

## 2015-11-05 ENCOUNTER — Non-Acute Institutional Stay (SKILLED_NURSING_FACILITY): Payer: Medicare Other | Admitting: Internal Medicine

## 2015-11-05 ENCOUNTER — Encounter: Payer: Self-pay | Admitting: Internal Medicine

## 2015-11-05 DIAGNOSIS — K219 Gastro-esophageal reflux disease without esophagitis: Secondary | ICD-10-CM | POA: Diagnosis not present

## 2015-11-05 DIAGNOSIS — D649 Anemia, unspecified: Secondary | ICD-10-CM

## 2015-11-05 DIAGNOSIS — N182 Chronic kidney disease, stage 2 (mild): Secondary | ICD-10-CM | POA: Diagnosis not present

## 2015-11-05 DIAGNOSIS — R1310 Dysphagia, unspecified: Secondary | ICD-10-CM | POA: Diagnosis not present

## 2015-11-05 MED ORDER — OMEPRAZOLE 20 MG PO CPDR: 20.0000 mg | DELAYED_RELEASE_CAPSULE | Freq: Every day | ORAL | Status: DC

## 2015-11-05 NOTE — Assessment & Plan Note (Signed)
Dietary interventions will be verified to prevent aspiration

## 2015-11-05 NOTE — Patient Instructions (Addendum)
Prilosec will be increased to 20 mg daily. Dysphagia precautions clinically indicated. Anemia is variable but essentially stable; simply monitor for any signs of bleeding especially dark, tarry stool or rectal bleeding.

## 2015-11-05 NOTE — Assessment & Plan Note (Signed)
Increase generic Prilosec to 20 mg daily

## 2015-11-05 NOTE — Progress Notes (Signed)
    Facility Location: [facility]  Room Number: 204-B   Code Status: DNR  This is a nursing facility follow up for specific acute issue of anemia unspecified. Interim medical record and care since last [facility] visit was updated with review of diagnostic studies and change in clinical status since last visit were documented.  HPI: I was asked to review paper lab reports 8/3 and 10/30/15 which demonstrated a progressive anemia. The hemoglobin on 8/4 was 9.6 and hematocrit 30.5. On 8/5 these values were 9 and 28.7 respectively. He has history of GERD and is on 10 mg of Prilosec daily. He is also on vitamin B12 1000 milligrams daily orally and iron 325 mg orally daily. By history the GERD has been associated with dysphagia.  Reviewing the Epic chart revealed that his anemia has been somewhat variable but essentially stable 2/24-5/24. On 4/28 hemoglobin was 9.9 and hematocrit 32 and 9.3 and 29 on 2/24..  No B12 , ferritin or iron levels in Epic. Problem list includes chronic kidney disease stage II. Epistaxis, hemoptysis, hematuria, melena, or rectal bleeding denied. His CMA stated that he has intermittent loose to watery stools but no other bowel changes.  No unexplained weight loss, significant dyspepsia,dysphagia, or abdominal pain.  There is no abnormal bleeding or difficulty stopping bleeding with injury.He believes he may have some easy bruising. He also admitted to some intermittent dysuria. Actually his level of dementia invalidated responses. He was unable to give the month or year or the name of the president. 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 He is markedly hard of hearing. He has ptosis of the left eye. Dentition is very poor with multiple  missing teeth and also caries. He has very mild rhonchi bilaterally. Heart rhythm and rate are irregular. Pedal pulses are decreased. He has scattered ecchymoses and excoriations over the forearms. When he attempted to drink fluids he did have a paroxysm of coughing. There is hyperpigmentation over the right anterior shin.   General appearance:no acute distress , increased work of breathing is present.   Lymphatic: No lymphadenopathy about the head, neck, axilla . Eyes: No conjunctival inflammation or lid edema is present. There is no scleral icterus. Ears:  External ear exam shows no significant lesions or deformities.   Nose:  External nasal examination shows no deformity or inflammation. Nasal mucosa are pink and moist without lesions ,exudates Oral exam: lips and gums are healthy appearing.There is no oropharyngeal erythema or exudate . Neck:  No thyromegaly, masses, tenderness noted.    Heart:  No gallop, murmur, click, rub .  Abdomen:Bowel sounds are normal. Abdomen is soft and nontender with no organomegaly, hernias,masses. GU: deferred  Extremities:  No cyanosis, clubbing,edema  Neurologic exam : Strength decreased  in upper & lower extremities Balance,Rhomberg,finger to nose testing could not be completed due to clinical state Deep tendon reflexes are equal Skin: Warm & dry w/o tenting. No significant lesions or rash.    See summary under each active problem in the Problem List with associated updated therapeutic plan

## 2015-11-05 NOTE — Assessment & Plan Note (Addendum)
After review of the paper reports as well as the data in Epic, the anemia appears chronic and slightly variable. No bleeding dyscrasias identified. Aggressive intervention not indicated clinically due to co-morbidities and age.

## 2015-11-05 NOTE — Assessment & Plan Note (Signed)
10/31/15 creatinine creatinine 0.6 and BUN 24

## 2015-11-25 ENCOUNTER — Encounter: Payer: Self-pay | Admitting: Nurse Practitioner

## 2015-11-25 ENCOUNTER — Non-Acute Institutional Stay (SKILLED_NURSING_FACILITY): Payer: Medicare Other | Admitting: Nurse Practitioner

## 2015-11-25 DIAGNOSIS — N400 Enlarged prostate without lower urinary tract symptoms: Secondary | ICD-10-CM | POA: Diagnosis not present

## 2015-11-25 DIAGNOSIS — F0391 Unspecified dementia with behavioral disturbance: Secondary | ICD-10-CM

## 2015-11-25 DIAGNOSIS — I110 Hypertensive heart disease with heart failure: Secondary | ICD-10-CM | POA: Diagnosis not present

## 2015-11-25 DIAGNOSIS — F03918 Unspecified dementia, unspecified severity, with other behavioral disturbance: Secondary | ICD-10-CM

## 2015-11-25 DIAGNOSIS — R627 Adult failure to thrive: Secondary | ICD-10-CM | POA: Diagnosis not present

## 2015-11-25 DIAGNOSIS — I5022 Chronic systolic (congestive) heart failure: Secondary | ICD-10-CM

## 2015-11-25 NOTE — Progress Notes (Signed)
Patient ID: Riley Martinez, male   DOB: Apr 10, 1914, 82 y.o.   MRN: [id number]    Nursing Home Location:  [facility]  Place of Service: SNF (31)  PCP: Marga Melnick, MD  Allergies  Allergen Reactions  . Ativan [Lorazepam] Other (See Comments)    unknown    Chief Complaint  Patient presents with  . Medical Management of Chronic Issues    Routine Visit    HPI:  Patient is a 82 y.o. male seen today at [facility] for routine follow up on chronic conditions. Pt with a PMH of dementia, HTN, BPH (s/p TURP) GERD, CHF. Pt has been doing well in the last month. Recent labs with anemia however overall stable. Recently with increase in Prilosec due to dysphagia. Staff without acute issues and pt has no complaints today.    Review of Systems:  Review of Systems  Constitutional: Negative for activity change, appetite change, fatigue and unexpected weight change.  HENT: Negative for congestion, hearing loss and rhinorrhea.   Eyes: Negative.   Respiratory: Negative for cough, shortness of breath and wheezing.   Cardiovascular: Negative for chest pain, palpitations and leg swelling.  Gastrointestinal: Negative for abdominal pain, constipation and diarrhea.  Genitourinary: Negative for difficulty urinating, dysuria and hematuria. Frequency: reports no changes in frequency.  Musculoskeletal: Negative for arthralgias and myalgias.  Skin: Negative for color change and wound.  Neurological: Negative for dizziness and weakness.  Psychiatric/Behavioral: Positive for confusion (poor memory). Negative for agitation, behavioral problems and sleep disturbance.    Past Medical History:  Diagnosis Date  . BPH (benign prostatic hyperplasia)   . Constipation   . Diabetes mellitus without complication (HCC)   . GERD (gastroesophageal reflux disease)   . Hypertension    Past Surgical History:  Procedure Laterality Date  . HERNIA REPAIR    . HIP SURGERY     Social History:   reports  that he has quit smoking. He has never used smokeless tobacco. He reports that he does not drink alcohol or use drugs.  Family History  Problem Relation Age of Onset  . Diabetes Mellitus II Mother     Medications: Patient's Medications  New Prescriptions   No medications on file  Previous Medications   ACETAMINOPHEN (TYLENOL) 500 MG TABLET    Take 1,000 mg by mouth every 6 (six) hours as needed for mild pain.    AMBULATORY NON FORMULARY MEDICATION    Magic cup three times daily   ASPIRIN 81 MG TABLET    Take 81 mg by mouth daily.   BETA CAROTENE W/MINERALS (OCUVITE) TABLET    Take 1 tablet by mouth daily. For vision   BISACODYL (DULCOLAX) 10 MG SUPPOSITORY    Place 10 mg rectally daily. If constipation is not relieved by by MOM give 10 mg Bisacodyl suppository rectally x 1 dose in 24 hours as needed   CARVEDILOL (COREG) 3.125 MG TABLET    Take 3.125 mg by mouth 2 (two) times daily with a meal.   CLOTRIMAZOLE (LOTRIMIN) 1 % CREAM    Apply 1 application topically 2 (two) times daily.   EMOLLIENT (GOLD BOND ULTIMATE) LOTN    Apply topically. Apply to back and chest daily   FERROUS SULFATE 325 (65 FE) MG TABLET    Take 325 mg by mouth 2 (two) times daily with a meal.   FINASTERIDE (PROSCAR) 5 MG TABLET    Take 5 mg by mouth daily.   FLUOCINONIDE CREAM (LIDEX) 0.05 %  Apply 1 application topically 2 (two) times daily. Monday through Thursday to legs   FLUTICASONE (FLONASE) 50 MCG/ACT NASAL SPRAY    Place 1 spray into both nostrils daily.   HYDROXYZINE (ATARAX/VISTARIL) 25 MG TABLET    Take 25 mg by mouth every 6 (six) hours as needed for itching.   IPRATROPIUM-ALBUTEROL (DUONEB) 0.5-2.5 (3) MG/3ML SOLN    Take 3 mLs by nebulization every 6 (six) hours as needed (as needed for cough).   LISINOPRIL (PRINIVIL,ZESTRIL) 5 MG TABLET    1/2 tablet by mouth daily   LORATADINE (CLARITIN) 10 MG TABLET    Take 10 mg by mouth daily. For Allergies   MIRTAZAPINE (REMERON) 15 MG TABLET    Take 15 mg by  mouth at bedtime.   NON FORMULARY    Med Pass 120 ml by mouth daily for supplement   NYSTATIN (MYCOSTATIN/NYSTOP) 100000 UNIT/GM POWD    Apply to groin and peri area twice a day as needed.   OMEPRAZOLE (PRILOSEC) 20 MG CAPSULE    Take 20 mg by mouth daily.   OXYGEN    Inhale into the lungs. O2 at 4 liters per mask as neededto maintain O2 stats 90% or greater   POLYVINYL ALCOHOL (LIQUIFILM TEARS) 1.4 % OPHTHALMIC SOLUTION    Place 1 drop into both eyes daily. For dry eyes   TAMSULOSIN (FLOMAX) 0.4 MG CAPS CAPSULE    Take 0.4 mg by mouth daily after supper.    VITAMIN B-12 (CYANOCOBALAMIN) 1000 MCG TABLET    Take 1,000 mcg by mouth daily. For supplement  Modified Medications   No medications on file  Discontinued Medications   No medications on file     Physical Exam: Vitals:   11/25/15 0951  BP: 134/75  Pulse: 73  Resp: 20  Temp: 97.5 F (36.4 C)  Weight: 158 lb (71.7 kg)  Height: 5\' 4"  (1.626 m)    Physical Exam  Constitutional: He appears well-developed and well-nourished. No distress.  HENT:  Head: Normocephalic and atraumatic.  Mouth/Throat: Oropharynx is clear and moist. No oropharyngeal exudate.  Eyes: Conjunctivae and EOM are normal. Pupils are equal, round, and reactive to light.  Neck: Normal range of motion. Neck supple.  Cardiovascular: Normal rate, regular rhythm and normal heart sounds.   Pulmonary/Chest: Effort normal.  Abdominal: Soft. Bowel sounds are normal.  Musculoskeletal: He exhibits no edema or tenderness.  Neurological: He is alert.  STML  Skin: Skin is warm and dry. He is not diaphoretic.  Psychiatric: He has a normal mood and affect.    Labs reviewed: Basic Metabolic Panel:  Recent Labs  16/10/96 08/19/15 10/31/15  NA 141 140 144  K 3.7 4.4 3.8  BUN 20 18 24*  CREATININE 0.7 0.7 0.6   Liver Function Tests:  Recent Labs  08/19/15  AST 14  ALT 11  ALKPHOS 44   No results for input(s): LIPASE, AMYLASE in the last 8760 hours. No  results for input(s): AMMONIA in the last 8760 hours. CBC:  Recent Labs  07/24/15 10/30/15 10/31/15  WBC 8.6 6.0 4.3  NEUTROABS  --  3,660 2,623  HGB 9.9* 9.6* 9.0*  HCT 32* 31* 29*  PLT 203 129* 148*   TSH: No results for input(s): TSH in the last 8760 hours. A1C: Lab Results  Component Value Date   HGBA1C 6.2 01/13/2015   Lipid Panel: No results for input(s): CHOL, HDL, LDLCALC, TRIG, CHOLHDL, LDLDIRECT in the last 8760 hours.  Assessment/Plan 1. Hypertensive heart disease  with congestive heart failure (HCC) Blood pressure stable, cont on lisinopril and coreg  2. Chronic systolic CHF (congestive heart failure) (HCC) Euvolemic, conts on lisinopril and coreg  3. Dementia with behavioral disturbance Without significant changes to cognitive function or behavior.   4. BPH (benign prostatic hyperplasia) Stable, to cont on proscar and flomax  5. FTT Ongoing weight loss which is expected with progression of dementia.  cont on current remeron dose and supplements. have attempted dose reduction of remeron in the past and pt did not tolerate change     Luda Charbonneau K. Biagio BorgEubanks, AGNP  [facility] [phone](Monday-Friday 8 am - 5 pm) [phone] (after hours)

## 2015-12-18 ENCOUNTER — Non-Acute Institutional Stay (SKILLED_NURSING_FACILITY): Payer: Medicare Other | Admitting: Nurse Practitioner

## 2015-12-18 ENCOUNTER — Encounter: Payer: Self-pay | Admitting: Nurse Practitioner

## 2015-12-18 DIAGNOSIS — R627 Adult failure to thrive: Secondary | ICD-10-CM | POA: Diagnosis not present

## 2015-12-18 DIAGNOSIS — F03918 Unspecified dementia, unspecified severity, with other behavioral disturbance: Secondary | ICD-10-CM

## 2015-12-18 DIAGNOSIS — F0391 Unspecified dementia with behavioral disturbance: Secondary | ICD-10-CM | POA: Diagnosis not present

## 2015-12-18 DIAGNOSIS — R1310 Dysphagia, unspecified: Secondary | ICD-10-CM

## 2015-12-18 DIAGNOSIS — E119 Type 2 diabetes mellitus without complications: Secondary | ICD-10-CM | POA: Diagnosis not present

## 2015-12-18 DIAGNOSIS — I5022 Chronic systolic (congestive) heart failure: Secondary | ICD-10-CM | POA: Diagnosis not present

## 2015-12-18 NOTE — Progress Notes (Signed)
Patient ID: Astor Gentle, male   DOB: Aug 06, 1914, 82 y.o.   MRN: [id number]    Nursing Home Location:  [facility]  Place of Service: SNF (31)  PCP: Marga Melnick, MD  Allergies  Allergen Reactions  . Ativan [Lorazepam] Other (See Comments)    unknown    Chief Complaint  Patient presents with  . Medical Management of Chronic Issues    Routine Visit    HPI:  Patient is a 82 y.o. male seen today at [facility] for routine follow up on chronic conditions. Pt with a PMH of dementia, HTN, BPH (s/p TURP) GERD, CHF. Staff reported pt with trouble sleeping. Pt currently on remeron for mood, weight and insomnia. Melatonin was recently added. Pt also sleeps a lot during the day. Reports he is "Bored". Pt with ongoing weight loss due to progression of dementia.  Pt without complaints of pain. Staff has no concerns at this time  Review of Systems:  Review of Systems  Unable to perform ROS: Dementia    Past Medical History:  Diagnosis Date  . BPH (benign prostatic hyperplasia)   . Constipation   . Diabetes mellitus without complication (HCC)   . GERD (gastroesophageal reflux disease)   . Hypertension    Past Surgical History:  Procedure Laterality Date  . HERNIA REPAIR    . HIP SURGERY     Social History:   reports that he has quit smoking. He has never used smokeless tobacco. He reports that he does not drink alcohol or use drugs.  Family History  Problem Relation Age of Onset  . Diabetes Mellitus II Mother     Medications: Patient's Medications  New Prescriptions   No medications on file  Previous Medications   ACETAMINOPHEN (TYLENOL) 500 MG TABLET    Take 1,000 mg by mouth every 6 (six) hours as needed for mild pain.    AMBULATORY NON FORMULARY MEDICATION    Magic cup three times daily   ASPIRIN 81 MG TABLET    Take 81 mg by mouth daily.   BETA CAROTENE W/MINERALS (OCUVITE) TABLET    Take 1 tablet by mouth daily. For vision   BISACODYL (DULCOLAX) 10  MG SUPPOSITORY    Place 10 mg rectally daily. If constipation is not relieved by by MOM give 10 mg Bisacodyl suppository rectally x 1 dose in 24 hours as needed   CARVEDILOL (COREG) 3.125 MG TABLET    Take 3.125 mg by mouth 2 (two) times daily with a meal.   CLOTRIMAZOLE (LOTRIMIN) 1 % CREAM    Apply 1 application topically 2 (two) times daily.   EMOLLIENT (GOLD BOND ULTIMATE) LOTN    Apply topically. Apply to back and chest daily   FERROUS SULFATE 325 (65 FE) MG TABLET    Take 325 mg by mouth 2 (two) times daily with a meal.   FINASTERIDE (PROSCAR) 5 MG TABLET    Take 5 mg by mouth daily.   FLUOCINONIDE CREAM (LIDEX) 0.05 %    Apply 1 application topically 2 (two) times daily. Monday through Thursday to legs   FLUTICASONE (FLONASE) 50 MCG/ACT NASAL SPRAY    Place 1 spray into both nostrils daily.   HYDROXYZINE (ATARAX/VISTARIL) 25 MG TABLET    Take 25 mg by mouth every 6 (six) hours as needed for itching.   IPRATROPIUM-ALBUTEROL (DUONEB) 0.5-2.5 (3) MG/3ML SOLN    Take 3 mLs by nebulization every 6 (six) hours as needed (as needed for cough).   LISINOPRIL (  PRINIVIL,ZESTRIL) 5 MG TABLET    1/2 tablet by mouth daily   LORATADINE (CLARITIN) 10 MG TABLET    Take 10 mg by mouth daily. For Allergies   MELATONIN 3 MG TABS    Take 1 tablet by mouth at bedtime.   MIRTAZAPINE (REMERON) 15 MG TABLET    Take 15 mg by mouth at bedtime.   NON FORMULARY    Med Pass 120 ml by mouth daily for supplement   NYSTATIN (MYCOSTATIN/NYSTOP) 100000 UNIT/GM POWD    Apply to groin and peri area twice a day as needed.   OMEPRAZOLE (PRILOSEC) 20 MG CAPSULE    Take 20 mg by mouth daily.   OXYGEN    Inhale into the lungs. O2 at 4 liters per mask as neededto maintain O2 stats 90% or greater   POLYVINYL ALCOHOL (LIQUIFILM TEARS) 1.4 % OPHTHALMIC SOLUTION    Place 1 drop into both eyes 3 (three) times daily. For dry eyes   TAMSULOSIN (FLOMAX) 0.4 MG CAPS CAPSULE    Take 0.4 mg by mouth daily after supper.    VITAMIN B-12  (CYANOCOBALAMIN) 1000 MCG TABLET    Take 1,000 mcg by mouth daily. For supplement  Modified Medications   No medications on file  Discontinued Medications   No medications on file     Physical Exam: Vitals:   12/18/15 1033  BP: 138/71  Pulse: 63  Resp: 18  Temp: 98.2 F (36.8 C)  Weight: 154 lb 3.2 oz (69.9 kg)  Height: 5\' 4"  (1.626 m)     Physical Exam  Constitutional: No distress.  Elderly male NAD  HENT:  Head: Normocephalic and atraumatic.  Mouth/Throat: Oropharynx is clear and moist. No oropharyngeal exudate.  Eyes: Conjunctivae and EOM are normal. Pupils are equal, round, and reactive to light.  Neck: Normal range of motion. Neck supple.  Cardiovascular: Normal rate, regular rhythm and normal heart sounds.   Pulmonary/Chest: Effort normal.  Abdominal: Soft. Bowel sounds are normal.  Musculoskeletal: He exhibits no edema or tenderness.  Neurological: He is alert.  STML  Skin: Skin is warm and dry. He is not diaphoretic.  Psychiatric:  Easily agitated     Labs reviewed: Basic Metabolic Panel:  Recent Labs  11/91/4705/24/17 10/05/15 10/31/15  NA 140 145 144  K 4.4 3.7 3.8  BUN 18 23* 24*  CREATININE 0.7 0.8 0.6   Liver Function Tests:  Recent Labs  08/19/15  AST 14  ALT 11  ALKPHOS 44   No results for input(s): LIPASE, AMYLASE in the last 8760 hours. No results for input(s): AMMONIA in the last 8760 hours. CBC:  Recent Labs  10/05/15 10/30/15 10/31/15  WBC 6.2 6.0 4.3  NEUTROABS  --  3,660 2,623  HGB 10.3* 9.6* 9.0*  HCT 33* 31* 29*  PLT 196 129* 148*   TSH: No results for input(s): TSH in the last 8760 hours. A1C: Lab Results  Component Value Date   HGBA1C 6.2 01/13/2015   Lipid Panel: No results for input(s): CHOL, HDL, LDLCALC, TRIG, CHOLHDL, LDLDIRECT in the last 8760 hours.  Assessment/Plan 1. Chronic systolic CHF (congestive heart failure) (HCC) -euvolemic, conts on coreg BID   2. Dysphagia Stable, to cont aspiration precautions,  no signs of pneumonia at this time.   3. Dementia with behavioral disturbance Ongoing decline in weight noted due to progression of disease Behaviors have been stable, no increase in anxiety or depression noted Melatonin 3 mg qhs started for sleep disturbances   4. FTT (  failure to thrive) in adult Ongoing weight loss due to progression of dementia. conts on Remeron and supplements as well as staff support.   5. Controlled type 2 diabetes mellitus without complication, without long-term current use of insulin (HCC) Not on medication, follow up A1c at this time.       Janene Harvey. Biagio Borg  [facility] [phone] 8 am - 5 pm) [phone] (after hours)

## 2015-12-21 LAB — HEMOGLOBIN A1C: Hemoglobin A1C: 5.6

## 2016-01-13 ENCOUNTER — Encounter: Payer: Self-pay | Admitting: Nurse Practitioner

## 2016-01-13 ENCOUNTER — Non-Acute Institutional Stay (SKILLED_NURSING_FACILITY): Payer: Medicare Other | Admitting: Nurse Practitioner

## 2016-01-13 DIAGNOSIS — N182 Chronic kidney disease, stage 2 (mild): Secondary | ICD-10-CM | POA: Diagnosis not present

## 2016-01-13 DIAGNOSIS — G301 Alzheimer's disease with late onset: Secondary | ICD-10-CM | POA: Diagnosis not present

## 2016-01-13 DIAGNOSIS — F0281 Dementia in other diseases classified elsewhere with behavioral disturbance: Secondary | ICD-10-CM | POA: Diagnosis not present

## 2016-01-13 DIAGNOSIS — R1310 Dysphagia, unspecified: Secondary | ICD-10-CM | POA: Diagnosis not present

## 2016-01-13 DIAGNOSIS — I5022 Chronic systolic (congestive) heart failure: Secondary | ICD-10-CM | POA: Diagnosis not present

## 2016-01-13 DIAGNOSIS — F02818 Dementia in other diseases classified elsewhere, unspecified severity, with other behavioral disturbance: Secondary | ICD-10-CM

## 2016-01-13 DIAGNOSIS — E119 Type 2 diabetes mellitus without complications: Secondary | ICD-10-CM | POA: Diagnosis not present

## 2016-01-13 DIAGNOSIS — I110 Hypertensive heart disease with heart failure: Secondary | ICD-10-CM

## 2016-01-13 NOTE — Progress Notes (Signed)
Patient ID: Riley Martinez, male   DOB: 02-Aug-1914, 82 y.o.   MRN: [id number]    Nursing Home Location:  [facility]  Place of Service: SNF (31)  PCP: Marga Melnick, MD  Allergies  Allergen Reactions  . Ativan [Lorazepam] Other (See Comments)    unknown    Chief Complaint  Patient presents with  . Medical Management of Chronic Issues    Routine Visit    HPI:  Patient is a 82 y.o. male seen today at [facility] for routine follow up on chronic conditions. Pt with a PMH of dementia, HTN, BPH (s/p TURP) GERD, CHF. Pt has been doing well in the last month. Pt had a fall on 10/13 but no injury noted.  Cbc have been followed due to anemia. Pt did not have any complaints. Nursing without concerns.  Pt is wheelchair bound. Transfers from bed to wheelchair only.    Review of Systems:  Review of Systems  Unable to perform ROS: Dementia    Past Medical History:  Diagnosis Date  . BPH (benign prostatic hyperplasia)   . Constipation   . Diabetes mellitus without complication (HCC)   . GERD (gastroesophageal reflux disease)   . Hypertension    Past Surgical History:  Procedure Laterality Date  . HERNIA REPAIR    . HIP SURGERY     Social History:   reports that he has quit smoking. He has never used smokeless tobacco. He reports that he does not drink alcohol or use drugs.  Family History  Problem Relation Age of Onset  . Diabetes Mellitus II Mother     Medications: Patient's Medications  New Prescriptions   No medications on file  Previous Medications   ACETAMINOPHEN (TYLENOL) 500 MG TABLET    Take 1,000 mg by mouth every 6 (six) hours as needed for mild pain.    AMBULATORY NON FORMULARY MEDICATION    Magic cup three times daily   ASPIRIN 81 MG TABLET    Take 81 mg by mouth daily.   BETA CAROTENE W/MINERALS (OCUVITE) TABLET    Take 1 tablet by mouth daily. For vision   BISACODYL (DULCOLAX) 10 MG SUPPOSITORY    Place 10 mg rectally daily. If constipation  is not relieved by by MOM give 10 mg Bisacodyl suppository rectally x 1 dose in 24 hours as needed   CARVEDILOL (COREG) 3.125 MG TABLET    Take 3.125 mg by mouth 2 (two) times daily with a meal.   CLOTRIMAZOLE (LOTRIMIN) 1 % CREAM    Apply 1 application topically 2 (two) times daily.   EMOLLIENT (GOLD BOND ULTIMATE) LOTN    Apply topically. Apply to back and chest daily   FERROUS SULFATE 325 (65 FE) MG TABLET    Take 325 mg by mouth 2 (two) times daily with a meal.   FINASTERIDE (PROSCAR) 5 MG TABLET    Take 5 mg by mouth daily.   FLUOCINONIDE CREAM (LIDEX) 0.05 %    Apply 1 application topically 2 (two) times daily. Monday through Thursday to legs   FLUTICASONE (FLONASE) 50 MCG/ACT NASAL SPRAY    Place 1 spray into both nostrils daily.   HYDROXYZINE (ATARAX/VISTARIL) 25 MG TABLET    Take 25 mg by mouth every 6 (six) hours as needed for itching.   IPRATROPIUM-ALBUTEROL (DUONEB) 0.5-2.5 (3) MG/3ML SOLN    Take 3 mLs by nebulization every 6 (six) hours as needed (as needed for cough).   LISINOPRIL (PRINIVIL,ZESTRIL) 5 MG TABLET  1/2 tablet by mouth daily   LORATADINE (CLARITIN) 10 MG TABLET    Take 10 mg by mouth daily. For Allergies   MELATONIN 3 MG TABS    Take 1 tablet by mouth at bedtime.   MIRTAZAPINE (REMERON) 15 MG TABLET    Take 15 mg by mouth at bedtime.   NON FORMULARY    Med Pass 120 ml by mouth daily for supplement   NYSTATIN (MYCOSTATIN/NYSTOP) 100000 UNIT/GM POWD    Apply to groin and peri area twice a day as needed.   OMEPRAZOLE (PRILOSEC) 20 MG CAPSULE    Take 20 mg by mouth daily.   OXYGEN    Inhale into the lungs. O2 at 4 liters per mask as neededto maintain O2 stats 90% or greater   POLYVINYL ALCOHOL (LIQUIFILM TEARS) 1.4 % OPHTHALMIC SOLUTION    Place 1 drop into both eyes 3 (three) times daily. For dry eyes   TAMSULOSIN (FLOMAX) 0.4 MG CAPS CAPSULE    Take 0.4 mg by mouth daily after supper.    VITAMIN B-12 (CYANOCOBALAMIN) 1000 MCG TABLET    Take 1,000 mcg by mouth daily.  For supplement  Modified Medications   No medications on file  Discontinued Medications   No medications on file     Physical Exam: Vitals:   01/13/16 1109  BP: 126/79  Pulse: 67  Resp: 16  Temp: 98.3 F (36.8 C)  SpO2: 95%  Weight: 156 lb 8 oz (71 kg)  Height: 5\' 4"  (1.626 m)     Physical Exam  Constitutional: No distress.  Elderly male NAD  HENT:  Head: Normocephalic and atraumatic.  Mouth/Throat: Oropharynx is clear and moist. No oropharyngeal exudate.  Eyes: Conjunctivae and EOM are normal. Pupils are equal, round, and reactive to light.  Neck: Normal range of motion. Neck supple.  Cardiovascular: Normal rate, regular rhythm and normal heart sounds.   Pulmonary/Chest: Effort normal.  Abdominal: Soft. Bowel sounds are normal.  Musculoskeletal: He exhibits no edema or tenderness.  Weak LE, in bed  Neurological: He is alert.  STML  Skin: Skin is warm and dry. He is not diaphoretic.  Psychiatric:  Easily agitated     Labs reviewed: Basic Metabolic Panel:  Recent Labs  16/12/9603/24/17 10/05/15 10/31/15  NA 140 145 144  K 4.4 3.7 3.8  BUN 18 23* 24*  CREATININE 0.7 0.8 0.6   Liver Function Tests:  Recent Labs  08/19/15  AST 14  ALT 11  ALKPHOS 44   No results for input(s): LIPASE, AMYLASE in the last 8760 hours. No results for input(s): AMMONIA in the last 8760 hours. CBC:  Recent Labs  10/05/15 10/30/15 10/31/15  WBC 6.2 6.0 4.3  NEUTROABS  --  3,660 2,623  HGB 10.3* 9.6* 9.0*  HCT 33* 31* 29*  PLT 196 129* 148*   TSH: No results for input(s): TSH in the last 8760 hours. A1C: Lab Results  Component Value Date   HGBA1C 6.2 01/13/2015   Lipid Panel: No results for input(s): CHOL, HDL, LDLCALC, TRIG, CHOLHDL, LDLDIRECT in the last 8760 hours.  Assessment/Plan 1. Chronic systolic CHF (congestive heart failure) (HCC) Remains stable, conts on coreg   2. Hypertensive heart disease with congestive heart failure (HCC) Blood pressure stable, conts  on coreg and lisinopril    3. Dysphagia, unspecified type Stable, no signs of aspiration, conts on nectar thick liquid. conts aspriation precautions  4. Controlled type 2 diabetes mellitus without complication, without long-term current use of insulin (HCC)  A1c at goal, not on medication   5. Late onset Alzheimer's disease with behavioral disturbance Advanced dementia, conts with support from staff.   6. CKD (chronic kidney disease) stage 2, GFR 60-89 ml/min Remains stable, proper hydration and to avoid NSAIDS (Aleve, Advil, Motrin, Ibuprofen)    7. Anemia Will follow up CBC and have staff hemoccult stools x 3    Divina Neale K. Biagio Borg  [facility] [phone] 8 am - 5 pm) [phone] (after hours)

## 2016-01-14 LAB — CBC AND DIFFERENTIAL
HCT: 37 % — AB (ref 41–53)
Hemoglobin: 12 g/dL — AB (ref 13.5–17.5)
Platelets: 182 10*3/uL (ref 150–399)
WBC: 7 10^3/mL

## 2016-01-15 LAB — CBC AND DIFFERENTIAL
HEMATOCRIT: 29 % — AB (ref 41–53)
HEMOGLOBIN: 9.1 g/dL — AB (ref 13.5–17.5)
NEUTROS ABS: 4480 /uL
Platelets: 173 10*3/uL (ref 150–399)
WBC: 6.4 10^3/mL

## 2016-02-10 ENCOUNTER — Encounter: Payer: Self-pay | Admitting: Nurse Practitioner

## 2016-02-10 ENCOUNTER — Non-Acute Institutional Stay (SKILLED_NURSING_FACILITY): Payer: Medicare Other | Admitting: Nurse Practitioner

## 2016-02-10 DIAGNOSIS — R627 Adult failure to thrive: Secondary | ICD-10-CM

## 2016-02-10 DIAGNOSIS — K219 Gastro-esophageal reflux disease without esophagitis: Secondary | ICD-10-CM | POA: Diagnosis not present

## 2016-02-10 DIAGNOSIS — F0281 Dementia in other diseases classified elsewhere with behavioral disturbance: Secondary | ICD-10-CM | POA: Diagnosis not present

## 2016-02-10 DIAGNOSIS — G301 Alzheimer's disease with late onset: Secondary | ICD-10-CM

## 2016-02-10 DIAGNOSIS — I110 Hypertensive heart disease with heart failure: Secondary | ICD-10-CM | POA: Diagnosis not present

## 2016-02-10 DIAGNOSIS — F02818 Dementia in other diseases classified elsewhere, unspecified severity, with other behavioral disturbance: Secondary | ICD-10-CM

## 2016-02-10 NOTE — Progress Notes (Signed)
Patient ID: Riley Martinez, male   DOB: [birth year], 82 y.o.   MRN: [id number]    Nursing Home Location:  [facility] Room: 204 B  Place of Service: SNF (31)  PCP: Marga Melnick, MD  Allergies  Allergen Reactions  . Ativan [Lorazepam] Other (See Comments)    unknown    Chief Complaint  Patient presents with  . Medical Management of Chronic Issues    Routine Visit    HPI:  Patient is a 82 y.o. male seen today at [facility] for routine follow up on chronic conditions. Pt with a PMH of dementia, HTN, BPH (s/p TURP) GERD, CHF. In the last month pt with weight loss noted, RD increased medpass to BID. Otherwise there has not been a significant change. Nursing staff without acute concerns at this time   Review of Systems:  Review of Systems  Unable to perform ROS: Dementia    Past Medical History:  Diagnosis Date  . BPH (benign prostatic hyperplasia)   . Constipation   . Diabetes mellitus without complication (HCC)   . GERD (gastroesophageal reflux disease)   . Hypertension    Past Surgical History:  Procedure Laterality Date  . HERNIA REPAIR    . HIP SURGERY     Social History:   reports that he has quit smoking. He has never used smokeless tobacco. He reports that he does not drink alcohol or use drugs.  Family History  Problem Relation Age of Onset  . Diabetes Mellitus II Mother     Medications: Patient's Medications  New Prescriptions   No medications on file  Previous Medications   ACETAMINOPHEN (TYLENOL) 500 MG TABLET    Take 1,000 mg by mouth every 6 (six) hours as needed for mild pain.    AMBULATORY NON FORMULARY MEDICATION    Magic cup three times daily   ASPIRIN 81 MG TABLET    Take 81 mg by mouth daily.   BETA CAROTENE W/MINERALS (OCUVITE) TABLET    Take 1 tablet by mouth daily. For vision   BISACODYL (DULCOLAX) 10 MG SUPPOSITORY    Place 10 mg rectally daily. If constipation is not relieved by by MOM give 10 mg Bisacodyl suppository  rectally x 1 dose in 24 hours as needed   CARVEDILOL (COREG) 3.125 MG TABLET    Take 3.125 mg by mouth 2 (two) times daily with a meal.   EMOLLIENT (GOLD BOND ULTIMATE) LOTN    Apply topically. Apply to back and chest daily   FERROUS SULFATE 325 (65 FE) MG TABLET    Take 325 mg by mouth 2 (two) times daily with a meal.   FINASTERIDE (PROSCAR) 5 MG TABLET    Take 5 mg by mouth daily.   FLUOCINONIDE CREAM (LIDEX) 0.05 %    Apply 1 application topically 2 (two) times daily. Monday through Thursday to legs   HYDROXYZINE (ATARAX/VISTARIL) 25 MG TABLET    Take 25 mg by mouth every 6 (six) hours as needed for itching.   IPRATROPIUM-ALBUTEROL (DUONEB) 0.5-2.5 (3) MG/3ML SOLN    Take 3 mLs by nebulization every 6 (six) hours as needed (as needed for cough).   LISINOPRIL (PRINIVIL,ZESTRIL) 5 MG TABLET    1/2 tablet by mouth daily   MELATONIN 3 MG TABS    Take 1 tablet by mouth at bedtime.   MIRTAZAPINE (REMERON) 15 MG TABLET    Take 15 mg by mouth at bedtime.   NON FORMULARY    Med Pass 120 ml  by mouth daily for supplement   NYSTATIN (MYCOSTATIN/NYSTOP) 100000 UNIT/GM POWD    Apply to groin and peri area twice a day as needed.   OMEPRAZOLE (PRILOSEC) 20 MG CAPSULE    Take 20 mg by mouth daily.   OXYGEN    Inhale into the lungs. O2 at 4 liters per mask as neededto maintain O2 stats 90% or greater   POLYVINYL ALCOHOL (LIQUIFILM TEARS) 1.4 % OPHTHALMIC SOLUTION    Place 1 drop into both eyes 3 (three) times daily. For dry eyes   TAMSULOSIN (FLOMAX) 0.4 MG CAPS CAPSULE    Take 0.4 mg by mouth daily after supper.    VITAMIN B-12 (CYANOCOBALAMIN) 1000 MCG TABLET    Take 1,000 mcg by mouth daily. For supplement  Modified Medications   No medications on file  Discontinued Medications   CLOTRIMAZOLE (LOTRIMIN) 1 % CREAM    Apply 1 application topically 2 (two) times daily.   FLUTICASONE (FLONASE) 50 MCG/ACT NASAL SPRAY    Place 1 spray into both nostrils daily.   LORATADINE (CLARITIN) 10 MG TABLET    Take 10 mg  by mouth daily. For Allergies     Physical Exam: Vitals:   02/10/16 1052  BP: 121/74  Pulse: 60  Temp: 97.8 F (36.6 C)  Weight: 156 lb (70.8 kg)  Height: 5\' 4"  (1.626 m)     Physical Exam  Constitutional: No distress.  Elderly male NAD  HENT:  Head: Normocephalic and atraumatic.  Mouth/Throat: Oropharynx is clear and moist. No oropharyngeal exudate.  Eyes: Conjunctivae and EOM are normal. Pupils are equal, round, and reactive to light.  Neck: Normal range of motion. Neck supple.  Cardiovascular: Normal rate, regular rhythm and normal heart sounds.   Pulmonary/Chest: Effort normal.  Abdominal: Soft. Bowel sounds are normal.  Musculoskeletal: He exhibits no edema or tenderness.  Weak LE, in bed  Neurological: He is alert.  STML  Skin: Skin is warm and dry. He is not diaphoretic.  Psychiatric: Cognition and memory are impaired. He exhibits abnormal recent memory and abnormal remote memory.    Labs reviewed: Basic Metabolic Panel:  Recent Labs  16/12/9603/24/17 10/05/15 10/31/15  NA 140 145 144  K 4.4 3.7 3.8  BUN 18 23* 24*  CREATININE 0.7 0.8 0.6   Liver Function Tests:  Recent Labs  08/19/15  AST 14  ALT 11  ALKPHOS 44   No results for input(s): LIPASE, AMYLASE in the last 8760 hours. No results for input(s): AMMONIA in the last 8760 hours. CBC:  Recent Labs  10/30/15 10/31/15 01/14/16 01/15/16  WBC 6.0 4.3 7.0 6.4  NEUTROABS 3,660 2,623  --  4,480  HGB 9.6* 9.0* 12.0* 9.1*  HCT 31* 29* 37* 29*  PLT 129* 148* 182 173   TSH: No results for input(s): TSH in the last 8760 hours. A1C: Lab Results  Component Value Date   HGBA1C 5.6 12/21/2015   Lipid Panel: No results for input(s): CHOL, HDL, LDLCALC, TRIG, CHOLHDL, LDLDIRECT in the last 8760 hours.  Assessment/Plan 1. Late onset Alzheimer's disease with behavioral disturbance Without acute decline in cognitive or functional status. No increase in behaviors noted. Weight loss noted which is due to  disease progression. Staff provides assistance with all ADLs for patient care.   2. Hypertensive heart disease with congestive heart failure (HCC) Blood pressure stable, euvolemic at this time. conts on lisinopril and coreg.   3. Gastroesophageal reflux disease without esophagitis Stable on omeprazole.   4. FTT (failure to  thrive) in adult FTT due to progression of dementia, Weight loss had been stable but recently with decrease in weight. RD increased medpass to BID. conts on remeron qhs   [employer] Sciences CorporationJessica K. Biagio BorgEubanks, AGNP  [facility] [phone](Monday-Friday 8 am - 5 pm) [phone] (after hours)

## 2016-03-04 ENCOUNTER — Encounter: Payer: Self-pay | Admitting: Nurse Practitioner

## 2016-03-04 ENCOUNTER — Non-Acute Institutional Stay (SKILLED_NURSING_FACILITY): Payer: Medicare Other | Admitting: Nurse Practitioner

## 2016-03-04 DIAGNOSIS — D509 Iron deficiency anemia, unspecified: Secondary | ICD-10-CM | POA: Diagnosis not present

## 2016-03-04 DIAGNOSIS — F0281 Dementia in other diseases classified elsewhere with behavioral disturbance: Secondary | ICD-10-CM

## 2016-03-04 DIAGNOSIS — K219 Gastro-esophageal reflux disease without esophagitis: Secondary | ICD-10-CM

## 2016-03-04 DIAGNOSIS — R350 Frequency of micturition: Secondary | ICD-10-CM

## 2016-03-04 DIAGNOSIS — I5022 Chronic systolic (congestive) heart failure: Secondary | ICD-10-CM

## 2016-03-04 DIAGNOSIS — G301 Alzheimer's disease with late onset: Secondary | ICD-10-CM | POA: Diagnosis not present

## 2016-03-04 DIAGNOSIS — N401 Enlarged prostate with lower urinary tract symptoms: Secondary | ICD-10-CM

## 2016-03-04 DIAGNOSIS — F02818 Dementia in other diseases classified elsewhere, unspecified severity, with other behavioral disturbance: Secondary | ICD-10-CM

## 2016-03-04 NOTE — Progress Notes (Signed)
Patient ID: Riley Martinez, male   DOB: June 06, 1914, 82 y.o.   MRN: [id number]    Nursing Home [facility]  [facility] Room: 204 B  Place of Service: SNF (31)  PCP: Marga MelnickWilliam Hopper, MD  Allergies  Allergen Reactions  . Ativan [Lorazepam] Other (See Comments)    unknown    Chief Complaint  Patient presents with  . Medical Management of Chronic Issues    Routine Visit    HPI:  Patient is a 82 y.o. male [facility] up on chronic conditions. Pt with a PMH of dementia, HTN, BPH (s/p TURP) GERD, CHF. Pt has been doing well in the last month. There has been no acute issues noted. conts to slowly lose weight despite supplements and remeron due to disease progression of dementia. Pt has no complaints today and staff without concerns.   Review of Systems:  Review of Systems  Unable to perform ROS: Dementia    Past Medical History:  Diagnosis Date  . BPH (benign prostatic hyperplasia)   . Constipation   . Diabetes mellitus without complication (HCC)   . GERD (gastroesophageal reflux disease)   . Hypertension    Past Surgical History:  Procedure Laterality Date  . HERNIA REPAIR    . HIP SURGERY     Social History:   reports that he has quit smoking. He has never used smokeless tobacco. He reports that he does not drink alcohol or use drugs.  Family History  Problem Relation Age of Onset  . Diabetes Mellitus II Mother     Medications: Patient's Medications  New Prescriptions   No medications on file  Previous Medications   ACETAMINOPHEN (TYLENOL) 500 MG TABLET    Take 1,000 mg by mouth every 6 (six) hours as needed for mild pain.    AMBULATORY NON FORMULARY MEDICATION    Magic cup three times daily   ASPIRIN 81 MG TABLET    Take 81 mg by mouth daily.   BETA CAROTENE W/MINERALS (OCUVITE) TABLET    Take 1 tablet by mouth daily. For vision   BISACODYL (DULCOLAX) 10 MG SUPPOSITORY    Place 10 mg rectally daily. If constipation is  not relieved by by MOM give 10 mg Bisacodyl suppository rectally x 1 dose in 24 hours as needed   CARVEDILOL (COREG) 3.125 MG TABLET    Take 3.125 mg by mouth 2 (two) times daily with a meal.   EMOLLIENT (GOLD BOND ULTIMATE) LOTN    Apply topically. Apply to back and chest daily   FERROUS SULFATE 325 (65 FE) MG TABLET    Take 325 mg by mouth 2 (two) times daily with a meal.   FINASTERIDE (PROSCAR) 5 MG TABLET    Take 5 mg by mouth daily.   HYDROXYZINE (ATARAX/VISTARIL) 25 MG TABLET    Take 25 mg by mouth every 6 (six) hours as needed for itching.   IPRATROPIUM-ALBUTEROL (DUONEB) 0.5-2.5 (3) MG/3ML SOLN    Take 3 mLs by nebulization every 6 (six) hours as needed (as needed for cough).   LISINOPRIL (PRINIVIL,ZESTRIL) 5 MG TABLET    1/2 tablet by mouth daily   MELATONIN 3 MG TABS    Take 1 tablet by mouth at bedtime.   MIRTAZAPINE (REMERON) 15 MG TABLET    Take 15 mg by mouth at bedtime.   NON FORMULARY    Med Pass 120 ml by mouth daily for supplement   NYSTATIN (MYCOSTATIN/NYSTOP) 100000 UNIT/GM POWD  Apply to groin and peri area twice a day as needed.   OMEPRAZOLE (PRILOSEC) 20 MG CAPSULE    Take 20 mg by mouth daily.   OXYGEN    Inhale into the lungs. O2 at 4 liters per mask as neededto maintain O2 stats 90% or greater   POLYVINYL ALCOHOL (LIQUIFILM TEARS) 1.4 % OPHTHALMIC SOLUTION    Place 1 drop into both eyes 3 (three) times daily. For dry eyes   TAMSULOSIN (FLOMAX) 0.4 MG CAPS CAPSULE    Take 0.4 mg by mouth daily after supper.    VITAMIN B-12 (CYANOCOBALAMIN) 1000 MCG TABLET    Take 1,000 mcg by mouth daily. For supplement  Modified Medications   No medications on file  Discontinued Medications   FLUOCINONIDE CREAM (LIDEX) 0.05 %    Apply 1 application topically 2 (two) times daily. Monday through Thursday to legs     Physical Exam: Vitals:   03/04/16 1321  BP: 132/74  Pulse: 81  Resp: 17  Temp: 98.3 F (36.8 C)  Weight: 155 lb (70.3 kg)  Height: 5\' 4"  (1.626 m)      Physical Exam  Constitutional: No distress.  Elderly male NAD  HENT:  Head: Normocephalic and atraumatic.  Mouth/Throat: Oropharynx is clear and moist. No oropharyngeal exudate.  Eyes: Conjunctivae and EOM are normal. Pupils are equal, round, and reactive to light.  Neck: Normal range of motion. Neck supple.  Cardiovascular: Normal rate, regular rhythm and normal heart sounds.   Pulmonary/Chest: Effort normal.  Abdominal: Soft. Bowel sounds are normal.  Musculoskeletal: He exhibits no edema or tenderness.  Weak LE  Neurological: He is alert.  STML  Skin: Skin is warm and dry. He is not diaphoretic.  Psychiatric: Cognition and memory are impaired. He exhibits abnormal recent memory and abnormal remote memory.    Labs reviewed: Basic Metabolic Panel:  Recent Labs  16/12/9603/24/17 10/05/15 10/31/15  NA 140 145 144  K 4.4 3.7 3.8  BUN 18 23* 24*  CREATININE 0.7 0.8 0.6   Liver Function Tests:  Recent Labs  08/19/15  AST 14  ALT 11  ALKPHOS 44   No results for input(s): LIPASE, AMYLASE in the last 8760 hours. No results for input(s): AMMONIA in the last 8760 hours. CBC:  Recent Labs  10/30/15 10/31/15 01/14/16 01/15/16  WBC 6.0 4.3 7.0 6.4  NEUTROABS 3,660 2,623  --  4,480  HGB 9.6* 9.0* 12.0* 9.1*  HCT 31* 29* 37* 29*  PLT 129* 148* 182 173   TSH: No results for input(s): TSH in the last 8760 hours. A1C: Lab Results  Component Value Date   HGBA1C 5.6 12/21/2015   Lipid Panel: No results for input(s): CHOL, HDL, LDLCALC, TRIG, CHOLHDL, LDLDIRECT in the last 8760 hours.  Assessment/Plan 1. Benign prostatic hyperplasia with urinary frequency -remains stable, conts on proscar and flomax  2. Chronic systolic CHF (congestive heart failure) (HCC) -remains stable. conts on coreg and lisinopril   3. Iron deficiency anemia, unspecified iron deficiency anemia type Stable on last labs, no signs of acute blood loss. Remains on iron supplements  4. Late onset  Alzheimer's disease with behavioral disturbance Advanced dementia, wight loss noted which is due to progressive disease. conts current regimen.   5. Gastroesophageal reflux disease without esophagitis Stable on omeprazole.    Janene HarveyJessica K. Biagio BorgEubanks, AGNP  [facility] [phone](Monday-Friday 8 am - 5 pm) [phone] (after hours)

## 2016-05-06 ENCOUNTER — Non-Acute Institutional Stay (SKILLED_NURSING_FACILITY): Payer: Medicare Other | Admitting: Nurse Practitioner

## 2016-05-06 DIAGNOSIS — R627 Adult failure to thrive: Secondary | ICD-10-CM | POA: Diagnosis not present

## 2016-05-06 DIAGNOSIS — G301 Alzheimer's disease with late onset: Secondary | ICD-10-CM | POA: Diagnosis not present

## 2016-05-06 DIAGNOSIS — I110 Hypertensive heart disease with heart failure: Secondary | ICD-10-CM

## 2016-05-06 DIAGNOSIS — K5900 Constipation, unspecified: Secondary | ICD-10-CM | POA: Diagnosis not present

## 2016-05-06 DIAGNOSIS — R1310 Dysphagia, unspecified: Secondary | ICD-10-CM

## 2016-05-06 DIAGNOSIS — F02818 Alzheimer's disease with late onset: Secondary | ICD-10-CM

## 2016-05-06 DIAGNOSIS — F0281 Dementia in other diseases classified elsewhere with behavioral disturbance: Secondary | ICD-10-CM

## 2016-05-06 NOTE — Progress Notes (Signed)
Patient ID: Riley Martinez, male   DOB: 1915/02/16, 83 y.o.   MRN: [id number]    Nursing Home Location:  [facility] Room: 204 B  Place of Service: SNF (31)  PCP: Marga Melnick, MD   Code Status: DNR  Allergies  Allergen Reactions  . Ativan [Lorazepam] Other (See Comments)    unknown    Chief Complaint  Patient presents with  . Medical Management of Chronic Issues    Routine Visit    HPI:  Patient is a 83 y.o. male seen today at [facility] for routine follow up on chronic conditions. Pt with a PMH of dementia, HTN, BPH (s/p TURP) GERD, CHF.  Pt has had several falls since the last visit, No injury noted at this time. No new behaviors noted. Weight has trended down over the last month. Pt conts on remeron and supplements. No changes noted by staff to bowel or bladder. No pain noted.   Review of Systems:  Review of Systems  Unable to perform ROS: Dementia    Past Medical History:  Diagnosis Date  . BPH (benign prostatic hyperplasia)   . Constipation   . Diabetes mellitus without complication (HCC)   . GERD (gastroesophageal reflux disease)   . Hypertension    Past Surgical History:  Procedure Laterality Date  . HERNIA REPAIR    . HIP SURGERY     Social History:   reports that he has quit smoking. He has never used smokeless tobacco. He reports that he does not drink alcohol or use drugs.  Family History  Problem Relation Age of Onset  . Diabetes Mellitus II Mother     Medications: Patient's Medications  New Prescriptions   No medications on file  Previous Medications   ACETAMINOPHEN (TYLENOL) 500 MG TABLET    Take 1,000 mg by mouth every 6 (six) hours as needed for mild pain.    AMBULATORY NON FORMULARY MEDICATION    Magic cup three times daily   ASPIRIN 81 MG TABLET    Take 81 mg by mouth daily.   BETA CAROTENE W/MINERALS (OCUVITE) TABLET    Take 1 tablet by mouth daily. For vision   BISACODYL (DULCOLAX) 10 MG SUPPOSITORY    Place 10 mg  rectally daily. If constipation is not relieved by by MOM give 10 mg Bisacodyl suppository rectally x 1 dose in 24 hours as needed   CARVEDILOL (COREG) 3.125 MG TABLET    Take 3.125 mg by mouth 2 (two) times daily with a meal.   EMOLLIENT (GOLD BOND ULTIMATE) LOTN    Apply topically. Apply to back and chest daily   FERROUS SULFATE 325 (65 FE) MG TABLET    Take 325 mg by mouth 2 (two) times daily with a meal.   FINASTERIDE (PROSCAR) 5 MG TABLET    Take 5 mg by mouth daily.   FLUOCINONIDE CREAM (LIDEX) 0.05 %    Apply to lower legs twice daily on Mon, Tues, Wed, and Thurs   HYDROXYZINE (ATARAX/VISTARIL) 25 MG TABLET    Take 25 mg by mouth every 6 (six) hours as needed for itching.   IPRATROPIUM-ALBUTEROL (DUONEB) 0.5-2.5 (3) MG/3ML SOLN    Take 3 mLs by nebulization every 6 (six) hours as needed (as needed for cough).   LISINOPRIL (PRINIVIL,ZESTRIL) 5 MG TABLET    1/2 tablet by mouth daily   MELATONIN 3 MG TABS    Take 1 tablet by mouth at bedtime.   MIRTAZAPINE (REMERON) 15 MG TABLET  Take 15 mg by mouth at bedtime.   NON FORMULARY    Med Pass 120 ml by mouth daily for supplement   NYSTATIN (MYCOSTATIN/NYSTOP) 100000 UNIT/GM POWD    Apply to groin and peri area twice a day as needed.   OMEPRAZOLE (PRILOSEC) 20 MG CAPSULE    Take 20 mg by mouth daily.   OXYGEN    Inhale into the lungs. O2 at 4 liters per mask as neededto maintain O2 stats 90% or greater   POLYVINYL ALCOHOL (LIQUIFILM TEARS) 1.4 % OPHTHALMIC SOLUTION    Place 1 drop into both eyes 3 (three) times daily. For dry eyes   TAMSULOSIN (FLOMAX) 0.4 MG CAPS CAPSULE    Take 0.4 mg by mouth daily after supper.    VITAMIN B-12 (CYANOCOBALAMIN) 1000 MCG TABLET    Take 1,000 mcg by mouth daily. For supplement  Modified Medications   No medications on file  Discontinued Medications   No medications on file     Physical Exam: Vitals:   05/06/16 1152  BP: 136/70  Pulse: 70  Resp: 18  Temp: (!) 96.6 F (35.9 C)  SpO2: 96%  Weight:  146 lb 12.8 oz (66.6 kg)  Height: 5\' 4"  (1.626 m)     Physical Exam  Constitutional: No distress.  Elderly male NAD  HENT:  Head: Normocephalic and atraumatic.  Mouth/Throat: Oropharynx is clear and moist. No oropharyngeal exudate.  Eyes: Conjunctivae and EOM are normal. Pupils are equal, round, and reactive to light.  Neck: Normal range of motion. Neck supple.  Cardiovascular: Normal rate, regular rhythm and normal heart sounds.   Pulmonary/Chest: Effort normal.  Abdominal: Soft. Bowel sounds are normal.  Musculoskeletal: He exhibits no edema or tenderness.  Weak LE  Neurological: He is alert.  STML  Skin: Skin is warm and dry. He is not diaphoretic.  Psychiatric: Cognition and memory are impaired. He exhibits abnormal recent memory and abnormal remote memory.    Labs reviewed: Basic Metabolic Panel:  Recent Labs  16/12/9603/24/17 10/05/15 10/31/15  NA 140 145 144  K 4.4 3.7 3.8  BUN 18 23* 24*  CREATININE 0.7 0.8 0.6   Liver Function Tests:  Recent Labs  08/19/15  AST 14  ALT 11  ALKPHOS 44   No results for input(s): LIPASE, AMYLASE in the last 8760 hours. No results for input(s): AMMONIA in the last 8760 hours. CBC:  Recent Labs  10/30/15 10/31/15 01/14/16 01/15/16  WBC 6.0 4.3 7.0 6.4  NEUTROABS 3,660 2,623  --  4,480  HGB 9.6* 9.0* 12.0* 9.1*  HCT 31* 29* 37* 29*  PLT 129* 148* 182 173   TSH: No results for input(s): TSH in the last 8760 hours. A1C: Lab Results  Component Value Date   HGBA1C 5.6 12/21/2015   Lipid Panel: No results for input(s): CHOL, HDL, LDLCALC, TRIG, CHOLHDL, LDLDIRECT in the last 8760 hours.  Assessment/Plan 1. Late onset Alzheimer's disease with behavioral disturbance Advanced dementia, without acute changes in the last month, slow decline noted.   2. Constipation, unspecified constipation type Stable, will cont current regimen   3. Hypertensive heart disease with congestive heart failure (HCC) Stable, euvolemic and Blood  pressures stable, conts on coreg   4. FTT (failure to thrive) in adult 10 lb weight loss in 4 months. conts on remeron and supplements, due to worsening of dementia expect ongoing decline with weight and cognitive status as disease progresses  5. Dysphagia, unspecified type Stable, no signs of aspiration at this time.  Carlos American. Harle Battiest  [facility] [phone] 8 am - 5 pm) [phone] (after hours)

## 2016-05-11 ENCOUNTER — Non-Acute Institutional Stay (SKILLED_NURSING_FACILITY): Payer: Medicare Other | Admitting: Nurse Practitioner

## 2016-05-11 ENCOUNTER — Encounter: Payer: Self-pay | Admitting: Nurse Practitioner

## 2016-05-11 DIAGNOSIS — S6990XA Unspecified injury of unspecified wrist, hand and finger(s), initial encounter: Secondary | ICD-10-CM

## 2016-05-11 NOTE — Progress Notes (Signed)
Patient ID: Riley Martinez, male   DOB: Oct 05, 1914, 83 y.o.   MRN: [id number]    Nursing Home Location:  [facility] Room: 204 B  Place of Service: SNF (31)  PCP: Marga MelnickWilliam Hopper, MD   Code Status: DNR  Allergies  Allergen Reactions  . Ativan [Lorazepam] Other (See Comments)    unknown    Chief Complaint  Patient presents with  . Acute Visit    Injury to right finger    HPI:  Patient is a 83 y.o. male [facility] for after fall with finger swelling. Pt with a PMH of dementia, HTN, BPH (s/p TURP) GERD, CHF. Pt had fall over this past weekend (5-6 days ago) and nursing noticed increased swelling and pain over the past day with decrease ROM to right middle finger.  Pt with dementia and poor historian, reports finger hurts. Nursing reports this is unrelieved by PRN tylenol.   Review of Systems:  Review of Systems  Unable to perform ROS: Dementia    Past Medical History:  Diagnosis Date  . BPH (benign prostatic hyperplasia)   . Constipation   . Diabetes mellitus without complication (HCC)   . GERD (gastroesophageal reflux disease)   . Hypertension    Past Surgical History:  Procedure Laterality Date  . HERNIA REPAIR    . HIP SURGERY     Social History:   reports that he has quit smoking. He has never used smokeless tobacco. He reports that he does not drink alcohol or use drugs.  Family History  Problem Relation Age of Onset  . Diabetes Mellitus II Mother     Medications: Patient's Medications  New Prescriptions   No medications on file  Previous Medications   ACETAMINOPHEN (TYLENOL) 500 MG TABLET    Take 1,000 mg by mouth every 6 (six) hours as needed for mild pain.    AMBULATORY NON FORMULARY MEDICATION    Magic cup three times daily   ASPIRIN 81 MG TABLET    Take 81 mg by mouth daily.   BETA CAROTENE W/MINERALS (OCUVITE) TABLET    Take 1 tablet by mouth daily. For vision   BISACODYL (DULCOLAX) 10 MG SUPPOSITORY    Place 10 mg  rectally daily. If constipation is not relieved by by MOM give 10 mg Bisacodyl suppository rectally x 1 dose in 24 hours as needed   CARVEDILOL (COREG) 3.125 MG TABLET    Take 3.125 mg by mouth 2 (two) times daily with a meal.   EMOLLIENT (GOLD BOND ULTIMATE) LOTN    Apply topically. Apply to back and chest daily   FERROUS SULFATE 325 (65 FE) MG TABLET    Take 325 mg by mouth 2 (two) times daily with a meal.   FINASTERIDE (PROSCAR) 5 MG TABLET    Take 5 mg by mouth daily.   FLUOCINONIDE CREAM (LIDEX) 0.05 %    Apply to lower legs twice daily on Mon, Tues, Wed, and Thurs   HYDROXYZINE (ATARAX/VISTARIL) 25 MG TABLET    Take 25 mg by mouth every 6 (six) hours as needed for itching.   IPRATROPIUM-ALBUTEROL (DUONEB) 0.5-2.5 (3) MG/3ML SOLN    Take 3 mLs by nebulization every 6 (six) hours as needed (as needed for cough).   LISINOPRIL (PRINIVIL,ZESTRIL) 5 MG TABLET    1/2 tablet by mouth daily   MELATONIN 3 MG TABS    Take 1 tablet by mouth at bedtime.   MIRTAZAPINE (REMERON) 15 MG TABLET  Take 15 mg by mouth at bedtime.   NON FORMULARY    Med Pass 120 ml by mouth daily for supplement   NYSTATIN (MYCOSTATIN/NYSTOP) 100000 UNIT/GM POWD    Apply to groin and peri area twice a day as needed.   OMEPRAZOLE (PRILOSEC) 20 MG CAPSULE    Take 20 mg by mouth daily.   OXYGEN    Inhale into the lungs. O2 at 4 liters per mask as neededto maintain O2 stats 90% or greater   POLYVINYL ALCOHOL (LIQUIFILM TEARS) 1.4 % OPHTHALMIC SOLUTION    Place 1 drop into both eyes 3 (three) times daily. For dry eyes   TAMSULOSIN (FLOMAX) 0.4 MG CAPS CAPSULE    Take 0.4 mg by mouth daily after supper.    VITAMIN B-12 (CYANOCOBALAMIN) 1000 MCG TABLET    Take 1,000 mcg by mouth daily. For supplement  Modified Medications   No medications on file  Discontinued Medications   No medications on file     Physical Exam: Vitals:   05/11/16 1128  BP: 122/74  Pulse: 73  Resp: 16  Temp: (!) 96.3 F (35.7 C)  SpO2: 94%  Weight:  146 lb 12.8 oz (66.6 kg)  Height: 5\' 4"  (1.626 m)     Physical Exam  Constitutional: No distress.  Elderly male NAD  HENT:  Head: Normocephalic and atraumatic.  Mouth/Throat: Oropharynx is clear and moist. No oropharyngeal exudate.  Eyes: Conjunctivae and EOM are normal. Pupils are equal, round, and reactive to light.  Neck: Normal range of motion. Neck supple.  Cardiovascular: Normal rate, regular rhythm and normal heart sounds.   Pulmonary/Chest: Effort normal.  Abdominal: Soft. Bowel sounds are normal.  Musculoskeletal: He exhibits edema and tenderness.  Increase swelling noted to 3rd finger with decrease ROM, pain and bruising noted to right hand at the base of 2,3,4th digit.    Neurological: He is alert.  STML  Skin: Skin is warm and dry. He is not diaphoretic.  Psychiatric: Cognition and memory are impaired. He exhibits abnormal recent memory and abnormal remote memory.    Labs reviewed: Basic Metabolic Panel:  Recent Labs  40/98/11 10/05/15 10/31/15  NA 140 145 144  K 4.4 3.7 3.8  BUN 18 23* 24*  CREATININE 0.7 0.8 0.6   Liver Function Tests:  Recent Labs  08/19/15  AST 14  ALT 11  ALKPHOS 44   No results for input(s): LIPASE, AMYLASE in the last 8760 hours. No results for input(s): AMMONIA in the last 8760 hours. CBC:  Recent Labs  10/30/15 10/31/15 01/14/16 01/15/16  WBC 6.0 4.3 7.0 6.4  NEUTROABS 3,660 2,623  --  4,480  HGB 9.6* 9.0* 12.0* 9.1*  HCT 31* 29* 37* 29*  PLT 129* 148* 182 173   TSH: No results for input(s): TSH in the last 8760 hours. A1C: Lab Results  Component Value Date   HGBA1C 5.6 12/21/2015   Lipid Panel: No results for input(s): CHOL, HDL, LDLCALC, TRIG, CHOLHDL, LDLDIRECT in the last 8760 hours.  Assessment/Plan 1. Finger injury, initial encounter Will get xray to rule out fracture. To use meloxicam 7.5 mg by mouth daily for 14 days due to pain and swelling.  Cont tylenol as needed -may use ice as needed    Guillermo Nehring  K. Biagio Borg  [facility] [phone] 8 am - 5 pm) [phone] (after hours)

## 2016-05-12 ENCOUNTER — Encounter (HOSPITAL_COMMUNITY): Payer: Self-pay | Admitting: Emergency Medicine

## 2016-05-12 ENCOUNTER — Emergency Department (HOSPITAL_COMMUNITY)
Admission: EM | Admit: 2016-05-12 | Discharge: 2016-05-12 | Disposition: A | Discharge status: Home / Self Care | Payer: Medicare Other | Attending: Emergency Medicine | Admitting: Emergency Medicine

## 2016-05-12 ENCOUNTER — Emergency Department (HOSPITAL_COMMUNITY): Payer: Medicare Other

## 2016-05-12 DIAGNOSIS — I130 Hypertensive heart and chronic kidney disease with heart failure and stage 1 through stage 4 chronic kidney disease, or unspecified chronic kidney disease: Secondary | ICD-10-CM | POA: Insufficient documentation

## 2016-05-12 DIAGNOSIS — Z7982 Long term (current) use of aspirin: Secondary | ICD-10-CM | POA: Diagnosis not present

## 2016-05-12 DIAGNOSIS — W19XXXA Unspecified fall, initial encounter: Secondary | ICD-10-CM | POA: Insufficient documentation

## 2016-05-12 DIAGNOSIS — Y939 Activity, unspecified: Secondary | ICD-10-CM | POA: Insufficient documentation

## 2016-05-12 DIAGNOSIS — Z87891 Personal history of nicotine dependence: Secondary | ICD-10-CM | POA: Diagnosis not present

## 2016-05-12 DIAGNOSIS — S6991XA Unspecified injury of right wrist, hand and finger(s), initial encounter: Secondary | ICD-10-CM | POA: Diagnosis present

## 2016-05-12 DIAGNOSIS — I5022 Chronic systolic (congestive) heart failure: Secondary | ICD-10-CM | POA: Insufficient documentation

## 2016-05-12 DIAGNOSIS — Z79899 Other long term (current) drug therapy: Secondary | ICD-10-CM | POA: Diagnosis not present

## 2016-05-12 DIAGNOSIS — E1122 Type 2 diabetes mellitus with diabetic chronic kidney disease: Secondary | ICD-10-CM | POA: Diagnosis not present

## 2016-05-12 DIAGNOSIS — Y929 Unspecified place or not applicable: Secondary | ICD-10-CM | POA: Insufficient documentation

## 2016-05-12 DIAGNOSIS — S62642A Nondisplaced fracture of proximal phalanx of right middle finger, initial encounter for closed fracture: Secondary | ICD-10-CM | POA: Diagnosis not present

## 2016-05-12 DIAGNOSIS — Y999 Unspecified external cause status: Secondary | ICD-10-CM | POA: Insufficient documentation

## 2016-05-12 DIAGNOSIS — N182 Chronic kidney disease, stage 2 (mild): Secondary | ICD-10-CM | POA: Insufficient documentation

## 2016-05-12 NOTE — ED Notes (Signed)
[facility], [facility], pt to follow up with Ortho Surgeon on a week.

## 2016-05-12 NOTE — ED Triage Notes (Signed)
Pt brought to ED [facility] for after having a fall a week ago, today started c/o right hand pain, x ray completed on SNF pt having a right index fx.

## 2016-05-12 NOTE — ED Provider Notes (Addendum)
[facility]-EMERGENCY DEPT Provider Note   CSN: [id number] Arrival date & time: 05/12/16  0100  By signing my name below, I, Freida Busmaniana Omoyeni, attest that this documentation has been prepared under the direction and in the presence of Gilda Creasehristopher J Porschea Borys, MD . Electronically Signed: Freida Busmaniana Omoyeni, Scribe. 05/12/2016. 1:22 AM.  History   Chief Complaint Chief Complaint  Patient presents with  . Fall   LEVEL 5 CAVEAT DUE TO DEMENTIA   The history is provided by the patient. No language interpreter was used.     HPI Comments:  Riley Martinez is a 83 y.o. male with a history of Dementia, who presents to the Emergency Department from NH s/p fall 1 week ago complaining of right hand pain. At this time he also notes back pain. He denies HA. Per triage note pt had an XR PTA that showed fracture of the right index finger. Pt is a poor historian.   Past Medical History:  Diagnosis Date  . BPH (benign prostatic hyperplasia)   . Constipation   . Diabetes mellitus without complication (HCC)   . GERD (gastroesophageal reflux disease)   . Hypertension     Patient Active Problem List   Diagnosis Date Noted  . UTI (urinary tract infection) 08/11/2014  . CKD (chronic kidney disease) stage 2, GFR 60-89 ml/min 03/11/2014  . Dementia with behavioral disturbance 11/18/2013  . FTT (failure to thrive) in adult 06/03/2013  . Chronic systolic CHF (congestive heart failure) (HCC) 05/16/2013  . Anemia 05/05/2013  . Dysphagia 05/03/2013  . GERD (gastroesophageal reflux disease)   . Hypertensive heart disease with congestive heart failure (HCC)   . BPH (benign prostatic hyperplasia)   . Diabetes mellitus type 2, controlled, without complications (HCC)   . Constipation     Past Surgical History:  Procedure Laterality Date  . HERNIA REPAIR    . HIP SURGERY         Home Medications    Prior to Admission medications   Medication Sig Start Date End Date Taking? Authorizing Provider  acetaminophen  (TYLENOL) 500 MG tablet Take 1,000 mg by mouth every 6 (six) hours as needed for mild pain.     Historical Provider, MD  AMBULATORY NON FORMULARY MEDICATION Magic cup three times daily    Historical Provider, MD  aspirin 81 MG tablet Take 81 mg by mouth daily.    Historical Provider, MD  beta carotene w/minerals (OCUVITE) tablet Take 1 tablet by mouth daily. For vision    Historical Provider, MD  bisacodyl (DULCOLAX) 10 MG suppository Place 10 mg rectally daily. If constipation is not relieved by by MOM give 10 mg Bisacodyl suppository rectally x 1 dose in 24 hours as needed    Historical Provider, MD  carvedilol (COREG) 3.125 MG tablet Take 3.125 mg by mouth 2 (two) times daily with a meal.    Historical Provider, MD  Emollient (GOLD BOND ULTIMATE) LOTN Apply topically. Apply to back and chest daily    Historical Provider, MD  ferrous sulfate 325 (65 FE) MG tablet Take 325 mg by mouth 2 (two) times daily with a meal.    Historical Provider, MD  finasteride (PROSCAR) 5 MG tablet Take 5 mg by mouth daily.    Historical Provider, MD  fluocinonide cream (LIDEX) 0.05 % Apply to lower legs twice daily on Mon, Tues, Wed, and Thurs    Historical Provider, MD  hydrOXYzine (ATARAX/VISTARIL) 25 MG tablet Take 25 mg by mouth every 6 (six) hours as needed for itching.  Historical Provider, MD  ipratropium-albuterol (DUONEB) 0.5-2.5 (3) MG/3ML SOLN Take 3 mLs by nebulization every 6 (six) hours as needed (as needed for cough).    Historical Provider, MD  lisinopril (PRINIVIL,ZESTRIL) 5 MG tablet 1/2 tablet by mouth daily    Historical Provider, MD  Melatonin 3 MG TABS Take 1 tablet by mouth at bedtime.    Historical Provider, MD  mirtazapine (REMERON) 15 MG tablet Take 15 mg by mouth at bedtime. 05/14/13   Leroy Sea, MD  NON FORMULARY Med Pass 120 ml by mouth daily for supplement    Historical Provider, MD  nystatin (MYCOSTATIN/NYSTOP) 100000 UNIT/GM POWD Apply to groin and peri area twice a day as  needed.    Historical Provider, MD  omeprazole (PRILOSEC) 20 MG capsule Take 20 mg by mouth daily.    Historical Provider, MD  OXYGEN Inhale into the lungs. O2 at 4 liters per mask as neededto maintain O2 stats 90% or greater    Historical Provider, MD  polyvinyl alcohol (LIQUIFILM TEARS) 1.4 % ophthalmic solution Place 1 drop into both eyes 3 (three) times daily. For dry eyes    Historical Provider, MD  tamsulosin (FLOMAX) 0.4 MG CAPS capsule Take 0.4 mg by mouth daily after supper.     Historical Provider, MD  vitamin B-12 (CYANOCOBALAMIN) 1000 MCG tablet Take 1,000 mcg by mouth daily. For supplement    Historical Provider, MD    Family History Family History  Problem Relation Age of Onset  . Diabetes Mellitus II Mother     Social History Social History  Substance Use Topics  . Smoking status: Former Games developer  . Smokeless tobacco: Never Used  . Alcohol use No     Comment: occasionally has not had for years.     Allergies   Ativan [lorazepam]   Review of Systems Review of Systems  Unable to perform ROS: Dementia    Physical Exam Updated Vital Signs BP (!) 144/51 (BP Location: Right Arm)   Pulse 78   Temp 97.9 F (36.6 C) (Oral)   Resp 18   Ht 5\' 4"  (1.626 m)   Wt 146 lb (66.2 kg)   SpO2 98%   BMI 25.06 kg/m   Physical Exam  Constitutional: He appears well-developed and well-nourished. No distress.  HENT:  Head: Normocephalic.  Right Ear: Hearing normal.  Left Ear: Hearing normal.  Nose: Nose normal.  Mouth/Throat: Oropharynx is clear and moist and mucous membranes are normal.  Eyes: Conjunctivae and EOM are normal. Pupils are equal, round, and reactive to light.  Neck: Normal range of motion. Neck supple.  Cardiovascular: Regular rhythm, S1 normal and S2 normal.  Exam reveals no gallop and no friction rub.   No murmur heard. Pulmonary/Chest: Effort normal and breath sounds normal. No respiratory distress. He exhibits no tenderness.  Abdominal: Soft. Normal  appearance and bowel sounds are normal. There is no hepatosplenomegaly. There is no tenderness. There is no rebound, no guarding, no tenderness at McBurney's point and negative Murphy's sign. No hernia.  Musculoskeletal: He exhibits tenderness.  Bruising to proximal phalanx of 3rd and 4th fingers with some swelling  Diffuse lumbar tenderness  Neurological: He is alert. He has normal strength. No cranial nerve deficit or sensory deficit. Coordination normal. GCS eye subscore is 4. GCS verbal subscore is 5. GCS motor subscore is 6.  Skin: Skin is warm, dry and intact. No rash noted. No cyanosis.  Nursing note and vitals reviewed.    ED Treatments / Results  DIAGNOSTIC STUDIES:  Oxygen Saturation is 94% on RA, adequate by my interpretation.    Labs (all labs ordered are listed, but only abnormal results are displayed) Labs Reviewed - No data to display  EKG  EKG Interpretation None       Radiology Dg Thoracic Spine W/swimmers  Result Date: 05/12/2016 CLINICAL DATA:  Back pain after a fall tonight. EXAM: THORACIC SPINE - 3 VIEWS COMPARISON:  05/08/2013 FINDINGS: Diffuse bone demineralization. Diffuse degenerative change throughout the thoracic spine with narrowed interspaces and endplate hypertrophic changes. Normal alignment. No vertebral compression deformities. No focal bone lesion or bone destruction. No paraspinal soft tissue swelling. Aortic calcifications. IMPRESSION: Diffuse demineralization and degenerative changes throughout the thoracic spine. No acute displaced fractures are demonstrated. Aortic atherosclerosis. Electronically Signed   By: Burman Nieves M.D.   On: 05/12/2016 02:03   Dg Lumbar Spine Complete  Result Date: 05/12/2016 CLINICAL DATA:  Fall tonight.  Back pain and right hand pain. EXAM: LUMBAR SPINE - COMPLETE 4+ VIEW COMPARISON:  MRI lumbar spine 05/11/2013 FINDINGS: Diffuse bone demineralization. Slight anterior subluxation of L4 on L5, unchanged since  previous study. Degenerative changes throughout the lumbar spine with narrowed interspaces and endplate hypertrophic changes. No vertebral compression deformities. No focal bone lesion or bone destruction. Visualized sacrum appears intact. Previous right hip arthroplasty. Calcifications in the pelvis likely to represent bladder stones. Aortic atherosclerosis. IMPRESSION: No acute bony abnormalities. Degenerative changes in the lumbar spine. Calcifications in the pelvis likely represent bladder stones. Electronically Signed   By: Burman Nieves M.D.   On: 05/12/2016 02:00   Dg Pelvis 1-2 Views  Result Date: 05/12/2016 CLINICAL DATA:  Back pain after a fall tonight. EXAM: PELVIS - 1-2 VIEW COMPARISON:  None. FINDINGS: Previous left hip arthroplasty. Component is incompletely included on the view. Pelvis appears intact. No evidence of acute fracture or dislocation. Degenerative changes in the lower lumbar spine and in the left hip. Left hip degenerative changes result in bone-on-bone phenomenon in the superior and inferior acetabular joint spaces with small osteophyte formation. Diffuse bone demineralization. Visualized sacrum appears intact. SI joints and symphysis pubis are not displaced. Vascular calcifications. Calcifications in the pelvis likely represent bladder stones. IMPRESSION: No acute bony abnormalities. Diffuse bone demineralization. Right hip arthroplasty. Degenerative changes in the left hip. Probable bladder stones. Electronically Signed   By: Burman Nieves M.D.   On: 05/12/2016 02:01   Dg Hand Complete Right  Result Date: 05/12/2016 CLINICAL DATA:  Larey Seat tonight.  RIGHT hand pain. EXAM: RIGHT HAND - COMPLETE 3+ VIEW COMPARISON:  None. FINDINGS: Linear lucency through base of third proximal phalanx without intra-articular extension. No dislocation. Joint spaces intact without erosions. Osteopenia. No destructive bony lesions. Soft tissue planes are not suspicious. IMPRESSION: Acute  nondisplaced fracture base of third proximal phalanx. No dislocation. Electronically Signed   By: Awilda Metro M.D.   On: 05/12/2016 02:01    Procedures Procedures (including critical care time)  Medications Ordered in ED Medications - No data to display   Initial Impression / Assessment and Plan / ED Course  I have reviewed the triage vital signs and the nursing notes.  Pertinent labs & imaging results that were available during my care of the patient were reviewed by me and considered in my medical decision making (see chart for details).     Patient sent to the emergency department for evaluation of finger fracture. Patient reportedly fell a week ago at the nursing home. X-rays obtained today reportedly showed fracture  of the finger. Examination revealed ecchymosis and swelling of second third and fourth fingers. Patient also complaining of back pain. X-ray of thoracic spine, lumbar spine and pelvis was obtained. No acute fractures noted. Hand x-ray does confirm, fracture at the base of the third finger. This will be treated with splint, can be followed up as an outpatient.  Final Clinical Impressions(s) / ED Diagnoses   Final diagnoses:  Closed nondisplaced fracture of proximal phalanx of right middle finger, initial encounter    New Prescriptions New Prescriptions   No medications on file   I personally performed the services described in this documentation, which was scribed in my presence. The recorded information has been reviewed and is accurate.     Gilda Crease, MD 05/12/16 [id number]    Gilda Crease, MD 05/12/16 [phone]

## 2016-05-12 NOTE — ED Notes (Signed)
[facility]

## 2016-05-23 ENCOUNTER — Other Ambulatory Visit (HOSPITAL_COMMUNITY): Payer: Self-pay | Admitting: Internal Medicine

## 2016-05-23 DIAGNOSIS — R1310 Dysphagia, unspecified: Secondary | ICD-10-CM

## 2016-05-25 ENCOUNTER — Ambulatory Visit (HOSPITAL_COMMUNITY)
Admission: RE | Admit: 2016-05-25 | Discharge: 2016-05-25 | Disposition: A | Discharge status: Home / Self Care | Payer: Medicare Other | Source: Ambulatory Visit | Attending: Internal Medicine | Admitting: Internal Medicine

## 2016-05-25 DIAGNOSIS — R1310 Dysphagia, unspecified: Secondary | ICD-10-CM | POA: Diagnosis present

## 2016-05-25 DIAGNOSIS — R1312 Dysphagia, oropharyngeal phase: Secondary | ICD-10-CM | POA: Diagnosis not present

## 2016-06-01 ENCOUNTER — Encounter: Payer: Self-pay | Admitting: Nurse Practitioner

## 2016-06-01 ENCOUNTER — Non-Acute Institutional Stay (SKILLED_NURSING_FACILITY): Payer: Medicare Other | Admitting: Nurse Practitioner

## 2016-06-01 DIAGNOSIS — I110 Hypertensive heart disease with heart failure: Secondary | ICD-10-CM

## 2016-06-01 DIAGNOSIS — G301 Alzheimer's disease with late onset: Secondary | ICD-10-CM | POA: Diagnosis not present

## 2016-06-01 DIAGNOSIS — R627 Adult failure to thrive: Secondary | ICD-10-CM | POA: Diagnosis not present

## 2016-06-01 DIAGNOSIS — F0281 Dementia in other diseases classified elsewhere with behavioral disturbance: Secondary | ICD-10-CM | POA: Diagnosis not present

## 2016-06-01 DIAGNOSIS — R1312 Dysphagia, oropharyngeal phase: Secondary | ICD-10-CM | POA: Diagnosis not present

## 2016-06-01 DIAGNOSIS — R5383 Other fatigue: Secondary | ICD-10-CM

## 2016-06-01 NOTE — Progress Notes (Signed)
Patient ID: Riley Martinez, male   DOB: 1914-07-04, 83 y.o.   MRN: [id number]    Nursing Home Location:  [facility] Room: 204 B  Place of Service: SNF (31)  PCP: Marga Melnick, MD   Code Status: DNR  Allergies  Allergen Reactions  . Ativan [Lorazepam] Other (See Comments)    unknown    Chief Complaint  Patient presents with  . Medical Management of Chronic Issues    Resident is being seen for routine visit    HPI:  Patient is a 83 y.o. male seen today at [facility] for routine follow up on chronic conditions. Pt with a PMH of dementia, HTN, BPH (s/p TURP) GERD, CHF. Unable to obtain thorough ROS due to dementia and lethargy this AM. Noted that pt nighttime sleep aids, including Remeron and melatonin, have been changed to dose times of 10pm per patient request. Most likely his sleepiness is related to progressing dementia and FTT. He answers questions appropriately for his baseline normal and states that he feels "fine" and is not in any pain. No new behaviors noted. Weight has trended up two pounds since 2/15- now 148lb. Pt conts on remeron and supplements. No changes noted to bowel or bladder per patient. Briefs in place. No pain noted.  Speech therapy also following him for dysphagia.   Review of Systems:  Review of Systems  Unable to perform ROS: Dementia    Past Medical History:  Diagnosis Date  . BPH (benign prostatic hyperplasia)   . Constipation   . Diabetes mellitus without complication (HCC)   . GERD (gastroesophageal reflux disease)   . Hypertension    Past Surgical History:  Procedure Laterality Date  . HERNIA REPAIR    . HIP SURGERY     Social History:   reports that he has quit smoking. He has never used smokeless tobacco. He reports that he does not drink alcohol or use drugs.  Family History  Problem Relation Age of Onset  . Diabetes Mellitus II Mother     Medications: Patient's Medications  New Prescriptions   No medications on  file  Previous Medications   ACETAMINOPHEN (TYLENOL) 500 MG TABLET    Take 1,000 mg by mouth every 6 (six) hours as needed for mild pain.    AMBULATORY NON FORMULARY MEDICATION    Magic cup three times daily   ASPIRIN 81 MG TABLET    Take 81 mg by mouth daily.   BETA CAROTENE W/MINERALS (OCUVITE) TABLET    Take 1 tablet by mouth daily. For vision   BISACODYL (DULCOLAX) 10 MG SUPPOSITORY    Place 10 mg rectally daily. If constipation is not relieved by by MOM give 10 mg Bisacodyl suppository rectally x 1 dose in 24 hours as needed   CARVEDILOL (COREG) 3.125 MG TABLET    Take 3.125 mg by mouth 2 (two) times daily with a meal.   EMOLLIENT (GOLD BOND ULTIMATE) LOTN    Apply topically. Apply to back and chest daily   FERROUS SULFATE 325 (65 FE) MG TABLET    Take 325 mg by mouth 2 (two) times daily with a meal.   FINASTERIDE (PROSCAR) 5 MG TABLET    Take 5 mg by mouth daily.   FLUOCINONIDE CREAM (LIDEX) 0.05 %    Apply to lower legs twice daily on Mon, Tues, Wed, and Thurs   IPRATROPIUM-ALBUTEROL (DUONEB) 0.5-2.5 (3) MG/3ML SOLN    Take 3 mLs by nebulization every 6 (six) hours as needed (  as needed for cough).   LISINOPRIL (PRINIVIL,ZESTRIL) 5 MG TABLET    1/2 tablet by mouth daily   MELATONIN 3 MG TABS    Take 1 tablet by mouth at bedtime.   MIRTAZAPINE (REMERON) 15 MG TABLET    Take 15 mg by mouth at bedtime.   NON FORMULARY    Med Pass 120 ml by mouth daily for supplement   NYSTATIN (MYCOSTATIN/NYSTOP) 100000 UNIT/GM POWD    Apply to groin and peri area twice a day as needed.   OMEPRAZOLE (PRILOSEC) 20 MG CAPSULE    Take 20 mg by mouth daily.   OXYGEN    Inhale into the lungs. O2 at 4 liters per mask as neededto maintain O2 stats 90% or greater   POLYVINYL ALCOHOL (LIQUIFILM TEARS) 1.4 % OPHTHALMIC SOLUTION    Place 1 drop into both eyes 3 (three) times daily. For dry eyes   TAMSULOSIN (FLOMAX) 0.4 MG CAPS CAPSULE    Take 0.4 mg by mouth daily after supper.    VITAMIN B-12 (CYANOCOBALAMIN) 1000  MCG TABLET    Take 1,000 mcg by mouth daily. For supplement  Modified Medications   No medications on file  Discontinued Medications   HYDROXYZINE (ATARAX/VISTARIL) 25 MG TABLET    Take 25 mg by mouth every 6 (six) hours as needed for itching.     Physical Exam: Vitals:   06/01/16 1019  BP: 127/66  Pulse: 76  Resp: 16  Temp: 97 F (36.1 C)  SpO2: 95%  Weight: 148 lb (67.1 kg)  Height: 5\' 4"  (1.626 m)     Physical Exam  Constitutional: Vital signs are normal. He appears lethargic. No distress.  Elderly, frail male NAD  HENT:  Head: Normocephalic and atraumatic.  Mouth/Throat: Oropharynx is clear and moist. No oropharyngeal exudate.  Eyes: Conjunctivae and EOM are normal. Pupils are equal, round, and reactive to light.  Neck: Normal range of motion. Neck supple.  Cardiovascular: Normal rate, regular rhythm and normal heart sounds.   Pulses:      Radial pulses are 2+ on the right side, and 2+ on the left side.       Posterior tibial pulses are 1+ on the right side, and 1+ on the left side.  Pulmonary/Chest: Effort normal. No tachypnea. No respiratory distress.  Abdominal: Soft. Bowel sounds are normal. He exhibits no distension. There is no tenderness. There is no guarding.  Musculoskeletal: He exhibits no edema or tenderness.  Neurological: He appears lethargic.  Lethargic, but appropriate for pt baseline norm  Skin: Skin is warm. He is not diaphoretic.  Psychiatric: Cognition and memory are impaired. He exhibits abnormal recent memory and abnormal remote memory.    Labs reviewed: Basic Metabolic Panel:  Recent Labs  16/12/9603/24/17 10/05/15 10/31/15  NA 140 145 144  K 4.4 3.7 3.8  BUN 18 23* 24*  CREATININE 0.7 0.8 0.6   Liver Function Tests:  Recent Labs  08/19/15  AST 14  ALT 11  ALKPHOS 44   No results for input(s): LIPASE, AMYLASE in the last 8760 hours. No results for input(s): AMMONIA in the last 8760 hours. CBC:  Recent Labs  10/30/15 10/31/15 01/14/16  01/15/16  WBC 6.0 4.3 7.0 6.4  NEUTROABS 3,660 2,623  --  4,480  HGB 9.6* 9.0* 12.0* 9.1*  HCT 31* 29* 37* 29*  PLT 129* 148* 182 173   TSH: No results for input(s): TSH in the last 8760 hours. A1C: Lab Results  Component Value Date  HGBA1C 5.6 12/21/2015   Lipid Panel: No results for input(s): CHOL, HDL, LDLCALC, TRIG, CHOLHDL, LDLDIRECT in the last 8760 hours.  Assessment/Plan 1. Late onset Alzheimer's disease with behavioral disturbance -Advanced dementia without acute changes in the last month, slow decline anticipated and noted.  2. Hypertensive heart disease with congestive heart failure (HCC) -Stable, euvolemic.  -BP's stable today and last three readings- 127/66, 143/55, 122/74 -Continue pt on coreg -BMET  3. FTT (failure to thrive) in adult -Pt up 2 lbs today-148lb, from 146lb.  -Continue current regimen of remeron and supplements.  -Will expect some weight decreases as cognitive function continues to decline.   4. Lethargy -FTT and progressing dementia are most likely contributing as stated above, appears to be at baseline. Question if pt has sleep/wake cycle off, encourage staff to get OOB during the day.  -Will continue to monitor for additional changes and see pt in one month, sooner if needed  5. dysphagia ST has been working with pt, diet has been changed to puree with nectar thick liquids   Brihany Butch K. Biagio Borg  [facility] & Adult Medicine [phone] 8 am - 5 pm) [phone] (after hours)

## 2016-06-22 ENCOUNTER — Ambulatory Visit (HOSPITAL_COMMUNITY): Payer: Medicare Other

## 2016-06-29 ENCOUNTER — Non-Acute Institutional Stay (SKILLED_NURSING_FACILITY): Payer: Medicare Other | Admitting: Nurse Practitioner

## 2016-06-29 ENCOUNTER — Encounter: Payer: Self-pay | Admitting: Nurse Practitioner

## 2016-06-29 DIAGNOSIS — G301 Alzheimer's disease with late onset: Secondary | ICD-10-CM | POA: Diagnosis not present

## 2016-06-29 DIAGNOSIS — F02818 Dementia in other diseases classified elsewhere, unspecified severity, with other behavioral disturbance: Secondary | ICD-10-CM

## 2016-06-29 DIAGNOSIS — R5383 Other fatigue: Secondary | ICD-10-CM

## 2016-06-29 DIAGNOSIS — R627 Adult failure to thrive: Secondary | ICD-10-CM

## 2016-06-29 DIAGNOSIS — F0281 Dementia in other diseases classified elsewhere with behavioral disturbance: Secondary | ICD-10-CM

## 2016-06-29 DIAGNOSIS — I110 Hypertensive heart disease with heart failure: Secondary | ICD-10-CM | POA: Diagnosis not present

## 2016-06-29 NOTE — Progress Notes (Signed)
Patient ID: Riley Martinez, male   DOB: December 31, 1914, 83 y.o.   MRN: [id number]    Nursing Home Location:  [facility] Room: 204 B  Place of Service: SNF (31)  PCP: Marga Melnick, MD   Code Status: DNR  Allergies  Allergen Reactions  . Ativan [Lorazepam] Other (See Comments)    unknown    Chief Complaint  Patient presents with  . Medical Management of Chronic Issues    Resident is being seen for a routine visit.     HPI:  Patient is a 83 y.o. male seen today at [facility] for routine follow up on chronic conditions. Pt with a PMH of dementia, HTN, BPH (s/p TURP), GERD, and CHF. Unable to obtain thorough ROS due to dementia and lethargy this PM. Most likely his sleepiness is related to progressing dementia and FTT, was up watching TV earlier in the day. He is unable to answer questions appropriately for his baseline normal, however states that he feels "fine" and is not in any pain. No new behaviors noted. Weight in March had trended up two pounds since 2/15 to 148lb, no recent weight at this time. Pt continues on remeron and supplements. No changes noted to bowel or bladder per patient. Briefs in place. No pain noted.  Speech therapy also following him for dysphagia.   Review of Systems:  Review of Systems  Unable to perform ROS: Dementia    Past Medical History:  Diagnosis Date  . BPH (benign prostatic hyperplasia)   . Constipation   . Diabetes mellitus without complication (HCC)   . GERD (gastroesophageal reflux disease)   . Hypertension    Past Surgical History:  Procedure Laterality Date  . HERNIA REPAIR    . HIP SURGERY     Social History:   reports that he has quit smoking. He has never used smokeless tobacco. He reports that he does not drink alcohol or use drugs.  Family History  Problem Relation Age of Onset  . Diabetes Mellitus II Mother     Medications: Patient's Medications  New Prescriptions   No medications on file  Previous  Medications   ACETAMINOPHEN (TYLENOL) 500 MG TABLET    Take 1,000 mg by mouth every 6 (six) hours as needed for mild pain.    AMBULATORY NON FORMULARY MEDICATION    Magic cup three times daily   ASPIRIN 81 MG TABLET    Take 81 mg by mouth daily.   BETA CAROTENE W/MINERALS (OCUVITE) TABLET    Take 1 tablet by mouth daily. For vision   BISACODYL (DULCOLAX) 10 MG SUPPOSITORY    Place 10 mg rectally daily. If constipation is not relieved by by MOM give 10 mg Bisacodyl suppository rectally x 1 dose in 24 hours as needed   CARVEDILOL (COREG) 3.125 MG TABLET    Take 3.125 mg by mouth 2 (two) times daily with a meal.   EMOLLIENT (GOLD BOND ULTIMATE) LOTN    Apply topically. Apply to back and chest daily   FERROUS SULFATE 325 (65 FE) MG TABLET    Take 325 mg by mouth 2 (two) times daily with a meal.   FINASTERIDE (PROSCAR) 5 MG TABLET    Take 5 mg by mouth daily.   FLUOCINONIDE CREAM (LIDEX) 0.05 %    Apply to lower legs twice daily on Mon, Tues, Wed, and Thurs   IPRATROPIUM-ALBUTEROL (DUONEB) 0.5-2.5 (3) MG/3ML SOLN    Take 3 mLs by nebulization every 6 (six) hours as  needed (as needed for cough).   LISINOPRIL (PRINIVIL,ZESTRIL) 5 MG TABLET    1/2 tablet by mouth daily   MELATONIN 3 MG TABS    Take 1 tablet by mouth at bedtime.   MIRTAZAPINE (REMERON) 15 MG TABLET    Take 15 mg by mouth at bedtime.   NON FORMULARY    Med Pass 120 ml by mouth daily for supplement   NYSTATIN (MYCOSTATIN/NYSTOP) 100000 UNIT/GM POWD    Apply to groin and peri area twice a day as needed.   OMEPRAZOLE (PRILOSEC) 20 MG CAPSULE    Take 20 mg by mouth daily.   OXYGEN    Inhale into the lungs. O2 at 4 liters per mask as neededto maintain O2 stats 90% or greater   POLYVINYL ALCOHOL (LIQUIFILM TEARS) 1.4 % OPHTHALMIC SOLUTION    Place 1 drop into both eyes 3 (three) times daily. For dry eyes   TAMSULOSIN (FLOMAX) 0.4 MG CAPS CAPSULE    Take 0.4 mg by mouth daily after supper.    VITAMIN B-12 (CYANOCOBALAMIN) 1000 MCG TABLET    Take  1,000 mcg by mouth daily. For supplement  Modified Medications   No medications on file  Discontinued Medications   No medications on file     Physical Exam: Vitals:   06/29/16 1548  BP: (!) 110/58  Pulse: 68  Resp: 16  Temp: 98 F (36.7 C)  SpO2: 91%  Height:  (1.626 m)     Physical Exam  Constitutional: Vital signs are normal. He appears lethargic. He is sleeping. He has a sickly appearance. No distress.  Elderly, frail male NAD  HENT:  Head: Normocephalic and atraumatic.  Mouth/Throat: Oropharynx is clear and moist. No oropharyngeal exudate.  Eyes: Conjunctivae and EOM are normal. Pupils are equal, round, and reactive to light. Right eye exhibits no discharge. Left eye exhibits no discharge.  Neck: Normal range of motion. Neck supple.  Cardiovascular: Normal rate, regular rhythm and normal heart sounds.   Pulses:      Radial pulses are 2+ on the right side, and 2+ on the left side.       Posterior tibial pulses are 1+ on the right side, and 1+ on the left side.  Pulmonary/Chest: Effort normal. No tachypnea. No respiratory distress. He has decreased breath sounds in the right upper field, the right middle field, the left upper field and the left middle field.  Barrel chested   Abdominal: Soft. Bowel sounds are normal. He exhibits no distension. There is no tenderness. There is no guarding.  Musculoskeletal: He exhibits no edema or tenderness.  Neurological: He appears lethargic.  Lethargic, unable to answer questions appropriately.   Skin: Skin is warm. He is not diaphoretic. No erythema.  Psychiatric: Cognition and memory are impaired. He exhibits abnormal recent memory and abnormal remote memory.    Labs reviewed: Basic Metabolic Panel:  Recent Labs  56/21/30 10/05/15 10/31/15  NA 140 145 144  K 4.4 3.7 3.8  BUN 18 23* 24*  CREATININE 0.7 0.8 0.6   Liver Function Tests:  Recent Labs  08/19/15  AST 14  ALT 11  ALKPHOS 44   No results for input(s):  LIPASE, AMYLASE in the last 8760 hours. No results for input(s): AMMONIA in the last 8760 hours. CBC:  Recent Labs  10/30/15 10/31/15 01/14/16 01/15/16  WBC 6.0 4.3 7.0 6.4  NEUTROABS 3,660 2,623  --  4,480  HGB 9.6* 9.0* 12.0* 9.1*  HCT 31* 29* 37* 29*  PLT 129* 148* 182 173   TSH: No results for input(s): TSH in the last 8760 hours. A1C: Lab Results  Component Value Date   HGBA1C 5.6 12/21/2015   Lipid Panel: No results for input(s): CHOL, HDL, LDLCALC, TRIG, CHOLHDL, LDLDIRECT in the last 8760 hours.  Assessment/Plan  1. Hypertensive heart disease with congestive heart failure (HCC) -Stable, euvolemic.  -BP's stable today and last two readings- 110/58 and 127/66 -Continue pt on coreg and lisinopril. Will follow up labs.   2. FTT (failure to thrive) in adult -Weight pending in chart, staff to get.  -Continue current regimen of remeron and supplements.  -Will expect some weight decreases as cognitive function continues to decline.  -will get CMP   3. Lethargy -FTT and progressing dementia are most likely contributing as stated above, appears to be at baseline. Question if pt has sleep/wake cycle off, encourage staff to get OOB during the day.  -Will continue to monitor for additional changes and see pt in one month, sooner if needed -will follow up TSH, CBC  4. Late onset Alzheimer's disease with behavioral disturbance -Advanced dementia without acute changes in the last month, slow decline anticipated and noted.   Janene Harvey. Biagio Borg  [facility] & Adult Medicine [phone] 8 am - 5 pm) [phone] (after hours)

## 2016-06-30 LAB — BASIC METABOLIC PANEL WITH GFR
BUN: 24 mg/dL — AB (ref 4–21)
Creatinine: 0.6 mg/dL (ref 0.6–1.3)
Glucose: 106 mg/dL
Potassium: 4.3 mmol/L (ref 3.4–5.3)
Sodium: 143 mmol/L (ref 137–147)

## 2016-06-30 LAB — CBC AND DIFFERENTIAL
HCT: 32 % — AB (ref 41–53)
HCT: 32 % — AB (ref 41–53)
Hemoglobin: 9.4 g/dL — AB (ref 13.5–17.5)
Hemoglobin: 9.4 g/dL — AB (ref 13.5–17.5)
Platelets: 155 K/µL (ref 150–399)
Platelets: 155 K/µL (ref 150–399)
WBC: 4.8 10*3/mL
WBC: 4.8 10*3/mL

## 2016-06-30 LAB — HEPATIC FUNCTION PANEL
ALK PHOS: 43 U/L (ref 25–125)
ALT: 15 U/L (ref 10–40)
AST: 19 U/L (ref 14–40)

## 2016-06-30 LAB — TSH: TSH: 2.63 u[IU]/mL (ref 0.41–5.90)

## 2016-07-06 ENCOUNTER — Non-Acute Institutional Stay (SKILLED_NURSING_FACILITY): Payer: Medicare Other | Admitting: Nurse Practitioner

## 2016-07-06 DIAGNOSIS — F0281 Dementia in other diseases classified elsewhere with behavioral disturbance: Secondary | ICD-10-CM

## 2016-07-06 DIAGNOSIS — R059 Cough, unspecified: Secondary | ICD-10-CM

## 2016-07-06 DIAGNOSIS — G301 Alzheimer's disease with late onset: Secondary | ICD-10-CM | POA: Diagnosis not present

## 2016-07-06 DIAGNOSIS — N3000 Acute cystitis without hematuria: Secondary | ICD-10-CM | POA: Diagnosis not present

## 2016-07-06 DIAGNOSIS — R627 Adult failure to thrive: Secondary | ICD-10-CM | POA: Diagnosis not present

## 2016-07-06 DIAGNOSIS — R05 Cough: Secondary | ICD-10-CM

## 2016-07-06 DIAGNOSIS — R1310 Dysphagia, unspecified: Secondary | ICD-10-CM | POA: Diagnosis not present

## 2016-07-06 NOTE — Progress Notes (Signed)
Patient ID: Riley Martinez, male   DOB: [birth year], 83 y.o.   MRN: [id number]    Nursing Home Location:  [facility] Room: 204 B  Place of Service: SNF (31)  PCP: Marga Melnick, MD   Code Status: DNR  Allergies  Allergen Reactions  . Ativan [Lorazepam] Other (See Comments)    unknown    Chief Complaint  Patient presents with  . Acute Visit    concerns for allergies    HPI:  Patient is a 83 y.o. male seen today at [facility] for an acute visit regarding son's concern for allergy chest congestion, cough, and possible UTI. PMH of dementia, HTN, BPH (s/p TURP), GERD, and CHF. Pt verbally states that he is "doing okay" today. Unable to obtain thorough ROS due to his dementia.  Pt does report having burning with urination when questioned. He is incontinent of urine and wears briefs. He was last seen 06/29/16 in the afternoon when he was drowsy; today he seems more attentive and alert. Cough present upon entering pt room. Nursing staff at bedside stating he is not producing much sputum with this and is unable to forcefully clear his upper airway. ST has seen pt for dysphasia as well.   Unable to obtain weight on encounter day 4/4, however this information is now available and he has lost approximately 15lbs over the last 1 month despite the use of remeron and supplements. This morning he was taking fluids in, but was not consuming much else with the assistance of staff feeding him.   Review of Systems:  Review of Systems  Unable to perform ROS: Dementia    Past Medical History:  Diagnosis Date  . BPH (benign prostatic hyperplasia)   . Constipation   . Diabetes mellitus without complication (HCC)   . GERD (gastroesophageal reflux disease)   . Hypertension    Past Surgical History:  Procedure Laterality Date  . HERNIA REPAIR    . HIP SURGERY     Social History:   reports that he has quit smoking. He has never used smokeless tobacco. He reports that he does not  drink alcohol or use drugs.  Family History  Problem Relation Age of Onset  . Diabetes Mellitus II Mother     Medications: Patient's Medications  New Prescriptions   No medications on file  Previous Medications   ACETAMINOPHEN (TYLENOL) 500 MG TABLET    Take 1,000 mg by mouth every 6 (six) hours as needed for mild pain.    AMBULATORY NON FORMULARY MEDICATION    Magic cup three times daily   ASPIRIN 81 MG TABLET    Take 81 mg by mouth daily.   BETA CAROTENE W/MINERALS (OCUVITE) TABLET    Take 1 tablet by mouth daily. For vision   BISACODYL (DULCOLAX) 10 MG SUPPOSITORY    Place 10 mg rectally daily. If constipation is not relieved by by MOM give 10 mg Bisacodyl suppository rectally x 1 dose in 24 hours as needed   CARVEDILOL (COREG) 3.125 MG TABLET    Take 3.125 mg by mouth 2 (two) times daily with a meal.   EMOLLIENT (GOLD BOND ULTIMATE) LOTN    Apply topically. Apply to back and chest daily   FERROUS SULFATE 325 (65 FE) MG TABLET    Take 325 mg by mouth 2 (two) times daily with a meal.   FINASTERIDE (PROSCAR) 5 MG TABLET    Take 5 mg by mouth daily.   FLUOCINONIDE CREAM (LIDEX) 0.05 %  Apply to lower legs twice daily on Mon, Tues, Wed, and Thurs   IPRATROPIUM-ALBUTEROL (DUONEB) 0.5-2.5 (3) MG/3ML SOLN    Take 3 mLs by nebulization every 6 (six) hours as needed (as needed for cough).   LISINOPRIL (PRINIVIL,ZESTRIL) 5 MG TABLET    1/2 tablet by mouth daily   MELATONIN 3 MG TABS    Take 1 tablet by mouth at bedtime.   MIRTAZAPINE (REMERON) 15 MG TABLET    Take 15 mg by mouth at bedtime.   NON FORMULARY    Med Pass 120 ml by mouth daily for supplement   NYSTATIN (MYCOSTATIN/NYSTOP) 100000 UNIT/GM POWD    Apply to groin and peri area twice a day as needed.   OMEPRAZOLE (PRILOSEC) 20 MG CAPSULE    Take 20 mg by mouth daily.   OXYGEN    Inhale into the lungs. O2 at 4 liters per mask as neededto maintain O2 stats 90% or greater   POLYVINYL ALCOHOL (LIQUIFILM TEARS) 1.4 % OPHTHALMIC SOLUTION     Place 1 drop into both eyes 3 (three) times daily. For dry eyes   TAMSULOSIN (FLOMAX) 0.4 MG CAPS CAPSULE    Take 0.4 mg by mouth daily after supper.    VITAMIN B-12 (CYANOCOBALAMIN) 1000 MCG TABLET    Take 1,000 mcg by mouth daily. For supplement  Modified Medications   No medications on file  Discontinued Medications   No medications on file     Physical Exam: Vitals:   07/06/16 0921  BP: (!) 100/46  Pulse: (!) 50  Temp: 97.7 F (36.5 C)  SpO2: 94%  Weight: 131 lb 12.8 oz (59.8 kg)     Physical Exam  Constitutional: Vital signs are normal. He is cooperative. He has a sickly appearance. No distress.  Elderly, frail male NAD,   HENT:  Head: Normocephalic and atraumatic.  Mouth/Throat: Oropharynx is clear and moist. No oropharyngeal exudate.  Eyes: Conjunctivae and EOM are normal. Pupils are equal, round, and reactive to light. Right eye exhibits no discharge. Left eye exhibits no discharge.  Neck: Normal range of motion. Neck supple.  Cardiovascular: Normal rate, regular rhythm and normal heart sounds.   Pulses:      Radial pulses are 2+ on the right side, and 2+ on the left side.       Posterior tibial pulses are 1+ on the right side, and 1+ on the left side.  Pulmonary/Chest: Effort normal. No tachypnea. No respiratory distress. He has decreased breath sounds in the right lower field and the left lower field. He has rhonchi in the right upper field and the left upper field. He exhibits no tenderness.  Barrel chested; rhonchi present in anterior upper airway.   Abdominal: Soft. Bowel sounds are normal. He exhibits no distension. There is no tenderness. There is no guarding.  Musculoskeletal: He exhibits no edema or tenderness.  Lymphadenopathy:    He has no cervical adenopathy.  Skin: Skin is warm. He is not diaphoretic. No erythema.  Psychiatric: Cognition and memory are impaired. He exhibits abnormal recent memory and abnormal remote memory.    Labs reviewed: Basic  Metabolic Panel:  Recent Labs  16/10/96 10/05/15 10/31/15  NA 140 145 144  K 4.4 3.7 3.8  BUN 18 23* 24*  CREATININE 0.7 0.8 0.6   Liver Function Tests:  Recent Labs  08/19/15  AST 14  ALT 11  ALKPHOS 44   No results for input(s): LIPASE, AMYLASE in the last 8760 hours. No results for  input(s): AMMONIA in the last 8760 hours. CBC:  Recent Labs  10/30/15 10/31/15 01/14/16 01/15/16 06/30/16  WBC 6.0 4.3 7.0 6.4 4.8  NEUTROABS 3,660 2,623  --  4,480  --   HGB 9.6* 9.0* 12.0* 9.1* 9.4*  HCT 31* 29* 37* 29* 32*  PLT 129* 148* 182 173 155   TSH: No results for input(s): TSH in the last 8760 hours. A1C: Lab Results  Component Value Date   HGBA1C 5.6 12/21/2015   Lipid Panel: No results for input(s): CHOL, HDL, LDLCALC, TRIG, CHOLHDL, LDLDIRECT in the last 8760 hours.  Assessment/Plan 1. Acute cystitis without hematuria -Will send urine for UA C& S due to c/o burning with urination.  -Encourage staff to offer increased fluids  2. FTT (failure to thrive) in adult -15lb weightloss in one month despite to use of Remeron and supplements -Encouraged staff to continue respite feeding -CMP shows low albumin which he is receiving supplements  -Anticipate further weight loss with disease progression of dementia  -TSH normal   3. Late onset Alzheimer's disease with behavioral disturbance -Advanced dementia with significant weight loss noted over the last couple of months. -increase in fatigue with decrease in appetite due to worsening of dementia.   4. Dysphagia- -Upper airway congestion most likely due to advanced dementia and unable to effectively clear upper airway. No s/s for infection however will get CXR to r/o PNA -Continues to be seen by ST -Continues respite feedings  5. Cough.  Will get chest xray as above.  Will have staff use dextromethorphan/guaifenesin elixer 10 cc q 4 hour x1 week and then PRN   Kate Sweetman K. Biagio Borg  [facility] & Adult  Medicine [phone] 8 am - 5 pm) [phone] (after hours)

## 2016-08-09 ENCOUNTER — Encounter: Payer: Self-pay | Admitting: Internal Medicine

## 2016-08-09 ENCOUNTER — Non-Acute Institutional Stay (SKILLED_NURSING_FACILITY): Payer: Medicare Other | Admitting: Internal Medicine

## 2016-08-09 DIAGNOSIS — J189 Pneumonia, unspecified organism: Secondary | ICD-10-CM | POA: Diagnosis not present

## 2016-08-09 NOTE — Progress Notes (Signed)
   Facility Location: [facility]  Room Number: 305-B  Code Status: DNR  This is a nursing facility follow up for specific acute issue of ammonia and sacral pressure ulcer.  Interim medical record and care [facility] was updated with review of diagnostic studies and change in clinical status since last visit were documented.  HPI: The patient was noted to be coughing, the on-call care provider ordered a chest x-ray which suggested patchy pneumonia . Levaquin was initiated for 7 days and DuoNeb nebs ordered. The patient is unable give any history due to deafness and dementia. He denies any upper or lower respiratory tract infection symptoms. He is unaware of the pressure ulcer. Wound care is involved in its assessment and treatment.  Review of systems: His repetitive responses "I'm doing okay" Constitutional: No fever,significant weight change, fatigue  ENT/mouth: No nasal congestion,  purulent discharge, earache,change in hearing ,sore throat  Cardiovascular: No chest pain, palpitations,paroxysmal nocturnal dyspnea  Respiratory: No cough, sputum production,hemoptysis, DOE   Allergy/immunology: No itchy/ watery eyes, significant sneezing, urticaria, angioedema  Physical exam:  Pertinent or positive findings: The patient appears younger than his age. Hair is white and disheveled. Patient is profoundly deaf. Extremely poor dentition with erosions to & below gumline. Breath sounds are decreased & heart sounds are distant. He has marked atrophy of the limbs. Pedal pulses are decreased.  General appearance:Adequately nourished; no acute distress , increased work of breathing is present.   Lymphatic: No lymphadenopathy about the head, neck, axilla . Eyes: No conjunctival inflammation or lid edema is present. There is no scleral icterus. Ears:  External ear exam shows no significant lesions or deformities.   Nose:  External nasal  examination shows no deformity or inflammation. Nasal mucosa are pink and moist without lesions ,exudates Oral exam: lips are healthy appearing.There is no oropharyngeal erythema or exudate . Neck:  No thyromegaly, masses, tenderness noted.    Heart:  Normal rate; without gallop, murmur, click, rub .  Lungs:without wheezes, rhonchi,rales , rubs. Abdomen:Bowel sounds are normal. Abdomen is soft and nontender with no organomegaly, hernias,masses. Extremities:  No cyanosis, clubbing  Strength equally decreased in upper & lower extremities  Balance,Rhomberg,finger to nose testing could not be completed due to clinical state Skin: Warm & dry w/o tenting. No significant rash.  #1 pneumonia, complete 7 days of Levaquin and pulmonary toilet. DO NOT RESUSCITATE

## 2016-08-10 NOTE — Patient Instructions (Signed)
See assessment and plan under each diagnosis acutely for this visit  

## 2016-08-26 DEATH — deceased

## 2017-09-21 IMAGING — RF DG SWALLOWING FUNCTION
11 of 15 series · 19 of 24 positions shown · non-contrast
Comparison: None.

CLINICAL DATA: Dysphagia.

EXAM:
MODIFIED BARIUM SWALLOW
TECHNIQUE: Different consistencies of barium were administered orally to the
patient by the Speech Pathologist. Imaging of the pharynx was
performed in the lateral projection.
FLUOROSCOPY TIME:  Fluoroscopy Time:  4 minutes, 18 seconds
Radiation Exposure Index (if provided by the fluoroscopic device):
22 mGy
Number of Acquired Spot Images: 0

[Series 1: cp_standard · 0.37mm/px · 2 of 127 frames shown (1 of 11)]
[frame 5/127]
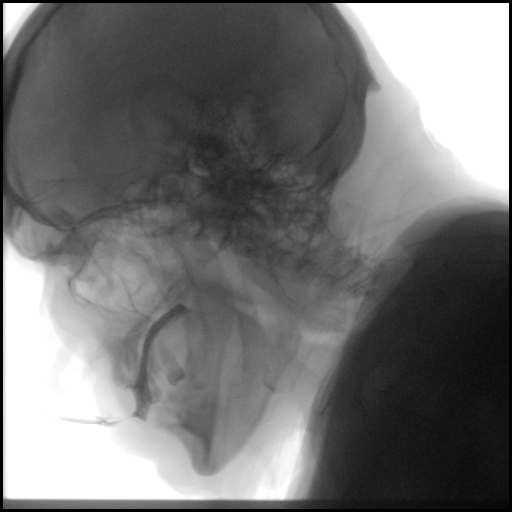
[frame 108/127]
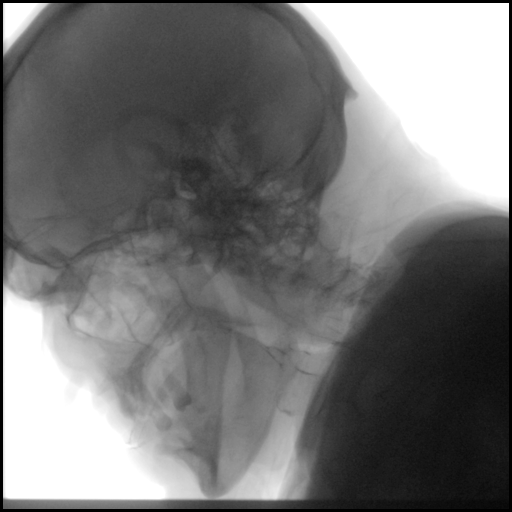

[Series 3: cp_standard · 0.37mm/px · 2 of 78 frames shown (2 of 11)]
[frame 12/78]
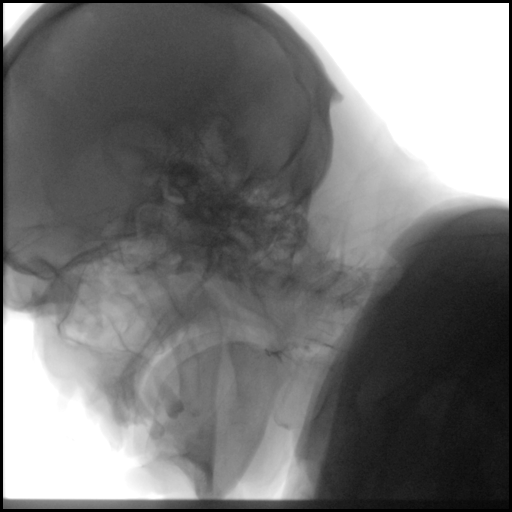
[frame 40/78]
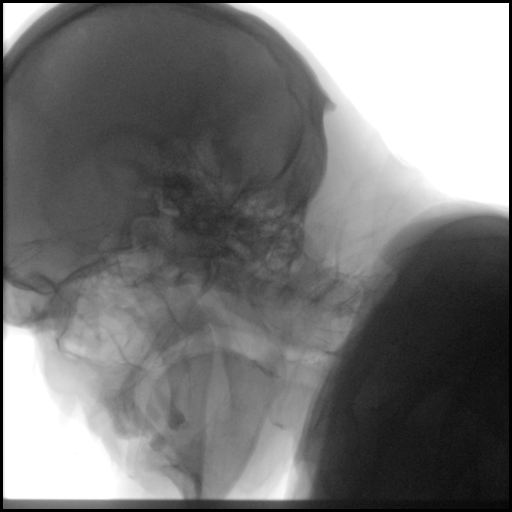

[Series 4: cp_standard · 0.37mm/px · 2 of 233 frames shown (3 of 11)]
[frame 35/233]
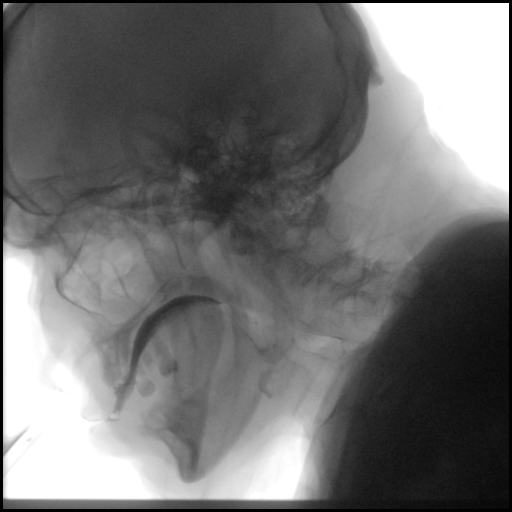
[frame 199/233]
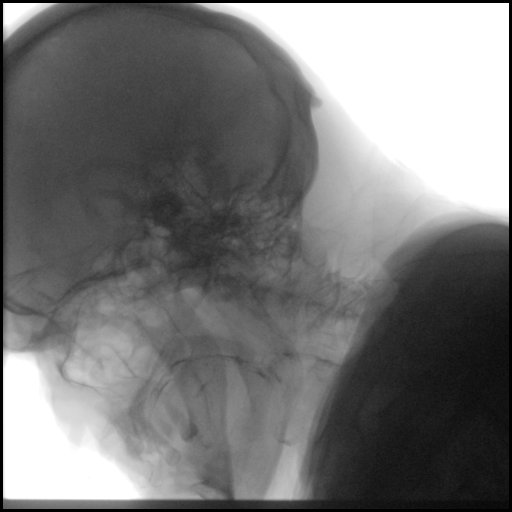

[Series 6: cp_standard · 0.37mm/px · 2 of 91 frames shown (4 of 11)]
[frame 46/91]
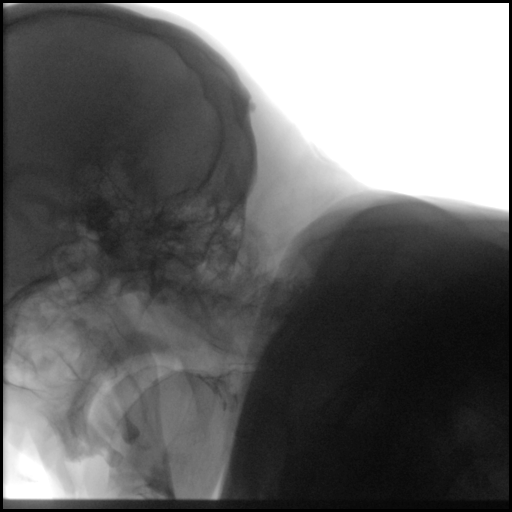
[frame 91/91]
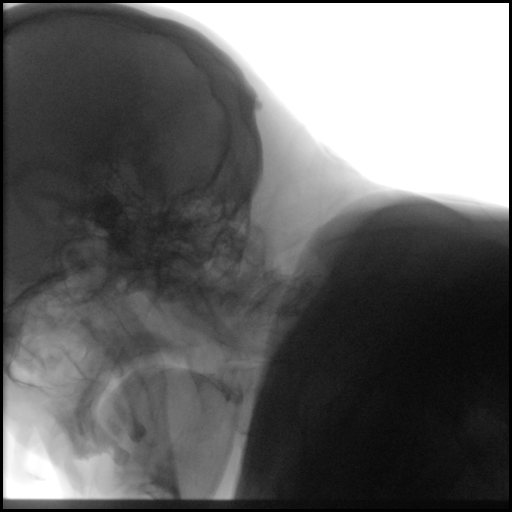

[Series 7: cp_standard · 0.37mm/px · 1 of 183 frames shown (5 of 11)]
[frame 156/183]
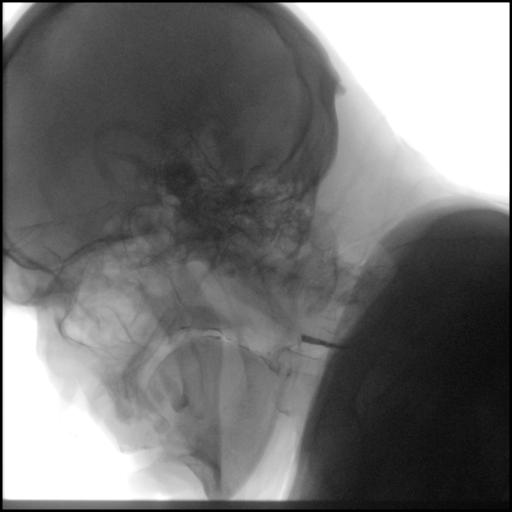

[Series 8: cp_standard · 0.37mm/px · 1 of 544 frames shown (6 of 11)]
[frame 463/544]
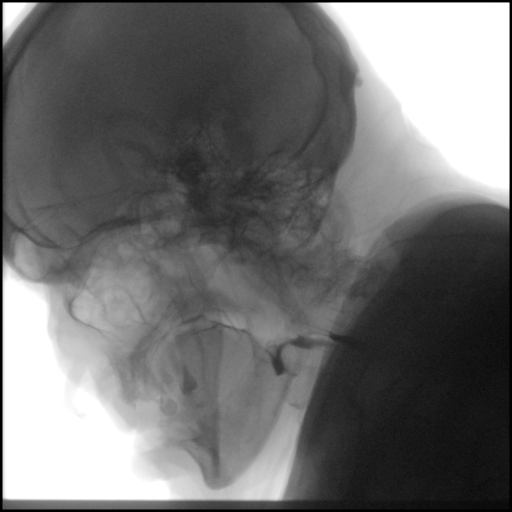

[Series 9: cp_standard · 0.37mm/px · 1 of 71 frames shown (7 of 11)]
[frame 36/71]
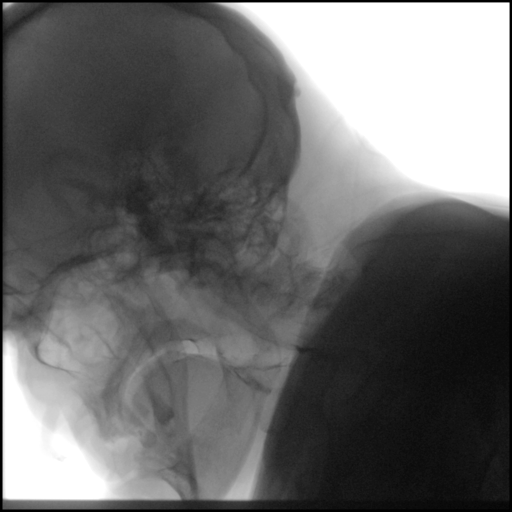

[Series 10: cp_standard · 0.37mm/px · 2 of 144 frames shown (8 of 11)]
[frame 1/144]
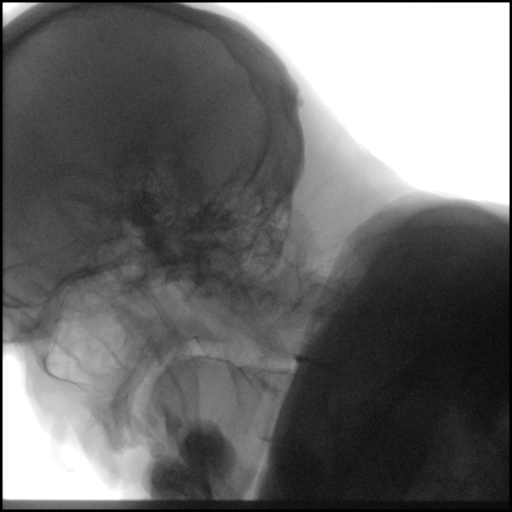
[frame 73/144]
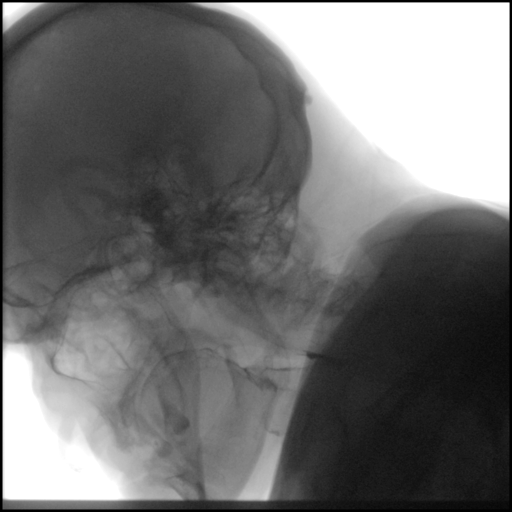

[Series 12: cp_standard · 0.37mm/px · 2 of 207 frames shown (9 of 11)]
[frame 32/207]
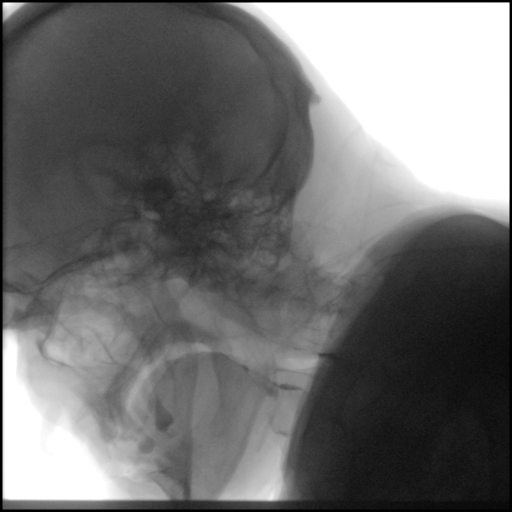
[frame 104/207]
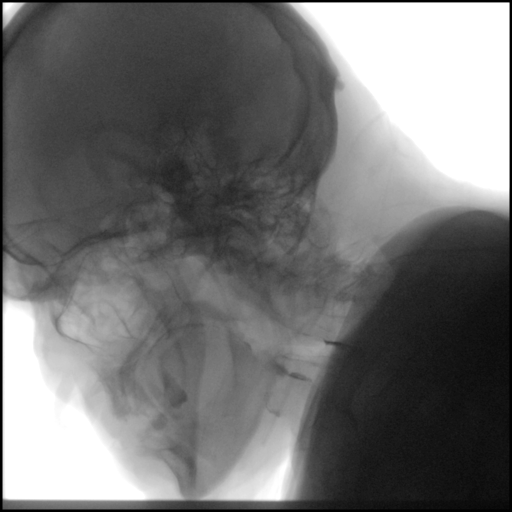

[Series 13: cp_standard · 0.37mm/px · 2 of 121 frames shown (10 of 11)]
[frame 32/121]
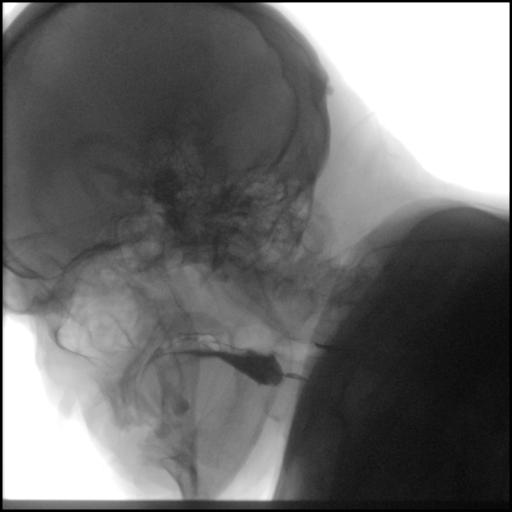
[frame 103/121]
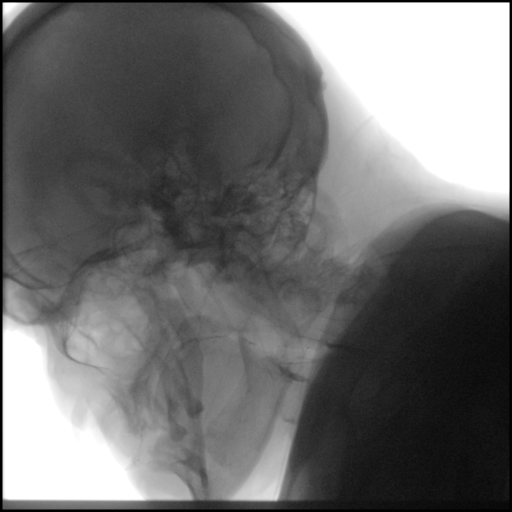

[Series 15: cp_standard · 0.37mm/px · 2 of 142 frames shown (11 of 11)]
[frame 22/142]
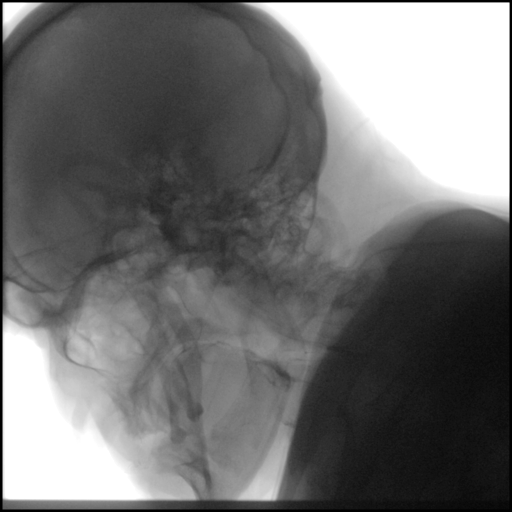
[frame 121/142]
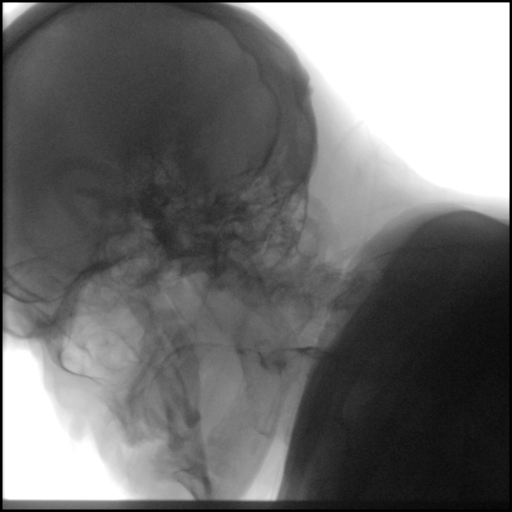

[19 of 24 positions shown; findings below may reference images not displayed]

FINDINGS: Nectar thick liquid- delayed swallowing trigger. Several episodes of
flash penetration. One episode of frank tracheal aspiration when
drinking from a straw, later straw swallows which were smaller in
volume did not show aspiration. Vallecular pooling.

Berthilde?Fobl delayed swallow trigger with some vallecular retention.
Clumping of the tongue with delayed oral transit. One episode of
flash penetration.

Berthilde?Daw with cracker- delayed oral transit/ poor oral control. Single
episode of flash penetration.
IMPRESSION: 1. Generally findings of delayed swallowing and poor oral control
with vallecular retention. There were multiple episodes of laryngeal
penetration and a single episode of frank tracheal aspiration when
drinking nectar thick liquid from a straw.

Please refer to the Speech Pathologists report for complete details
and recommendations.
